# Patient Record
Sex: Female | Born: 1955 | Race: White | Hispanic: No | Marital: Married | Smoking: Former smoker
Health system: Southern US, Community
[De-identification: ages and names within clinical notes are randomized; demographics above are authoritative.]

## PROBLEM LIST (undated history)

## (undated) DIAGNOSIS — T79A0XS Compartment syndrome, unspecified, sequela: Secondary | ICD-10-CM

## (undated) DIAGNOSIS — F419 Anxiety disorder, unspecified: Secondary | ICD-10-CM

## (undated) DIAGNOSIS — R1312 Dysphagia, oropharyngeal phase: Secondary | ICD-10-CM

## (undated) DIAGNOSIS — N183 Chronic kidney disease, stage 3 unspecified: Secondary | ICD-10-CM

## (undated) DIAGNOSIS — D62 Acute posthemorrhagic anemia: Secondary | ICD-10-CM

## (undated) DIAGNOSIS — F329 Major depressive disorder, single episode, unspecified: Secondary | ICD-10-CM

## (undated) DIAGNOSIS — D638 Anemia in other chronic diseases classified elsewhere: Secondary | ICD-10-CM

## (undated) DIAGNOSIS — E119 Type 2 diabetes mellitus without complications: Secondary | ICD-10-CM

## (undated) DIAGNOSIS — I959 Hypotension, unspecified: Secondary | ICD-10-CM

## (undated) DIAGNOSIS — F4310 Post-traumatic stress disorder, unspecified: Secondary | ICD-10-CM

## (undated) DIAGNOSIS — J449 Chronic obstructive pulmonary disease, unspecified: Secondary | ICD-10-CM

## (undated) DIAGNOSIS — A4902 Methicillin resistant Staphylococcus aureus infection, unspecified site: Secondary | ICD-10-CM

## (undated) DIAGNOSIS — L89609 Pressure ulcer of unspecified heel, unspecified stage: Secondary | ICD-10-CM

## (undated) DIAGNOSIS — E875 Hyperkalemia: Secondary | ICD-10-CM

## (undated) DIAGNOSIS — K219 Gastro-esophageal reflux disease without esophagitis: Secondary | ICD-10-CM

## (undated) DIAGNOSIS — J9620 Acute and chronic respiratory failure, unspecified whether with hypoxia or hypercapnia: Secondary | ICD-10-CM

## (undated) DIAGNOSIS — R001 Bradycardia, unspecified: Secondary | ICD-10-CM

## (undated) DIAGNOSIS — Z9911 Dependence on respirator [ventilator] status: Secondary | ICD-10-CM

## (undated) DIAGNOSIS — G061 Intraspinal abscess and granuloma: Secondary | ICD-10-CM

## (undated) DIAGNOSIS — J45909 Unspecified asthma, uncomplicated: Secondary | ICD-10-CM

## (undated) DIAGNOSIS — G8250 Quadriplegia, unspecified: Secondary | ICD-10-CM

## (undated) DIAGNOSIS — Z950 Presence of cardiac pacemaker: Secondary | ICD-10-CM

## (undated) HISTORY — PX: PEG TUBE PLACEMENT: SUR1034

## (undated) HISTORY — PX: TRACHEOSTOMY: SUR1362

---

## 2019-12-23 ENCOUNTER — Encounter (HOSPITAL_COMMUNITY): Payer: Self-pay

## 2019-12-23 ENCOUNTER — Inpatient Hospital Stay (HOSPITAL_COMMUNITY)
Admission: EM | Admit: 2019-12-23 | Discharge: 2020-01-07 | DRG: 314 | Disposition: A | Discharge status: Skilled Nursing Facility | Payer: Medicare PPO | Source: Skilled Nursing Facility | Attending: Internal Medicine | Admitting: Internal Medicine

## 2019-12-23 ENCOUNTER — Emergency Department (HOSPITAL_COMMUNITY): Payer: Medicare PPO

## 2019-12-23 DIAGNOSIS — G8250 Quadriplegia, unspecified: Secondary | ICD-10-CM | POA: Diagnosis present

## 2019-12-23 DIAGNOSIS — Z91040 Latex allergy status: Secondary | ICD-10-CM | POA: Diagnosis not present

## 2019-12-23 DIAGNOSIS — E11649 Type 2 diabetes mellitus with hypoglycemia without coma: Secondary | ICD-10-CM | POA: Diagnosis not present

## 2019-12-23 DIAGNOSIS — B379 Candidiasis, unspecified: Secondary | ICD-10-CM | POA: Diagnosis not present

## 2019-12-23 DIAGNOSIS — N390 Urinary tract infection, site not specified: Secondary | ICD-10-CM | POA: Diagnosis present

## 2019-12-23 DIAGNOSIS — B370 Candidal stomatitis: Secondary | ICD-10-CM | POA: Diagnosis present

## 2019-12-23 DIAGNOSIS — Z9181 History of falling: Secondary | ICD-10-CM

## 2019-12-23 DIAGNOSIS — A4151 Sepsis due to Escherichia coli [E. coli]: Secondary | ICD-10-CM | POA: Diagnosis present

## 2019-12-23 DIAGNOSIS — R1312 Dysphagia, oropharyngeal phase: Secondary | ICD-10-CM | POA: Diagnosis not present

## 2019-12-23 DIAGNOSIS — J9610 Chronic respiratory failure, unspecified whether with hypoxia or hypercapnia: Secondary | ICD-10-CM | POA: Diagnosis not present

## 2019-12-23 DIAGNOSIS — D649 Anemia, unspecified: Secondary | ICD-10-CM | POA: Diagnosis present

## 2019-12-23 DIAGNOSIS — Z950 Presence of cardiac pacemaker: Secondary | ICD-10-CM | POA: Diagnosis present

## 2019-12-23 DIAGNOSIS — D703 Neutropenia due to infection: Secondary | ICD-10-CM | POA: Diagnosis present

## 2019-12-23 DIAGNOSIS — Z20822 Contact with and (suspected) exposure to covid-19: Secondary | ICD-10-CM | POA: Diagnosis present

## 2019-12-23 DIAGNOSIS — G8251 Quadriplegia, C1-C4 complete: Secondary | ICD-10-CM | POA: Diagnosis not present

## 2019-12-23 DIAGNOSIS — J15212 Pneumonia due to Methicillin resistant Staphylococcus aureus: Secondary | ICD-10-CM | POA: Diagnosis present

## 2019-12-23 DIAGNOSIS — R601 Generalized edema: Secondary | ICD-10-CM | POA: Diagnosis not present

## 2019-12-23 DIAGNOSIS — Z931 Gastrostomy status: Secondary | ICD-10-CM

## 2019-12-23 DIAGNOSIS — Z79891 Long term (current) use of opiate analgesic: Secondary | ICD-10-CM

## 2019-12-23 DIAGNOSIS — B9562 Methicillin resistant Staphylococcus aureus infection as the cause of diseases classified elsewhere: Secondary | ICD-10-CM | POA: Diagnosis not present

## 2019-12-23 DIAGNOSIS — Z430 Encounter for attention to tracheostomy: Secondary | ICD-10-CM | POA: Diagnosis not present

## 2019-12-23 DIAGNOSIS — I82629 Acute embolism and thrombosis of deep veins of unspecified upper extremity: Secondary | ICD-10-CM

## 2019-12-23 DIAGNOSIS — Z1612 Extended spectrum beta lactamase (ESBL) resistance: Secondary | ICD-10-CM | POA: Diagnosis present

## 2019-12-23 DIAGNOSIS — Z5329 Procedure and treatment not carried out because of patient's decision for other reasons: Secondary | ICD-10-CM | POA: Diagnosis not present

## 2019-12-23 DIAGNOSIS — Y712 Prosthetic and other implants, materials and accessory cardiovascular devices associated with adverse incidents: Secondary | ICD-10-CM | POA: Diagnosis present

## 2019-12-23 DIAGNOSIS — Z8673 Personal history of transient ischemic attack (TIA), and cerebral infarction without residual deficits: Secondary | ICD-10-CM

## 2019-12-23 DIAGNOSIS — T82868A Thrombosis of vascular prosthetic devices, implants and grafts, initial encounter: Secondary | ICD-10-CM | POA: Diagnosis not present

## 2019-12-23 DIAGNOSIS — I82622 Acute embolism and thrombosis of deep veins of left upper extremity: Secondary | ICD-10-CM | POA: Diagnosis not present

## 2019-12-23 DIAGNOSIS — Y95 Nosocomial condition: Secondary | ICD-10-CM | POA: Diagnosis present

## 2019-12-23 DIAGNOSIS — Z87891 Personal history of nicotine dependence: Secondary | ICD-10-CM

## 2019-12-23 DIAGNOSIS — Z930 Tracheostomy status: Secondary | ICD-10-CM | POA: Diagnosis not present

## 2019-12-23 DIAGNOSIS — Z960 Presence of urogenital implants: Secondary | ICD-10-CM | POA: Diagnosis not present

## 2019-12-23 DIAGNOSIS — E870 Hyperosmolality and hypernatremia: Secondary | ICD-10-CM | POA: Diagnosis not present

## 2019-12-23 DIAGNOSIS — Z880 Allergy status to penicillin: Secondary | ICD-10-CM

## 2019-12-23 DIAGNOSIS — J9621 Acute and chronic respiratory failure with hypoxia: Secondary | ICD-10-CM | POA: Diagnosis present

## 2019-12-23 DIAGNOSIS — T80211A Bloodstream infection due to central venous catheter, initial encounter: Secondary | ICD-10-CM | POA: Diagnosis not present

## 2019-12-23 DIAGNOSIS — E0781 Sick-euthyroid syndrome: Secondary | ICD-10-CM | POA: Diagnosis present

## 2019-12-23 DIAGNOSIS — B9620 Unspecified Escherichia coli [E. coli] as the cause of diseases classified elsewhere: Secondary | ICD-10-CM | POA: Diagnosis not present

## 2019-12-23 DIAGNOSIS — Y92230 Patient room in hospital as the place of occurrence of the external cause: Secondary | ICD-10-CM | POA: Diagnosis not present

## 2019-12-23 DIAGNOSIS — Z515 Encounter for palliative care: Secondary | ICD-10-CM

## 2019-12-23 DIAGNOSIS — E1122 Type 2 diabetes mellitus with diabetic chronic kidney disease: Secondary | ICD-10-CM | POA: Diagnosis present

## 2019-12-23 DIAGNOSIS — L89623 Pressure ulcer of left heel, stage 3: Secondary | ICD-10-CM | POA: Diagnosis present

## 2019-12-23 DIAGNOSIS — K219 Gastro-esophageal reflux disease without esophagitis: Secondary | ICD-10-CM | POA: Diagnosis present

## 2019-12-23 DIAGNOSIS — Z8619 Personal history of other infectious and parasitic diseases: Secondary | ICD-10-CM

## 2019-12-23 DIAGNOSIS — E039 Hypothyroidism, unspecified: Secondary | ICD-10-CM | POA: Diagnosis present

## 2019-12-23 DIAGNOSIS — A4102 Sepsis due to Methicillin resistant Staphylococcus aureus: Secondary | ICD-10-CM | POA: Diagnosis present

## 2019-12-23 DIAGNOSIS — Z8661 Personal history of infections of the central nervous system: Secondary | ICD-10-CM

## 2019-12-23 DIAGNOSIS — R8271 Bacteriuria: Secondary | ICD-10-CM | POA: Diagnosis not present

## 2019-12-23 DIAGNOSIS — K0889 Other specified disorders of teeth and supporting structures: Secondary | ICD-10-CM | POA: Diagnosis present

## 2019-12-23 DIAGNOSIS — B377 Candidal sepsis: Secondary | ICD-10-CM | POA: Diagnosis not present

## 2019-12-23 DIAGNOSIS — N183 Chronic kidney disease, stage 3 unspecified: Secondary | ICD-10-CM | POA: Diagnosis present

## 2019-12-23 DIAGNOSIS — R7881 Bacteremia: Secondary | ICD-10-CM | POA: Diagnosis not present

## 2019-12-23 DIAGNOSIS — J440 Chronic obstructive pulmonary disease with acute lower respiratory infection: Secondary | ICD-10-CM | POA: Diagnosis present

## 2019-12-23 DIAGNOSIS — E119 Type 2 diabetes mellitus without complications: Secondary | ICD-10-CM | POA: Diagnosis not present

## 2019-12-23 DIAGNOSIS — R609 Edema, unspecified: Secondary | ICD-10-CM

## 2019-12-23 DIAGNOSIS — G061 Intraspinal abscess and granuloma: Secondary | ICD-10-CM | POA: Diagnosis not present

## 2019-12-23 DIAGNOSIS — R600 Localized edema: Secondary | ICD-10-CM | POA: Diagnosis present

## 2019-12-23 DIAGNOSIS — D6489 Other specified anemias: Secondary | ICD-10-CM | POA: Diagnosis present

## 2019-12-23 DIAGNOSIS — F419 Anxiety disorder, unspecified: Secondary | ICD-10-CM | POA: Diagnosis not present

## 2019-12-23 DIAGNOSIS — Z7951 Long term (current) use of inhaled steroids: Secondary | ICD-10-CM

## 2019-12-23 DIAGNOSIS — L989 Disorder of the skin and subcutaneous tissue, unspecified: Secondary | ICD-10-CM | POA: Diagnosis present

## 2019-12-23 DIAGNOSIS — J9601 Acute respiratory failure with hypoxia: Secondary | ICD-10-CM | POA: Diagnosis not present

## 2019-12-23 DIAGNOSIS — F4310 Post-traumatic stress disorder, unspecified: Secondary | ICD-10-CM | POA: Diagnosis present

## 2019-12-23 DIAGNOSIS — F329 Major depressive disorder, single episode, unspecified: Secondary | ICD-10-CM | POA: Diagnosis present

## 2019-12-23 DIAGNOSIS — A499 Bacterial infection, unspecified: Secondary | ICD-10-CM | POA: Diagnosis not present

## 2019-12-23 DIAGNOSIS — Z7401 Bed confinement status: Secondary | ICD-10-CM

## 2019-12-23 DIAGNOSIS — I5033 Acute on chronic diastolic (congestive) heart failure: Secondary | ICD-10-CM | POA: Diagnosis present

## 2019-12-23 DIAGNOSIS — J9611 Chronic respiratory failure with hypoxia: Secondary | ICD-10-CM | POA: Diagnosis not present

## 2019-12-23 DIAGNOSIS — M7989 Other specified soft tissue disorders: Secondary | ICD-10-CM | POA: Diagnosis not present

## 2019-12-23 DIAGNOSIS — N179 Acute kidney failure, unspecified: Secondary | ICD-10-CM | POA: Diagnosis present

## 2019-12-23 DIAGNOSIS — Z751 Person awaiting admission to adequate facility elsewhere: Secondary | ICD-10-CM

## 2019-12-23 DIAGNOSIS — A4159 Other Gram-negative sepsis: Secondary | ICD-10-CM | POA: Diagnosis present

## 2019-12-23 DIAGNOSIS — E46 Unspecified protein-calorie malnutrition: Secondary | ICD-10-CM | POA: Diagnosis present

## 2019-12-23 DIAGNOSIS — Z794 Long term (current) use of insulin: Secondary | ICD-10-CM

## 2019-12-23 DIAGNOSIS — Z981 Arthrodesis status: Secondary | ICD-10-CM | POA: Diagnosis not present

## 2019-12-23 DIAGNOSIS — R0600 Dyspnea, unspecified: Secondary | ICD-10-CM | POA: Diagnosis not present

## 2019-12-23 DIAGNOSIS — Z9911 Dependence on respirator [ventilator] status: Secondary | ICD-10-CM

## 2019-12-23 DIAGNOSIS — Z79899 Other long term (current) drug therapy: Secondary | ICD-10-CM

## 2019-12-23 DIAGNOSIS — Z95828 Presence of other vascular implants and grafts: Secondary | ICD-10-CM | POA: Diagnosis not present

## 2019-12-23 DIAGNOSIS — E1165 Type 2 diabetes mellitus with hyperglycemia: Secondary | ICD-10-CM

## 2019-12-23 DIAGNOSIS — T801XXA Vascular complications following infusion, transfusion and therapeutic injection, initial encounter: Secondary | ICD-10-CM

## 2019-12-23 DIAGNOSIS — D709 Neutropenia, unspecified: Secondary | ICD-10-CM | POA: Diagnosis present

## 2019-12-23 DIAGNOSIS — I493 Ventricular premature depolarization: Secondary | ICD-10-CM | POA: Diagnosis present

## 2019-12-23 DIAGNOSIS — Z91048 Other nonmedicinal substance allergy status: Secondary | ICD-10-CM

## 2019-12-23 DIAGNOSIS — R0989 Other specified symptoms and signs involving the circulatory and respiratory systems: Secondary | ICD-10-CM

## 2019-12-23 DIAGNOSIS — G8220 Paraplegia, unspecified: Secondary | ICD-10-CM | POA: Diagnosis not present

## 2019-12-23 DIAGNOSIS — Z885 Allergy status to narcotic agent status: Secondary | ICD-10-CM

## 2019-12-23 DIAGNOSIS — Z4659 Encounter for fitting and adjustment of other gastrointestinal appliance and device: Secondary | ICD-10-CM

## 2019-12-23 DIAGNOSIS — R680 Hypothermia, not associated with low environmental temperature: Secondary | ICD-10-CM | POA: Diagnosis not present

## 2019-12-23 DIAGNOSIS — Z887 Allergy status to serum and vaccine status: Secondary | ICD-10-CM

## 2019-12-23 DIAGNOSIS — D72819 Decreased white blood cell count, unspecified: Secondary | ICD-10-CM | POA: Diagnosis not present

## 2019-12-23 DIAGNOSIS — D759 Disease of blood and blood-forming organs, unspecified: Secondary | ICD-10-CM

## 2019-12-23 HISTORY — DX: Dependence on respirator (ventilator) status: Z99.11

## 2019-12-23 HISTORY — DX: Acute and chronic respiratory failure, unspecified whether with hypoxia or hypercapnia: J96.20

## 2019-12-23 HISTORY — DX: Pressure ulcer of unspecified heel, unspecified stage: L89.609

## 2019-12-23 HISTORY — DX: Anxiety disorder, unspecified: F41.9

## 2019-12-23 HISTORY — DX: Bradycardia, unspecified: R00.1

## 2019-12-23 HISTORY — DX: Anemia in other chronic diseases classified elsewhere: D63.8

## 2019-12-23 HISTORY — DX: Chronic kidney disease, stage 3 unspecified: N18.30

## 2019-12-23 HISTORY — DX: Unspecified asthma, uncomplicated: J45.909

## 2019-12-23 HISTORY — DX: Chronic obstructive pulmonary disease, unspecified: J44.9

## 2019-12-23 HISTORY — DX: Dysphagia, oropharyngeal phase: R13.12

## 2019-12-23 HISTORY — DX: Quadriplegia, unspecified: G82.50

## 2019-12-23 HISTORY — DX: Intraspinal abscess and granuloma: G06.1

## 2019-12-23 HISTORY — DX: Compartment syndrome, unspecified, sequela: T79.A0XS

## 2019-12-23 HISTORY — DX: Post-traumatic stress disorder, unspecified: F43.10

## 2019-12-23 HISTORY — DX: Methicillin resistant Staphylococcus aureus infection, unspecified site: A49.02

## 2019-12-23 HISTORY — DX: Type 2 diabetes mellitus without complications: E11.9

## 2019-12-23 HISTORY — DX: Gastro-esophageal reflux disease without esophagitis: K21.9

## 2019-12-23 HISTORY — DX: Hypotension, unspecified: I95.9

## 2019-12-23 HISTORY — DX: Presence of cardiac pacemaker: Z95.0

## 2019-12-23 HISTORY — DX: Hyperkalemia: E87.5

## 2019-12-23 HISTORY — DX: Major depressive disorder, single episode, unspecified: F32.9

## 2019-12-23 HISTORY — DX: Acute posthemorrhagic anemia: D62

## 2019-12-23 LAB — PROTIME-INR
INR: 1.2 (ref 0.8–1.2)
Prothrombin Time: 15.1 seconds (ref 11.4–15.2)

## 2019-12-23 LAB — CBC WITH DIFFERENTIAL/PLATELET
Abs Immature Granulocytes: 0 10*3/uL (ref 0.00–0.07)
Basophils Absolute: 0 10*3/uL (ref 0.0–0.1)
Basophils Relative: 2 %
Eosinophils Absolute: 0.3 10*3/uL (ref 0.0–0.5)
Eosinophils Relative: 40 %
HCT: 20.9 % — ABNORMAL LOW (ref 36.0–46.0)
Hemoglobin: 6.2 g/dL — CL (ref 12.0–15.0)
Lymphocytes Relative: 58 %
Lymphs Abs: 0.5 10*3/uL — ABNORMAL LOW (ref 0.7–4.0)
MCH: 29.7 pg (ref 26.0–34.0)
MCHC: 29.7 g/dL — ABNORMAL LOW (ref 30.0–36.0)
MCV: 100 fL (ref 80.0–100.0)
Monocytes Absolute: 0 10*3/uL — ABNORMAL LOW (ref 0.1–1.0)
Monocytes Relative: 0 %
Neutro Abs: 0 10*3/uL — ABNORMAL LOW (ref 1.7–7.7)
Neutrophils Relative %: 0 %
Platelets: 255 10*3/uL (ref 150–400)
RBC: 2.09 MIL/uL — ABNORMAL LOW (ref 3.87–5.11)
RDW: 18.6 % — ABNORMAL HIGH (ref 11.5–15.5)
WBC: 0.8 10*3/uL — CL (ref 4.0–10.5)
nRBC: 0 % (ref 0.0–0.2)
nRBC: 2 /100 WBC — ABNORMAL HIGH

## 2019-12-23 LAB — CBG MONITORING, ED: Glucose-Capillary: 99 mg/dL (ref 70–99)

## 2019-12-23 LAB — URINALYSIS, ROUTINE W REFLEX MICROSCOPIC
Bilirubin Urine: NEGATIVE
Glucose, UA: NEGATIVE mg/dL
Hgb urine dipstick: NEGATIVE
Ketones, ur: NEGATIVE mg/dL
Leukocytes,Ua: NEGATIVE
Nitrite: NEGATIVE
Protein, ur: 100 mg/dL — AB
Specific Gravity, Urine: 1.015 (ref 1.005–1.030)
pH: 9 — ABNORMAL HIGH (ref 5.0–8.0)

## 2019-12-23 LAB — POCT I-STAT 7, (LYTES, BLD GAS, ICA,H+H)
Acid-Base Excess: 6 mmol/L — ABNORMAL HIGH (ref 0.0–2.0)
Bicarbonate: 30.7 mmol/L — ABNORMAL HIGH (ref 20.0–28.0)
Calcium, Ion: 1.28 mmol/L (ref 1.15–1.40)
HCT: 23 % — ABNORMAL LOW (ref 36.0–46.0)
Hemoglobin: 7.8 g/dL — ABNORMAL LOW (ref 12.0–15.0)
O2 Saturation: 96 %
Patient temperature: 98.3
Potassium: 4.9 mmol/L (ref 3.5–5.1)
Sodium: 144 mmol/L (ref 135–145)
TCO2: 32 mmol/L (ref 22–32)
pCO2 arterial: 43.6 mmHg (ref 32.0–48.0)
pH, Arterial: 7.455 — ABNORMAL HIGH (ref 7.350–7.450)
pO2, Arterial: 80 mmHg — ABNORMAL LOW (ref 83.0–108.0)

## 2019-12-23 LAB — COMPREHENSIVE METABOLIC PANEL
ALT: 40 U/L (ref 0–44)
AST: 16 U/L (ref 15–41)
Albumin: 1.2 g/dL — ABNORMAL LOW (ref 3.5–5.0)
Alkaline Phosphatase: 247 U/L — ABNORMAL HIGH (ref 38–126)
Anion gap: 6 (ref 5–15)
BUN: 69 mg/dL — ABNORMAL HIGH (ref 8–23)
CO2: 29 mmol/L (ref 22–32)
Calcium: 8.6 mg/dL — ABNORMAL LOW (ref 8.9–10.3)
Chloride: 107 mmol/L (ref 98–111)
Creatinine, Ser: 1.24 mg/dL — ABNORMAL HIGH (ref 0.44–1.00)
GFR calc Af Amer: 53 mL/min — ABNORMAL LOW (ref >60)
GFR calc non Af Amer: 46 mL/min — ABNORMAL LOW (ref >60)
Glucose, Bld: 177 mg/dL — ABNORMAL HIGH (ref 70–99)
Potassium: 4.6 mmol/L (ref 3.5–5.1)
Sodium: 142 mmol/L (ref 135–145)
Total Bilirubin: 0.6 mg/dL (ref 0.3–1.2)
Total Protein: 6.8 g/dL (ref 6.5–8.1)

## 2019-12-23 LAB — PREPARE RBC (CROSSMATCH)

## 2019-12-23 LAB — T4, FREE: Free T4: 0.66 ng/dL (ref 0.61–1.12)

## 2019-12-23 LAB — RESPIRATORY PANEL BY RT PCR (FLU A&B, COVID)
Influenza A by PCR: NEGATIVE
Influenza B by PCR: NEGATIVE
SARS Coronavirus 2 by RT PCR: NEGATIVE

## 2019-12-23 LAB — TSH: TSH: 24.573 u[IU]/mL — ABNORMAL HIGH (ref 0.350–4.500)

## 2019-12-23 LAB — POC SARS CORONAVIRUS 2 AG -  ED: SARS Coronavirus 2 Ag: NEGATIVE

## 2019-12-23 LAB — APTT: aPTT: 36 seconds (ref 24–36)

## 2019-12-23 LAB — TROPONIN I (HIGH SENSITIVITY): Troponin I (High Sensitivity): 5 ng/L (ref <18)

## 2019-12-23 LAB — LACTIC ACID, PLASMA: Lactic Acid, Venous: 1 mmol/L (ref 0.5–1.9)

## 2019-12-23 LAB — POC OCCULT BLOOD, ED: Fecal Occult Bld: NEGATIVE

## 2019-12-23 MED ORDER — SODIUM CHLORIDE 0.9% IV SOLUTION: Freq: Once | INTRAVENOUS | Status: DC

## 2019-12-23 MED ORDER — INSULIN ASPART 100 UNIT/ML ~~LOC~~ SOLN
0.0000 [IU] | SUBCUTANEOUS | Status: DC
  Administered 2019-12-26 (×3): 2 [IU] via SUBCUTANEOUS
  Administered 2019-12-26: 1 [IU] via SUBCUTANEOUS
  Administered 2019-12-26 (×2): 2 [IU] via SUBCUTANEOUS
  Administered 2019-12-26: 1 [IU] via SUBCUTANEOUS
  Administered 2019-12-27: 2 [IU] via SUBCUTANEOUS
  Administered 2019-12-27: 3 [IU] via SUBCUTANEOUS
  Administered 2019-12-27: 2 [IU] via SUBCUTANEOUS
  Administered 2019-12-27: 3 [IU] via SUBCUTANEOUS
  Administered 2019-12-27: 2 [IU] via SUBCUTANEOUS
  Administered 2019-12-28 (×3): 3 [IU] via SUBCUTANEOUS

## 2019-12-23 MED ORDER — CHLORHEXIDINE GLUCONATE 0.12% ORAL RINSE (MEDLINE KIT)
15.0000 mL | Freq: Two times a day (BID) | OROMUCOSAL | Status: DC
  Administered 2019-12-24 – 2020-01-07 (×28): 15 mL via OROMUCOSAL

## 2019-12-23 MED ORDER — INSULIN ASPART 100 UNIT/ML ~~LOC~~ SOLN: 0.0000 [IU] | Freq: Every day | SUBCUTANEOUS | Status: DC

## 2019-12-23 MED ORDER — SODIUM CHLORIDE 0.9 % IV SOLN
2.0000 g | Freq: Once | INTRAVENOUS | Status: AC
  Administered 2019-12-23: 2 g via INTRAVENOUS
  Filled 2019-12-23: qty 2

## 2019-12-23 MED ORDER — ACETAMINOPHEN 650 MG RE SUPP: 650.0000 mg | Freq: Four times a day (QID) | RECTAL | Status: DC | PRN

## 2019-12-23 MED ORDER — PANTOPRAZOLE SODIUM 40 MG PO PACK
40.0000 mg | PACK | Freq: Every day | ORAL | Status: DC
  Administered 2019-12-24 – 2020-01-07 (×15): 40 mg
  Filled 2019-12-23 (×16): qty 20

## 2019-12-23 MED ORDER — NYSTATIN 100000 UNIT/ML MT SUSP
5.0000 mL | Freq: Three times a day (TID) | OROMUCOSAL | Status: DC
  Administered 2019-12-24 – 2020-01-05 (×48): 500000 [IU] via ORAL
  Filled 2019-12-23 (×48): qty 5

## 2019-12-23 MED ORDER — INSULIN ASPART 100 UNIT/ML ~~LOC~~ SOLN: 0.0000 [IU] | Freq: Three times a day (TID) | SUBCUTANEOUS | Status: DC

## 2019-12-23 MED ORDER — FUROSEMIDE 10 MG/ML IJ SOLN
20.0000 mg | Freq: Once | INTRAMUSCULAR | Status: AC
  Administered 2019-12-24: 20 mg via INTRAVENOUS
  Filled 2019-12-23: qty 2

## 2019-12-23 MED ORDER — IPRATROPIUM-ALBUTEROL 0.5-2.5 (3) MG/3ML IN SOLN
3.0000 mL | Freq: Four times a day (QID) | RESPIRATORY_TRACT | Status: DC | PRN
  Administered 2019-12-27: 3 mL via RESPIRATORY_TRACT
  Filled 2019-12-23 (×3): qty 3

## 2019-12-23 MED ORDER — ORAL CARE MOUTH RINSE
15.0000 mL | OROMUCOSAL | Status: DC
  Administered 2019-12-24 – 2020-01-07 (×128): 15 mL via OROMUCOSAL

## 2019-12-23 MED ORDER — ACETAMINOPHEN 325 MG PO TABS
650.0000 mg | ORAL_TABLET | Freq: Four times a day (QID) | ORAL | Status: DC | PRN
  Administered 2019-12-24 – 2020-01-03 (×8): 650 mg via ORAL
  Filled 2019-12-23 (×8): qty 2

## 2019-12-23 NOTE — Progress Notes (Signed)
Code Sepsis monitoring discontinued due to cancellation.  

## 2019-12-23 NOTE — Progress Notes (Signed)
Spoke to [employer] (daughter) to give update. Daughter would also like to be listed as pts emergency contact.   Gerri Spore [phone] Darcel Bayley [phone]

## 2019-12-23 NOTE — Consult Note (Addendum)
NAME:  Gabrielle Wade, MRN:  [id number], DOB:  02/18/1956, LOS: 0 ADMISSION DATE:  12/23/2019, CONSULTATION DATE:  12/22/2018 REFERRING MD:  Dr. 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  PCCM consulted for chronic trach and vent management.   History of present illness   HPI obtained from medical chart review as patient is trach/ mechanical ventilator dependent.  No bedside medical records found, therefore limited HPI.   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  In ER, hemodynamically stable and initial temp 95.5.  Patient is awake in no distress on ventilator with settings PRVC, rate 12, FiO2 30%, TV 480, PEEP 5.  Unknown if patient weans.  Workup notable for WBC 0.8, Hgb 6.2, platelets 255, glucose 177, BUN 69, sCr 1.24, alk phos 247, albumin 1.2, corrected calcium 10.8, trop hs 5, lactic acid 1, normal coags, TSH 24.57, free T4 0.66, FOB negative, SARS2 PCR neg, UA negative.  CXR showing cardiomegaly with moderate bilateral pleural effusions, bilateral airspace and bibasilar opacities concerning for developing edema vs atypical infectious process vs atelectasis vs developing infiltrate.  TRH to admit, PCCM consulted for vent and tracheostomy management.   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 rate 16, SpO2 98 %.    Vent Mode: PRVC FiO2 (%):  [30 %] 30 % Set Rate:  [12 bmp] 12 bmp Vt Set:  [480 mL] 480 mL PEEP:  [5 cmH20] 5 cmH20 Plateau Pressure:  [22 cmH20] 22 cmH20  No intake or output data in the 24 hours ending 12/23/19 2020 There were no vitals filed for this visit.  Examination: General:  Chronically ill appearing female in NAD on MV HEENT: MM pink/moist, questionable oral thrush, midline shiley 6 cuffed Neuro: Awake, able nod to questions and mouths answers, no movement noted in extremities  CV: rr, no murmur PULM:  Non labored/ synchronous with MV, coarse throughout, more diminished on left, faint wheeze throughout  GI: soft, bs active, PEG LUQ, foley  Extremities: warm/dry, 2+ edema to BUE and BLE  Skin: severe dry skin   Resolved Hospital Problem list    Assessment & Plan:   Chronic respiratory failure with tracheostomy/ PEG secondary to prior cervical epidural abscess/ quadriplegia  Moderate bilateral pleural effusions HCAP +/- pulmonary edema P:  Full MV support, PRVC 8 cc/kg, rate 12 Wean Fio2 for sat goal > 92%- currently on minimal support Baseline  ABG Try daily SBT to assess ability but given quadriplegia will likely require some form of support  Intermittent CXR Follow pan-culture, send trach asp Pending urine strep/ legionella  Continue cefepime/ vanc Trend WBC/ fever curve  Will try to evaluate effusions at bedside with ultrasound, ideally, trial of diuresis first  Lasix  20 meq x 1 TTE/ CT chest ordered per primary Ongoing trach care per protocol  VAP bundle NPO   Remainder per primary.  PCCM will continue to follow.    Best practice:  Diet: NPO, per PEG Pain/Anxiety/Delirium protocol (if indicated): not needed VAP protocol (if indicated): yes DVT prophylaxis: SCDs GI prophylaxis: PPI Glucose control: CBG q 4, SSI Mobility: PT/ OT Code Status: full  Family Communication: per primary, patient updated on plan of care Disposition: admit TRH/ PCCM for chronic vent managment  Labs   CBC: Recent Labs  Lab 12/23/19 1604  WBC 0.8*  NEUTROABS 0.0*  HGB 6.2*  HCT 20.9*  MCV 100.0  PLT 096    Basic Metabolic Panel: Recent Labs  Lab 12/23/19 1604  NA 142  K 4.6  CL 107  CO2 29  GLUCOSE 177*  BUN 69*  CREATININE 1.24*  CALCIUM 8.6*   GFR: CrCl cannot be calculated (Unknown ideal weight.). Recent Labs  Lab 12/23/19 1604 12/23/19 1610  WBC 0.8*  --   LATICACIDVEN  --  1.0    Liver Function Tests: Recent Labs  Lab 12/23/19 1604  AST 16  ALT 40  ALKPHOS 247*  BILITOT 0.6  PROT 6.8  ALBUMIN 1.2*   No results for input(s): LIPASE, AMYLASE in the last 168 hours. No results for input(s): AMMONIA in the last 168 hours.  ABG No results found for: PHART, PCO2ART, PO2ART, HCO3, TCO2, ACIDBASEDEF, O2SAT   Coagulation Profile: Recent Labs  Lab 12/23/19 1623  INR 1.2    Cardiac Enzymes: No results for input(s): CKTOTAL, CKMB, CKMBINDEX, TROPONINI in the last 168 hours.  HbA1C: No results found for: HGBA1C  CBG: No results for input(s): GLUCAP in the last 168 hours.  Review of Systems:   Unable as patient is chronic trach/ vent   Past Medical History  She,  has a past medical history of Acute and chronic respiratory failure (acute-on-chronic) ([place]), Acute posthemorrhagic anemia, Anemia in other chronic diseases classified elsewhere, Anxiety, Anxiety, Asthma, Bradycardia, Chronic kidney disease, stage 3, Compartment  syndrome, unspecified, sequela, COPD (chronic obstructive pulmonary disease) ([place]), Dependence on respirator ([place]), Diabetes mellitus without complication ([place]), Dysphagia, oropharyngeal, GERD (gastroesophageal reflux disease), Hyperkalemia, Hypotension, Intraspinal abscess and granuloma, MDD (major depressive disorder), MRSA (methicillin resistant Staphylococcus aureus), Presence of cardiac pacemaker, Pressure ulcer of unspecified heel, unspecified stage, PTSD (post-traumatic stress disorder), and Quadriplegia ([place]).   Surgical History    Past Surgical History:  Procedure Laterality Date  . PEG TUBE PLACEMENT    . TRACHEOSTOMY       Social History   reports that she has quit smoking. She has never used smokeless tobacco. She reports previous alcohol use. She reports previous drug use.   Family History   Her family history is not on file.   Allergies Allergies  Allergen Reactions  . Codeine   . Hydrocodone   . Influenza A (H1n1) Monovalent Vaccine   . Latex   . Morphine And Related   . Penicillins   . Shellfish Allergy   . Tape      Home Medications  Prior to Admission medications   Not on File  Critical care time:  40 mins      Kennieth Rad, MSN, AGACNP-BC [facility] 12/23/2019, 10:05 PM

## 2019-12-23 NOTE — ED Notes (Signed)
[phone] pts daughter Gerri Spore

## 2019-12-23 NOTE — H&P (Signed)
TRH H&P    Patient Demographics:    Gabrielle Wade, is a 64 y.o. female  MRN: [id number]  DOB - 08/20/1956  Admit Date - 12/23/2019  Referring MD/NP/PA:   Era Bumpers  Outpatient Primary MD for the patient is Nona Dell, Corene Cornea, MD  Patient coming from:  [facility], previously at [facility] in [place]  Chief complaint-  Anemia,    HPI:    Gabrielle Wade  is a 64 y.o. female, w anxiety/ depression,PTSD,  hx of epidural abscess (mrsa), w quadriplegia , Dm2, CKD stage3,  Copd, Chronic Trach/ vent dependence, Gerd, chronic dysphagia, Anemia, presents with ? Thrombocytopenia, Hgb 6.8, and wbc 0.6 per [facility].   Pt notes slight dyspnea and dry cough, but unable to tell me how long this has been going on . Pt denies fever ,chills, cp, palp, n/v, diarrhea, brbpr, black stool.    In ED,  T 95.5, P 74 R 18, Bp 146/81  Pox 96% on 30% FIO2  CXR IMPRESSION: 1. Cardiomegaly with volume overload including moderate bilateral pleural effusions. 2. Scattered bilateral airspace opacities may be secondary to developing pulmonary edema or an atypical infectious process. 3. Bibasilar airspace opacities favored to represent atelectasis with an infiltrate not excluded.  Wbc 0.8, hgb 6.2, Plt 255 Na 142, K 4.6, Bun 69, Creatinine 1.24 Ast 16, Alt 40, Alk phos 247, T. Bili 0.6 TSH 24.573 INR 1.2 Urinalysis wbc 0-5, rbc 0-5, prot 100 Trop 5 FOBT negative Sars Covid PCR negative  Pt will be admitted for anemia, neutropenia, ? Pneumonia ? Early sepsis (hypothermia, leukopenia, low bp)    Review of systems:    In addition to the HPI above,  No Fever-chills, No Headache, No changes with Vision or hearing, No problems swallowing food or Liquids, No Chest pain  No Abdominal pain, No Nausea or Vomiting, bowel movements are regular, No Blood in stool or Urine, No dysuria, No new skin rashes or bruises, No new  joints pains-aches,  No new weakness, tingling, numbness in any extremity, No recent weight gain or loss, No polyuria, polydypsia or polyphagia, No significant Mental Stressors.  All other systems reviewed and are negative.    Past History of the following :    Past Medical History:  Diagnosis Date  . Acute and chronic respiratory failure (acute-on-chronic) ([place])   . Acute posthemorrhagic anemia   . Anemia in other chronic diseases classified elsewhere   . Anxiety   . Anxiety   . Asthma   . Bradycardia   . Chronic kidney disease, stage 3   . Compartment syndrome, unspecified, sequela   . COPD (chronic obstructive pulmonary disease) ([place])   . Dependence on respirator ([place])   . Diabetes mellitus without complication ([place])   . Dysphagia, oropharyngeal   . GERD (gastroesophageal reflux disease)   . Hyperkalemia   . Hypotension   . Intraspinal abscess and granuloma   . MDD (major depressive disorder)   . MRSA (methicillin resistant Staphylococcus aureus)   . Presence of cardiac pacemaker   . Pressure  ulcer of unspecified heel, unspecified stage   . PTSD (post-traumatic stress disorder)   . Quadriplegia [facility])       Past Surgical History:  Procedure Laterality Date  . PEG TUBE PLACEMENT    . TRACHEOSTOMY        Social History:      Social History   Tobacco Use  . Smoking status: Former Research scientist (life sciences)  . Smokeless tobacco: Never Used  Substance Use Topics  . Alcohol use: Not Currently       Family History :     Family History  Family history unknown: Yes       Home Medications:   Prior to Admission medications   Not on File     Allergies:     Allergies  Allergen Reactions  . Codeine   . Hydrocodone   . Influenza A (H1n1) Monovalent Vaccine   . Latex   . Morphine And Related   . Penicillins   . Shellfish Allergy   . Tape      Physical Exam:   Vitals  Blood pressure (!) 130/57, pulse 62, temperature 98.3 F (36.8 C), temperature source  Rectal, resp. rate 16, SpO2 98 %.  1.  General: axoxo3  2. Psychiatric: euthymic  3. Neurologic: quadriplegia  4. HEENMT:  Anicteric, pupils 1.72m symmetric, direct, consensual intact Neck: no jvd  5. Respiratory : Bibasilar crackles. No wheezing  6. Cardiovascular : rrr s1, s2, no m/g/r  7. Gastrointestinal:  Abd: soft, obese, nt, ns, +bs  8. Skin:  Ext: no c/c/e,  Wearing boot / heel protector on the left foot  9.Musculoskeletal:  Good ROM    Data Review:    CBC Recent Labs  Lab 12/23/19 1604  WBC 0.8*  HGB 6.2*  HCT 20.9*  PLT 255  MCV 100.0  MCH 29.7  MCHC 29.7*  RDW 18.6*  LYMPHSABS 0.5*  MONOABS 0.0*  EOSABS 0.3  BASOSABS 0.0   ------------------------------------------------------------------------------------------------------------------  Results for orders placed or performed during the hospital encounter of 12/23/19 (from the past 48 hour(s))  CBC with Differential     Status: Abnormal   Collection Time: 12/23/19  4:04 PM  Result Value Ref Range   WBC 0.8 (LL) 4.0 - 10.5 K/uL    Comment: REPEATED TO VERIFY THIS CRITICAL RESULT HAS VERIFIED AND BEEN CALLED TO T.YOUNG RN BY IMANI MANNING ON 01 04 2021 AT 1704, AND HAS BEEN READ BACK.     RBC 2.09 (L) 3.87 - 5.11 MIL/uL   Hemoglobin 6.2 (LL) 12.0 - 15.0 g/dL    Comment: REPEATED TO VERIFY THIS CRITICAL RESULT HAS VERIFIED AND BEEN CALLED TO T.YOUNG RN BY IMANI MANNING ON 01 04 2021 AT 1704, AND HAS BEEN READ BACK.     HCT 20.9 (L) 36.0 - 46.0 %   MCV 100.0 80.0 - 100.0 fL   MCH 29.7 26.0 - 34.0 pg   MCHC 29.7 (L) 30.0 - 36.0 g/dL   RDW 18.6 (H) 11.5 - 15.5 %   Platelets 255 150 - 400 K/uL    Comment: REPEATED TO VERIFY   nRBC 0.0 0.0 - 0.2 %   Neutrophils Relative % 0 %   Neutro Abs 0.0 (L) 1.7 - 7.7 K/uL   Lymphocytes Relative 58 %   Lymphs Abs 0.5 (L) 0.7 - 4.0 K/uL   Monocytes Relative 0 %   Monocytes Absolute 0.0 (L) 0.1 - 1.0 K/uL   Eosinophils Relative 40 %   Eosinophils  Absolute 0.3 0.0 -  0.5 K/uL   Basophils Relative 2 %   Basophils Absolute 0.0 0.0 - 0.1 K/uL   nRBC 2 (H) 0 /100 WBC   Abs Immature Granulocytes 0.00 0.00 - 0.07 K/uL   Polychromasia PRESENT     Comment: Performed at [facility] [address] [place], [place] [address]  Comprehensive metabolic panel     Status: Abnormal   Collection Time: 12/23/19  4:04 PM  Result Value Ref Range   Sodium 142 135 - 145 mmol/L   Potassium 4.6 3.5 - 5.1 mmol/L   Chloride 107 98 - 111 mmol/L   CO2 29 22 - 32 mmol/L   Glucose, Bld 177 (H) 70 - 99 mg/dL   BUN 69 (H) 8 - 23 mg/dL   Creatinine, Ser 1.24 (H) 0.44 - 1.00 mg/dL   Calcium 8.6 (L) 8.9 - 10.3 mg/dL   Total Protein 6.8 6.5 - 8.1 g/dL   Albumin 1.2 (L) 3.5 - 5.0 g/dL   AST 16 15 - 41 U/L   ALT 40 0 - 44 U/L   Alkaline Phosphatase 247 (H) 38 - 126 U/L   Total Bilirubin 0.6 0.3 - 1.2 mg/dL   GFR calc non Af Amer 46 (L) >60 mL/min   GFR calc Af Amer 53 (L) >60 mL/min   Anion gap 6 5 - 15    Comment: Performed at [facility] [address], [place], [place] [address]  T4, free     Status: None   Collection Time: 12/23/19  4:04 PM  Result Value Ref Range   Free T4 0.66 0.61 - 1.12 ng/dL    Comment: (NOTE) Biotin ingestion may interfere with free T4 tests. If the results are inconsistent with the TSH level, previous test results, or the clinical presentation, then consider biotin interference. If needed, order repeat testing after stopping biotin. Performed at [facility], [place] [address], [place], [place] [address]   Lactic acid, plasma     Status: None   Collection Time: 12/23/19  4:10 PM  Result Value Ref Range   Lactic Acid, Venous 1.0 0.5 - 1.9 mmol/L    Comment: Performed at [facility] [address] [place], [place] [address]  TSH     Status: Abnormal   Collection Time: 12/23/19  4:11 PM  Result Value Ref Range   TSH 24.573 (H) 0.350 - 4.500 uIU/mL    Comment: Performed by a 3rd Generation assay  with a functional sensitivity of <=0.01 uIU/mL. Performed at [facility], [place] [address] [place], [place] [address]   APTT     Status: None   Collection Time: 12/23/19  4:23 PM  Result Value Ref Range   aPTT 36 24 - 36 seconds    Comment: Performed at [facility] [address] [place], [place] [address]  Protime-INR     Status: None   Collection Time: 12/23/19  4:23 PM  Result Value Ref Range   Prothrombin Time 15.1 11.4 - 15.2 seconds   INR 1.2 0.8 - 1.2    Comment: (NOTE) INR goal varies based on device and disease states. Performed at [facility], [place] [address] [place], [place] [address]   Urinalysis, Routine w reflex microscopic     Status: Abnormal   Collection Time: 12/23/19  4:23 PM  Result Value Ref Range   Color, Urine YELLOW YELLOW   APPearance HAZY (A) CLEAR   Specific Gravity, Urine  1.015 1.005 - 1.030   pH 9.0 (H) 5.0 - 8.0   Glucose, UA NEGATIVE NEGATIVE mg/dL   Hgb urine dipstick NEGATIVE NEGATIVE   Bilirubin Urine NEGATIVE NEGATIVE   Ketones, ur NEGATIVE NEGATIVE mg/dL   Protein, ur 100 (A) NEGATIVE mg/dL   Nitrite NEGATIVE NEGATIVE   Leukocytes,Ua NEGATIVE NEGATIVE   RBC / HPF 0-5 0 - 5 RBC/hpf   WBC, UA 0-5 0 - 5 WBC/hpf   Bacteria, UA FEW (A) NONE SEEN   Squamous Epithelial / LPF 0-5 0 - 5   Mucus PRESENT    Amorphous Crystal PRESENT     Comment: Performed at [facility], [place] [address], [place], [place] [address]  Type and screen [place]     Status: None   Collection Time: 12/23/19  4:26 PM  Result Value Ref Range   ABO/RH(D) O POS    Antibody Screen POS    Sample Expiration      12/26/2019,2359 Performed at [facility], [place] [address] [place], [place] [address]   Respiratory Panel by RT PCR (Flu A&B, Covid) - Nasopharyngeal Swab     Status: None   Collection Time: 12/23/19  4:27 PM   Specimen: Nasopharyngeal Swab  Result Value Ref Range   SARS Coronavirus 2 by RT PCR NEGATIVE  NEGATIVE    Comment: (NOTE) SARS-CoV-2 target nucleic acids are NOT DETECTED. The SARS-CoV-2 RNA is generally detectable in upper respiratoy specimens during the acute phase of infection. The lowest concentration of SARS-CoV-2 viral copies this assay can detect is 131 copies/mL. A negative result does not preclude SARS-Cov-2 infection and should not be used as the sole basis for treatment or other patient management decisions. A negative result may occur with  improper specimen collection/handling, submission of specimen other than nasopharyngeal swab, presence of viral mutation(s) within the areas targeted by this assay, and inadequate number of viral copies (<131 copies/mL). A negative result must be combined with clinical observations, patient history, and epidemiological information. The expected result is Negative. Fact Sheet for Patients:  PinkCheek.be Fact Sheet for Healthcare Providers:  GravelBags.it This test is not yet ap proved or cleared by the [place] FDA and  has been authorized for detection and/or diagnosis of SARS-CoV-2 by FDA under an Emergency Use Authorization (EUA). This EUA will remain  in effect (meaning this test can be used) for the duration of the COVID-19 declaration under Section 564(b)(1) of the Act, 21 U.S.C. section 360bbb-3(b)(1), unless the authorization is terminated or revoked sooner.    Influenza A by PCR NEGATIVE NEGATIVE   Influenza B by PCR NEGATIVE NEGATIVE    Comment: (NOTE) The Xpert Xpress SARS-CoV-2/FLU/RSV assay is intended as an aid in  the diagnosis of influenza from Nasopharyngeal swab specimens and  should not be used as a sole basis for treatment. Nasal washings and  aspirates are unacceptable for Xpert Xpress SARS-CoV-2/FLU/RSV  testing. Fact Sheet for Patients: PinkCheek.be Fact Sheet for Healthcare  Providers: GravelBags.it This test is not yet approved or cleared by the [place] FDA and  has been authorized for detection and/or diagnosis of SARS-CoV-2 by  FDA under an Emergency Use Authorization (EUA). This EUA will remain  in effect (meaning this test can be used) for the duration of the  Covid-19 declaration under Section 564(b)(1) of the Act, 21  U.S.C. section 360bbb-3(b)(1), unless the authorization is  terminated or revoked. Performed at [facility], [place],  [place] [address]   POC occult blood, ED     Status: None   Collection Time: 12/23/19  4:39 PM  Result Value Ref Range   Fecal Occult Bld NEGATIVE NEGATIVE  POC SARS Coronavirus 2 Ag-ED - Nasal Swab (BD Veritor Kit)     Status: None   Collection Time: 12/23/19  5:09 PM  Result Value Ref Range   SARS Coronavirus 2 Ag NEGATIVE NEGATIVE    Comment: (NOTE) SARS-CoV-2 antigen NOT DETECTED.  Negative results are presumptive.  Negative results do not preclude SARS-CoV-2 infection and should not be used as the sole basis for treatment or other patient management decisions, including infection  control decisions, particularly in the presence of clinical signs and  symptoms consistent with COVID-19, or in those who have been in contact with the virus.  Negative results must be combined with clinical observations, patient history, and epidemiological information. The expected result is Negative. Fact Sheet for Patients: PodPark.tn Fact Sheet for Healthcare Providers: GiftContent.is This test is not yet approved or cleared by the [place] FDA and  has been authorized for detection and/or diagnosis of SARS-CoV-2 by FDA under an Emergency Use Authorization (EUA).  This EUA will remain in effect (meaning this test can be used) for the duration of  the COVID-19 de claration under Section 564(b)(1) of the Act,  21 U.S.C. section 360bbb-3(b)(1), unless the authorization is terminated or revoked sooner.   Troponin I (High Sensitivity)     Status: None   Collection Time: 12/23/19  5:15 PM  Result Value Ref Range   Troponin I (High Sensitivity) 5 <18 ng/L    Comment: (NOTE) Elevated high sensitivity troponin I (hsTnI) values and significant  changes across serial measurements may suggest ACS but many other  chronic and acute conditions are known to elevate hsTnI results.  Refer to the "Links" section for chest pain algorithms and additional  guidance. Performed at [facility], [place] [address] [place], [place] [address]     Chemistries  Recent Labs  Lab 12/23/19 1604  NA 142  K 4.6  CL 107  CO2 29  GLUCOSE 177*  BUN 69*  CREATININE 1.24*  CALCIUM 8.6*  AST 16  ALT 40  ALKPHOS 247*  BILITOT 0.6   ------------------------------------------------------------------------------------------------------------------  ------------------------------------------------------------------------------------------------------------------ GFR: CrCl cannot be calculated (Unknown ideal weight.). Liver Function Tests: Recent Labs  Lab 12/23/19 1604  AST 16  ALT 40  ALKPHOS 247*  BILITOT 0.6  PROT 6.8  ALBUMIN 1.2*   No results for input(s): LIPASE, AMYLASE in the last 168 hours. No results for input(s): AMMONIA in the last 168 hours. Coagulation Profile: Recent Labs  Lab 12/23/19 1623  INR 1.2   Cardiac Enzymes: No results for input(s): CKTOTAL, CKMB, CKMBINDEX, TROPONINI in the last 168 hours. BNP (last 3 results) No results for input(s): PROBNP in the last 8760 hours. HbA1C: No results for input(s): HGBA1C in the last 72 hours. CBG: No results for input(s): GLUCAP in the last 168 hours. Lipid Profile: No results for input(s): CHOL, HDL, LDLCALC, TRIG, CHOLHDL, LDLDIRECT in the last 72 hours. Thyroid Function Tests: Recent Labs    12/23/19 1604 12/23/19 1611  TSH   --  24.573*  FREET4 0.66  --    Anemia Panel: No results for input(s): VITAMINB12, FOLATE, FERRITIN, TIBC, IRON, RETICCTPCT in the last 72 hours.  --------------------------------------------------------------------------------------------------------------- Urine analysis:    Component Value Date/Time   COLORURINE YELLOW 12/23/2019 1623   APPEARANCEUR HAZY (A) 12/23/2019 1623  LABSPEC 1.015 12/23/2019 1623   PHURINE 9.0 (H) 12/23/2019 1623   GLUCOSEU NEGATIVE 12/23/2019 1623   HGBUR NEGATIVE 12/23/2019 1623   BILIRUBINUR NEGATIVE 12/23/2019 1623   KETONESUR NEGATIVE 12/23/2019 1623   PROTEINUR 100 (A) 12/23/2019 1623   NITRITE NEGATIVE 12/23/2019 1623   LEUKOCYTESUR NEGATIVE 12/23/2019 1623      Imaging Results:    DG Chest Port 1 View  Result Date: 12/23/2019 CLINICAL DATA:  Sepsis EXAM: PORTABLE CHEST 1 VIEW COMPARISON:  None. FINDINGS: There are moderate to large bilateral pleural effusions. The heart size is enlarged. There is scattered bilateral airspace opacities. There are prominent interstitial lung markings. There are dense bibasilar airspace opacities. There is no pneumothorax. There are old healed left-sided rib fractures. IMPRESSION: 1. Cardiomegaly with volume overload including moderate bilateral pleural effusions. 2. Scattered bilateral airspace opacities may be secondary to developing pulmonary edema or an atypical infectious process. 3. Bibasilar airspace opacities favored to represent atelectasis with an infiltrate not excluded. Electronically Signed   By: Constance Holster M.D.   On: 12/23/2019 17:25   nsr at 63, nl axis, prolonged pr, t inversion in v1,2, no st-t changes c/w sichemia   Assessment & Plan:    Principal Problem:   Anemia Active Problems:   Neutropenia (HCC)   Quadriplegia (HCC)   Diabetes (HCC)   Anemia Check ferritin, iron, tibc, b12, folate, spep, immunofixation Consider hematology consult in am Transfuse as per ED Check cbc in  am  Neutropenia Neutropenic precautions  ?Hcap Blood culture x2 Urine strep antigen Urine legionella antigen vanco iv, cefepime iv  Pleural effusion Check CT chest Check cardiac echo   Abnormal liver function Check GGT Check cmp in am  Hypothermia Bear hugger if needed  Hypothyroidism Please find out medication list in am and start levothyroxine or increase levothyroxine dose  Dm2 fsbs ac and qhs, ISS  Quadriplegia stable  GI prophylaxis protonix   I attempted to contact [facility] for medication list but could not get through, please address [facility] in AM  DVT Prophylaxis-   Lovenox - SCDs   AM Labs Ordered, also please review Full Orders  Family Communication: Admission, patients condition and plan of care including tests being ordered have been discussed with the patient  who indicate understanding and agree with the plan and Code Status.  Code Status:  FULL CODE per patient, notified daughter [name] that patient admitted , also notified her husband Hollice Espy  Admission status: Inpatient: Based on patients clinical presentation and evaluation of above clinical data, I have made determination that patient meets Inpatient criteria at this time.  Time spent in minutes : 70 minutes   Jani [place] M.D on 12/23/2019 at 8:44 PM

## 2019-12-23 NOTE — Progress Notes (Signed)
Cr Lab- HGB 6.2, WBC 0.8

## 2019-12-23 NOTE — ED Triage Notes (Signed)
Pt BIB [facility] for eval of gradual thrombocytopenia. [facility] reports white count of 0.6 and Hgb of 6.8. States has been gradually trending down, pt is relatively new to [facility] admitted there on 12/17/2020. [facility] denies reports of hematuria, melena or hematemesis. Pt is trach/vent dependent at baseline

## 2019-12-23 NOTE — ED Provider Notes (Signed)
[place] EMERGENCY DEPARTMENT Provider Note   CSN: [id number] Arrival date & time: 12/23/19  1539     History Chief Complaint  Patient presents with  . Thrombocytopenia    Gabrielle Wade is a 64 y.o. female.  HPI Gabrielle Wade is a 64 y.o. female with a medical history of stroke, epidural abscess, diabetes, trach and vent dependent, quadriplegia who presents to the ED for low white blood and hemoglobin counts.  She presents from [facility] facility as a trach and vent dependent patient who has been at her facility for the past month.  Facility reports her white blood count and hemoglobin have progressively decreased over the past month without a known cause.  Patient and facility deny any specific complaints such as fever, chest pain, shortness of breath, abdominal pain, new medications.  She is on her baseline home ventilator settings.  Staff deny infectious symptoms such as increased ventilator requirement, fever, cough, skin infection, urinary symptoms.  They report she is at her mental baseline. Patient unable to provide much history. She is trach/vent dependent. She sometimes nods yes or no to respond to questions.  Spoke with staff at facility today, she was admitted to the facility mid December, trach vent dependent.  She originally presented to the hospital 9/20 after having a stroke.  She fell while in the hospital, had a CT which then demonstrated a large cervical epidural abscess.  At some point from stroke or epidural abscess she became quadriplegic, trach and vent dependent.        Past Medical History:  Diagnosis Date  . Acute and chronic respiratory failure (acute-on-chronic) ([place])   . Acute posthemorrhagic anemia   . Anemia in other chronic diseases classified elsewhere   . Anxiety   . Anxiety   . Asthma   . Bradycardia   . Chronic kidney disease, stage 3   . Compartment syndrome, unspecified, sequela   . COPD (chronic obstructive pulmonary disease)  ([place])   . Dependence on respirator ([place])   . Diabetes mellitus without complication ([place])   . Dysphagia, oropharyngeal   . GERD (gastroesophageal reflux disease)   . Hyperkalemia   . Hypotension   . Intraspinal abscess and granuloma   . MDD (major depressive disorder)   . MRSA (methicillin resistant Staphylococcus aureus)   . Presence of cardiac pacemaker   . Pressure ulcer of unspecified heel, unspecified stage   . PTSD (post-traumatic stress disorder)   . Quadriplegia [facility])     Patient Active Problem List   Diagnosis Date Noted  . Anemia 12/23/2019  . Neutropenia ([place]) 12/23/2019  . Quadriplegia ([place]) 12/23/2019  . Diabetes ([place]) 12/23/2019    Past Surgical History:  Procedure Laterality Date  . PEG TUBE PLACEMENT    . TRACHEOSTOMY       OB History   No obstetric history on file.     Family History  Family history unknown: Yes    Social History   Tobacco Use  . Smoking status: Former Research scientist (life sciences)  . Smokeless tobacco: Never Used  Substance Use Topics  . Alcohol use: Not Currently  . Drug use: Not Currently    Home Medications Prior to Admission medications   Medication Sig Start Date End Date Taking? Authorizing Provider  budesonide (PULMICORT) 0.5 MG/2ML nebulizer solution Take 0.5 mg by nebulization 2 (two) times daily.   Yes [provider]  cephALEXin (KEFLEX) 500 MG capsule Take 500 mg by mouth 2 (two) times daily.   Yes [provider]  chlorhexidine (PERIDEX) 0.12 % solution Use as directed 15 mLs in the mouth or throat See admin instructions. By shift starting   Yes [provider]  famotidine (PEPCID) 20 MG tablet Take 20 mg by mouth 2 (two) times daily.   Yes [provider]  insulin lispro (HUMALOG) 100 UNIT/ML injection Inject 3-12 Units into the skin See admin instructions. < 60 or > 400 notify Dr; 150 - 200, 3 units; 201 - 250, 6 units; 251 - 300 8 units; 301 - 350, 12 units   Yes [provider]  insulin  NPH Human (NOVOLIN N) 100 UNIT/ML injection Inject 7 Units into the skin every 6 (six) hours.   Yes [provider]  ipratropium-albuterol (DUONEB) 0.5-2.5 (3) MG/3ML SOLN Take 3 mLs by nebulization every 4 (four) hours as needed (shortness of breath).   Yes [provider]  LORazepam (ATIVAN) 2 MG/ML concentrated solution Take 0.5 mg by mouth every 8 (eight) hours as needed for anxiety.   Yes [provider]  Nutritional Supplements (ISOSOURCE 1.5 CAL PO) Take 55 mL/hr by mouth See admin instructions. By shift starting   Yes [provider]  oxyCODONE-acetaminophen (PERCOCET/ROXICET) 5-325 MG tablet Take 1 tablet by mouth every 4 (four) hours as needed for severe pain.   Yes [provider]  traZODone (DESYREL) 50 MG tablet Take 50 mg by mouth at bedtime.   Yes [provider]    Allergies    Codeine, Hydrocodone, Influenza a (h1n1) monovalent vaccine, Latex, Morphine and related, Penicillins, Shellfish allergy, and Tape  Review of Systems   Review of Systems  Constitutional: Negative for fever.  HENT: Negative for congestion.   Eyes: Negative for visual disturbance.  Respiratory: Negative for cough and shortness of breath.   Cardiovascular: Negative for chest pain.  Gastrointestinal: Negative for abdominal pain and vomiting.  Genitourinary: Negative for dysuria and hematuria.  Musculoskeletal: Negative for back pain.  Skin: Negative for color change and rash.  Neurological: Negative for seizures and syncope.  All other systems reviewed and are negative.   Physical Exam Updated Vital Signs BP (!) 130/49   Pulse 60   Temp 98.1 F (36.7 C) (Oral)   Resp 14   SpO2 98%   Physical Exam Vitals and nursing note reviewed.  Constitutional:      General: She is in acute distress.     Appearance: She is well-developed. She is ill-appearing. She is not toxic-appearing.     Comments: Patient on trach, minimal ventilator settings, awake  but appears fatigued, chronically ill appearing, pale, unable to speak but can mouth words  HENT:     Head: Normocephalic and atraumatic.     Right Ear: External ear normal.     Left Ear: External ear normal.     Nose: Nose normal. No rhinorrhea.     Mouth/Throat:     Mouth: Mucous membranes are moist.  Eyes:     General:        Right eye: No discharge.        Left eye: No discharge.     Conjunctiva/sclera: Conjunctivae normal.  Cardiovascular:     Rate and Rhythm: Normal rate and regular rhythm.     Pulses: Normal pulses.     Heart sounds: Normal heart sounds. No murmur.  Pulmonary:     Effort: Pulmonary effort is normal. No respiratory distress.     Breath sounds: Normal breath sounds. No wheezing or rales.  Abdominal:  General: Abdomen is flat. There is no distension.     Palpations: Abdomen is soft.     Tenderness: There is no abdominal tenderness.  Musculoskeletal:        General: No deformity or signs of injury. Normal range of motion.     Cervical back: Normal range of motion and neck supple.  Skin:    General: Skin is warm and dry.     Capillary Refill: Capillary refill takes less than 2 seconds.     Coloration: Skin is not jaundiced.  Neurological:     Mental Status: She is alert. Mental status is at baseline.     Cranial Nerves: No cranial nerve deficit.     Motor: Weakness present.     Comments: Quadriplegic, can partially shrug both of her shoulders and can wiggle her thighs but otherwise 1/5 strength throughout all extremities  Psychiatric:        Mood and Affect: Mood normal.        Behavior: Behavior normal.     ED Results / Procedures / Treatments   Labs (all labs ordered are listed, but only abnormal results are displayed) Labs Reviewed  CBC WITH DIFFERENTIAL/PLATELET - Abnormal; Notable for the following components:      Result Value   WBC 0.8 (*)    RBC 2.09 (*)    Hemoglobin 6.2 (*)    HCT 20.9 (*)    MCHC 29.7 (*)    RDW 18.6 (*)    Neutro  Abs 0.0 (*)    Lymphs Abs 0.5 (*)    Monocytes Absolute 0.0 (*)    nRBC 2 (*)    All other components within normal limits  COMPREHENSIVE METABOLIC PANEL - Abnormal; Notable for the following components:   Glucose, Bld 177 (*)    BUN 69 (*)    Creatinine, Ser 1.24 (*)    Calcium 8.6 (*)    Albumin 1.2 (*)    Alkaline Phosphatase 247 (*)    GFR calc non Af Amer 46 (*)    GFR calc Af Amer 53 (*)    All other components within normal limits  TSH - Abnormal; Notable for the following components:   TSH 24.573 (*)    All other components within normal limits  URINALYSIS, ROUTINE W REFLEX MICROSCOPIC - Abnormal; Notable for the following components:   APPearance HAZY (*)    pH 9.0 (*)    Protein, ur 100 (*)    Bacteria, UA FEW (*)    All other components within normal limits  POCT I-STAT 7, (LYTES, BLD GAS, ICA,H+H) - Abnormal; Notable for the following components:   pH, Arterial 7.455 (*)    pO2, Arterial 80.0 (*)    Bicarbonate 30.7 (*)    Acid-Base Excess 6.0 (*)    HCT 23.0 (*)    Hemoglobin 7.8 (*)    All other components within normal limits  RESPIRATORY PANEL BY RT PCR (FLU A&B, COVID)  CULTURE, BLOOD (ROUTINE X 2)  CULTURE, BLOOD (ROUTINE X 2)  URINE CULTURE  CULTURE, RESPIRATORY  LACTIC ACID, PLASMA  T4, FREE  APTT  PROTIME-INR  HIV ANTIBODY (ROUTINE TESTING W REFLEX)  COMPREHENSIVE METABOLIC PANEL  CBC  FERRITIN  IRON AND TIBC  VITAMIN B12  FOLATE RBC  PROTEIN ELECTROPHORESIS, SERUM  UPEP/UIFE/LIGHT CHAINS/TP, 24-HR UR  URINALYSIS, ROUTINE W REFLEX MICROSCOPIC  BLOOD GAS, ARTERIAL  PROCALCITONIN  PROCALCITONIN  GAMMA GT  STREP PNEUMONIAE URINARY ANTIGEN  LEGIONELLA PNEUMOPHILA SEROGP 1 UR AG  POC OCCULT BLOOD, ED  POC SARS CORONAVIRUS 2 AG -  ED  CBG MONITORING, ED  TYPE AND SCREEN  PREPARE RBC (CROSSMATCH)  TROPONIN I (HIGH SENSITIVITY)  TROPONIN I (HIGH SENSITIVITY)    EKG EKG Interpretation  Date/Time:  Monday December 23 2019 16:04:34  EST Ventricular Rate:  64 PR Interval:    QRS Duration: 97 QT Interval:  428 QTC Calculation: 442 R Axis:   71 Text Interpretation: Sinus rhythm Ventricular premature complex No previous ECGs available Confirmed by Gareth Morgan [phone]) on 12/23/2019 7:03:23 PM   Radiology DG Chest Port 1 View  Result Date: 12/23/2019 CLINICAL DATA:  Sepsis EXAM: PORTABLE CHEST 1 VIEW COMPARISON:  None. FINDINGS: There are moderate to large bilateral pleural effusions. The heart size is enlarged. There is scattered bilateral airspace opacities. There are prominent interstitial lung markings. There are dense bibasilar airspace opacities. There is no pneumothorax. There are old healed left-sided rib fractures. IMPRESSION: 1. Cardiomegaly with volume overload including moderate bilateral pleural effusions. 2. Scattered bilateral airspace opacities may be secondary to developing pulmonary edema or an atypical infectious process. 3. Bibasilar airspace opacities favored to represent atelectasis with an infiltrate not excluded. Electronically Signed   By: Constance Holster M.D.   On: 12/23/2019 17:25    Procedures Procedures (including critical care time)  Medications Ordered in ED Medications  0.9 %  sodium chloride infusion (Manually program via Guardrails IV Fluids) ( Intravenous Hold 12/23/19 2340)  acetaminophen (TYLENOL) tablet 650 mg (has no administration in time range)    Or  acetaminophen (TYLENOL) suppository 650 mg (has no administration in time range)  chlorhexidine gluconate (MEDLINE KIT) (PERIDEX) 0.12 % solution 15 mL (15 mLs Mouth Rinse Not Given 12/23/19 2148)  MEDLINE mouth rinse (15 mLs Mouth Rinse Not Given 12/24/19 0132)  pantoprazole sodium (PROTONIX) 40 mg/20 mL oral suspension 40 mg (has no administration in time range)  insulin aspart (novoLOG) injection 0-9 Units (0 Units Subcutaneous Not Given 12/23/19 2340)  ipratropium-albuterol (DUONEB) 0.5-2.5 (3) MG/3ML nebulizer solution 3 mL (has no  administration in time range)  nystatin (MYCOSTATIN) 100000 UNIT/ML suspension 500,000 Units (500,000 Units Oral Given 12/24/19 0020)  ceFEPIme (MAXIPIME) 2 g in sodium chloride 0.9 % 100 mL IVPB (0 g Intravenous Stopped 12/23/19 2201)  furosemide (LASIX) injection 20 mg (20 mg Intravenous Given 12/24/19 0021)    ED Course  I have reviewed the triage vital signs and the nursing notes.  Pertinent labs & imaging results that were available during my care of the patient were reviewed by me and considered in my medical decision making (see chart for details).    MDM Rules/Calculators/A&P                      Gabrielle Wade is a 64 y.o. female with above medical history for progressively decreasing white blood count and hemoglobin over the past month. Presents from [facility] facility as a trach and vent dependent patient who has been at her facility for the past month.  Denies any pain, infectious symptoms, or signs of bleeding.  On baseline ventilator settings.  Facility reports she is at her mental baseline.  No new medicines.  She is quadriplegic after having a stroke and found to have an epidural abscess.  HPI and physical exam as above. Patient on trach, minimal ventilator settings, awake but appears fatigued, chronically ill appearing, pale, unable to speak but can mouth words, hemodynamically stable, mildly hypothermic. Lungs ctab. Abdomen, soft, non tender,  non peitonitic. Quadriplegic, can partially shrug both of her shoulders and can wiggle her thighs but otherwise 1/5 strength throughout all extremities.  Her laboratory work is concerning. She has anc of 0.0. Hgb of 6.2, normocytic (administered 1 unit prbc). AKI, likely pre-renal, bun is 69. Lactic acid is 1.0. Covid negative. Hemoccult negative. Cxr appears volume overload with scattered opacities.   She does not have medication on her list that appears to be potential cause for neutropenia or anemia. No focal source of infection, does not appear  septic. Will admit for neutropenia, amenia, aki.  I consulted medicine for admission, they have evaluated the patient and agree with plan for admission. I discussed this plan with the patient and family, they understand and agree with plan for admission.      Final Clinical Impression(s) / ED Diagnoses Final diagnoses:  None    Rx / DC Orders ED Discharge Orders    None       Phinehas Grounds, Lovena Le, MD 12/24/19 5072    Gareth Morgan, MD 12/26/19 1442

## 2019-12-23 NOTE — ED Notes (Signed)
Minimal Blood return on R sided midline. MD Schlossman aware. Pt w/ significant generalized edema, PIV that was in place on arrival was infiltrated w/ leakage around side, thus removed. MD Schlossman aware of inability to obtain bloodwork at this time

## 2019-12-24 ENCOUNTER — Inpatient Hospital Stay (HOSPITAL_COMMUNITY): Payer: Medicare PPO

## 2019-12-24 DIAGNOSIS — R1312 Dysphagia, oropharyngeal phase: Secondary | ICD-10-CM

## 2019-12-24 DIAGNOSIS — J449 Chronic obstructive pulmonary disease, unspecified: Secondary | ICD-10-CM

## 2019-12-24 DIAGNOSIS — B9562 Methicillin resistant Staphylococcus aureus infection as the cause of diseases classified elsewhere: Secondary | ICD-10-CM | POA: Diagnosis present

## 2019-12-24 DIAGNOSIS — J9611 Chronic respiratory failure with hypoxia: Secondary | ICD-10-CM

## 2019-12-24 DIAGNOSIS — D709 Neutropenia, unspecified: Secondary | ICD-10-CM

## 2019-12-24 DIAGNOSIS — Z87891 Personal history of nicotine dependence: Secondary | ICD-10-CM

## 2019-12-24 DIAGNOSIS — E1122 Type 2 diabetes mellitus with diabetic chronic kidney disease: Secondary | ICD-10-CM

## 2019-12-24 DIAGNOSIS — Z8614 Personal history of Methicillin resistant Staphylococcus aureus infection: Secondary | ICD-10-CM

## 2019-12-24 DIAGNOSIS — N183 Chronic kidney disease, stage 3 unspecified: Secondary | ICD-10-CM

## 2019-12-24 DIAGNOSIS — J9610 Chronic respiratory failure, unspecified whether with hypoxia or hypercapnia: Secondary | ICD-10-CM

## 2019-12-24 DIAGNOSIS — G8250 Quadriplegia, unspecified: Secondary | ICD-10-CM

## 2019-12-24 DIAGNOSIS — R600 Localized edema: Secondary | ICD-10-CM | POA: Diagnosis present

## 2019-12-24 DIAGNOSIS — Z91040 Latex allergy status: Secondary | ICD-10-CM

## 2019-12-24 DIAGNOSIS — K219 Gastro-esophageal reflux disease without esophagitis: Secondary | ICD-10-CM

## 2019-12-24 DIAGNOSIS — Z91048 Other nonmedicinal substance allergy status: Secondary | ICD-10-CM

## 2019-12-24 DIAGNOSIS — Z931 Gastrostomy status: Secondary | ICD-10-CM

## 2019-12-24 DIAGNOSIS — Z91013 Allergy to seafood: Secondary | ICD-10-CM

## 2019-12-24 DIAGNOSIS — Z880 Allergy status to penicillin: Secondary | ICD-10-CM

## 2019-12-24 DIAGNOSIS — R680 Hypothermia, not associated with low environmental temperature: Secondary | ICD-10-CM

## 2019-12-24 DIAGNOSIS — Z887 Allergy status to serum and vaccine status: Secondary | ICD-10-CM

## 2019-12-24 DIAGNOSIS — Z95828 Presence of other vascular implants and grafts: Secondary | ICD-10-CM

## 2019-12-24 DIAGNOSIS — B9620 Unspecified Escherichia coli [E. coli] as the cause of diseases classified elsewhere: Secondary | ICD-10-CM

## 2019-12-24 DIAGNOSIS — Z930 Tracheostomy status: Secondary | ICD-10-CM

## 2019-12-24 DIAGNOSIS — R0600 Dyspnea, unspecified: Secondary | ICD-10-CM

## 2019-12-24 DIAGNOSIS — R8271 Bacteriuria: Secondary | ICD-10-CM

## 2019-12-24 DIAGNOSIS — Z8673 Personal history of transient ischemic attack (TIA), and cerebral infarction without residual deficits: Secondary | ICD-10-CM

## 2019-12-24 DIAGNOSIS — Z9911 Dependence on respirator [ventilator] status: Secondary | ICD-10-CM

## 2019-12-24 DIAGNOSIS — D649 Anemia, unspecified: Secondary | ICD-10-CM

## 2019-12-24 DIAGNOSIS — Z885 Allergy status to narcotic agent status: Secondary | ICD-10-CM

## 2019-12-24 DIAGNOSIS — D72819 Decreased white blood cell count, unspecified: Secondary | ICD-10-CM

## 2019-12-24 DIAGNOSIS — R7881 Bacteremia: Secondary | ICD-10-CM | POA: Diagnosis present

## 2019-12-24 DIAGNOSIS — L89623 Pressure ulcer of left heel, stage 3: Secondary | ICD-10-CM | POA: Diagnosis present

## 2019-12-24 DIAGNOSIS — Z8739 Personal history of other diseases of the musculoskeletal system and connective tissue: Secondary | ICD-10-CM

## 2019-12-24 LAB — CBG MONITORING, ED
Glucose-Capillary: 104 mg/dL — ABNORMAL HIGH (ref 70–99)
Glucose-Capillary: 89 mg/dL (ref 70–99)
Glucose-Capillary: 93 mg/dL (ref 70–99)
Glucose-Capillary: 93 mg/dL (ref 70–99)

## 2019-12-24 LAB — COMPREHENSIVE METABOLIC PANEL
ALT: 38 U/L (ref 0–44)
AST: 17 U/L (ref 15–41)
Albumin: 1.3 g/dL — ABNORMAL LOW (ref 3.5–5.0)
Alkaline Phosphatase: 206 U/L — ABNORMAL HIGH (ref 38–126)
Anion gap: 7 (ref 5–15)
BUN: 70 mg/dL — ABNORMAL HIGH (ref 8–23)
CO2: 28 mmol/L (ref 22–32)
Calcium: 8.5 mg/dL — ABNORMAL LOW (ref 8.9–10.3)
Chloride: 106 mmol/L (ref 98–111)
Creatinine, Ser: 1.23 mg/dL — ABNORMAL HIGH (ref 0.44–1.00)
GFR calc Af Amer: 53 mL/min — ABNORMAL LOW (ref >60)
GFR calc non Af Amer: 46 mL/min — ABNORMAL LOW (ref >60)
Glucose, Bld: 108 mg/dL — ABNORMAL HIGH (ref 70–99)
Potassium: 4.8 mmol/L (ref 3.5–5.1)
Sodium: 141 mmol/L (ref 135–145)
Total Bilirubin: 0.7 mg/dL (ref 0.3–1.2)
Total Protein: 6.9 g/dL (ref 6.5–8.1)

## 2019-12-24 LAB — BLOOD CULTURE ID PANEL (REFLEXED)
Acinetobacter baumannii: NOT DETECTED
Candida albicans: NOT DETECTED
Candida glabrata: NOT DETECTED
Candida krusei: NOT DETECTED
Candida parapsilosis: NOT DETECTED
Candida tropicalis: NOT DETECTED
Carbapenem resistance: NOT DETECTED
Enterobacter cloacae complex: NOT DETECTED
Enterobacteriaceae species: DETECTED — AB
Enterococcus species: NOT DETECTED
Escherichia coli: DETECTED — AB
Haemophilus influenzae: NOT DETECTED
Klebsiella oxytoca: NOT DETECTED
Klebsiella pneumoniae: NOT DETECTED
Listeria monocytogenes: NOT DETECTED
Methicillin resistance: DETECTED — AB
Neisseria meningitidis: NOT DETECTED
Proteus species: NOT DETECTED
Pseudomonas aeruginosa: NOT DETECTED
Serratia marcescens: NOT DETECTED
Staphylococcus aureus (BCID): DETECTED — AB
Staphylococcus species: DETECTED — AB
Streptococcus agalactiae: NOT DETECTED
Streptococcus pneumoniae: NOT DETECTED
Streptococcus pyogenes: NOT DETECTED
Streptococcus species: NOT DETECTED

## 2019-12-24 LAB — CBC
HCT: 24.2 % — ABNORMAL LOW (ref 36.0–46.0)
Hemoglobin: 7.4 g/dL — ABNORMAL LOW (ref 12.0–15.0)
MCH: 30.2 pg (ref 26.0–34.0)
MCHC: 30.6 g/dL (ref 30.0–36.0)
MCV: 98.8 fL (ref 80.0–100.0)
Platelets: 245 10*3/uL (ref 150–400)
RBC: 2.45 MIL/uL — ABNORMAL LOW (ref 3.87–5.11)
RDW: 18.6 % — ABNORMAL HIGH (ref 11.5–15.5)
WBC: 0.7 10*3/uL — CL (ref 4.0–10.5)
nRBC: 0 % (ref 0.0–0.2)

## 2019-12-24 LAB — GLUCOSE, CAPILLARY
Glucose-Capillary: 92 mg/dL (ref 70–99)
Glucose-Capillary: 100 mg/dL — ABNORMAL HIGH (ref 70–99)

## 2019-12-24 LAB — TROPONIN I (HIGH SENSITIVITY): Troponin I (High Sensitivity): 9 ng/L (ref <18)

## 2019-12-24 LAB — IRON AND TIBC
Iron: 14 ug/dL — ABNORMAL LOW (ref 28–170)
Saturation Ratios: 12 % (ref 10.4–31.8)
TIBC: 116 ug/dL — ABNORMAL LOW (ref 250–450)
UIBC: 102 ug/dL

## 2019-12-24 LAB — MRSA PCR SCREENING: MRSA by PCR: POSITIVE — AB

## 2019-12-24 LAB — VITAMIN B12: Vitamin B-12: 551 pg/mL (ref 180–914)

## 2019-12-24 LAB — PROCALCITONIN: Procalcitonin: 0.32 ng/mL

## 2019-12-24 LAB — FERRITIN: Ferritin: 948 ng/mL — ABNORMAL HIGH (ref 11–307)

## 2019-12-24 LAB — LACTATE DEHYDROGENASE: LDH: 107 U/L (ref 98–192)

## 2019-12-24 LAB — ECHOCARDIOGRAM COMPLETE: Height: 70 in

## 2019-12-24 LAB — HIV ANTIBODY (ROUTINE TESTING W REFLEX): HIV Screen 4th Generation wRfx: NONREACTIVE

## 2019-12-24 LAB — STREP PNEUMONIAE URINARY ANTIGEN: Strep Pneumo Urinary Antigen: NEGATIVE

## 2019-12-24 LAB — SEDIMENTATION RATE: Sed Rate: 125 mm/hr — ABNORMAL HIGH (ref 0–22)

## 2019-12-24 LAB — GAMMA GT: GGT: 25 U/L (ref 7–50)

## 2019-12-24 MED ORDER — FLUCONAZOLE 40 MG/ML PO SUSR
200.0000 mg | Freq: Every day | ORAL | Status: DC
  Administered 2019-12-24 – 2019-12-25 (×2): 200 mg
  Filled 2019-12-24 (×4): qty 5

## 2019-12-24 MED ORDER — FLUCONAZOLE 40 MG/ML PO SUSR
200.0000 mg | Freq: Every day | ORAL | Status: DC
  Filled 2019-12-24: qty 5

## 2019-12-24 MED ORDER — MUPIROCIN 2 % EX OINT
1.0000 "application " | TOPICAL_OINTMENT | Freq: Two times a day (BID) | CUTANEOUS | Status: AC
  Administered 2019-12-24 – 2019-12-29 (×10): 1 via NASAL
  Filled 2019-12-24 (×2): qty 22

## 2019-12-24 MED ORDER — CHLORHEXIDINE GLUCONATE CLOTH 2 % EX PADS
6.0000 | MEDICATED_PAD | Freq: Every day | CUTANEOUS | Status: DC
  Administered 2019-12-24 – 2020-01-07 (×15): 6 via TOPICAL

## 2019-12-24 MED ORDER — TRAZODONE HCL 50 MG PO TABS
50.0000 mg | ORAL_TABLET | Freq: Every day | ORAL | Status: AC
  Administered 2019-12-24 – 2019-12-25 (×2): 50 mg via ORAL
  Filled 2019-12-24 (×2): qty 1

## 2019-12-24 MED ORDER — SODIUM CHLORIDE 0.9% FLUSH: 10.0000 mL | INTRAVENOUS | Status: DC | PRN

## 2019-12-24 MED ORDER — OXYCODONE-ACETAMINOPHEN 5-325 MG PO TABS
1.0000 | ORAL_TABLET | ORAL | Status: AC | PRN
  Administered 2019-12-24 – 2019-12-26 (×3): 1 via ORAL
  Filled 2019-12-24 (×3): qty 1

## 2019-12-24 MED ORDER — VANCOMYCIN HCL 1250 MG/250ML IV SOLN
1250.0000 mg | Freq: Once | INTRAVENOUS | Status: AC
  Administered 2019-12-24: 1250 mg via INTRAVENOUS
  Filled 2019-12-24: qty 250

## 2019-12-24 MED ORDER — SODIUM CHLORIDE 0.9 % IV SOLN
1.0000 g | Freq: Two times a day (BID) | INTRAVENOUS | Status: DC
  Administered 2019-12-24: 1 g via INTRAVENOUS
  Filled 2019-12-24 (×2): qty 1

## 2019-12-24 MED ORDER — VANCOMYCIN HCL IN DEXTROSE 1-5 GM/200ML-% IV SOLN
1000.0000 mg | INTRAVENOUS | Status: DC
  Administered 2019-12-26: 1000 mg via INTRAVENOUS
  Filled 2019-12-24: qty 200

## 2019-12-24 MED ORDER — SODIUM CHLORIDE 0.9% FLUSH
10.0000 mL | Freq: Two times a day (BID) | INTRAVENOUS | Status: DC
  Administered 2019-12-25 – 2020-01-07 (×15): 10 mL

## 2019-12-24 MED ORDER — SODIUM CHLORIDE 0.9 % IV SOLN: 2.0000 g | Freq: Two times a day (BID) | INTRAVENOUS | Status: DC

## 2019-12-24 MED ORDER — SODIUM CHLORIDE 0.9 % IV SOLN
1.0000 g | Freq: Three times a day (TID) | INTRAVENOUS | Status: DC
  Administered 2019-12-24 – 2019-12-25 (×2): 1 g via INTRAVENOUS
  Filled 2019-12-24 (×5): qty 1

## 2019-12-24 NOTE — Consult Note (Signed)
Chief Complaint: Patient was seen in consultation today for Bone marrow biopsy Chief Complaint  Patient presents with  . Thrombocytopenia   at the request of Dr Jana Hakim   Supervising Physician: Corrie Mckusick  Patient Status: [facility] - ED  History of Present Illness: Gabrielle Wade is a 64 y.o. 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 Melina Copa).  She does not see evidence of leukemia in the peripheral blood film.  There are increased eosinophils, and there are some platelet clumps indicating that the platelet count is actually higher than what is noted.  I do not see any obvious medications that could explain her pattern of cytopenias.  The patient has an elevated ferritin at 948 with iron saturation of 12%, and normal B12 at 551.  Folate and SPEP are pending.  I will add a reticulocyte count, DAT, ESR, and ANA.  However this is a very unusual presentation.  The patient's red cells are macrocytic.  Despite the high platelet count this may be a primary bone marrow issue and the patient will need a bone marrow biopsy.   Past Medical History:  Diagnosis Date  . Acute and chronic respiratory failure (acute-on-chronic) ([place])   . Acute posthemorrhagic anemia   . Anemia in other chronic diseases classified elsewhere   . Anxiety   . Anxiety   . Asthma   . Bradycardia   . Chronic kidney disease, stage 3   . Compartment syndrome, unspecified, sequela   . COPD (chronic obstructive pulmonary disease) ([place])   . Dependence on respirator ([place])   . Diabetes mellitus without complication  ([place])   . Dysphagia, oropharyngeal   . GERD (gastroesophageal reflux disease)   . Hyperkalemia   . Hypotension   . Intraspinal abscess and granuloma   . MDD (major depressive disorder)   . MRSA (methicillin resistant Staphylococcus aureus)   . Presence of cardiac pacemaker   . Pressure ulcer of unspecified heel, unspecified stage   . PTSD (post-traumatic stress disorder)   . Quadriplegia [facility])     Past Surgical History:  Procedure Laterality Date  . PEG TUBE PLACEMENT    . TRACHEOSTOMY      Allergies: Codeine, Hydrocodone, Influenza a (h1n1) monovalent vaccine, Latex, Morphine and related, Penicillins, Shellfish allergy, and Tape  Medications: Prior to Admission medications   Medication Sig Start Date End Date Taking? Authorizing Provider  budesonide (PULMICORT) 0.5 MG/2ML nebulizer solution Take 0.5 mg by nebulization 2 (two) times daily.   Yes [provider]  cephALEXin (KEFLEX) 500 MG capsule Take 500 mg by mouth 2 (two) times daily.   Yes [provider]  chlorhexidine (PERIDEX) 0.12 % solution Use as directed 15 mLs in the mouth or throat See admin instructions. By shift starting   Yes [provider]  famotidine (PEPCID) 20 MG tablet Take 20 mg by mouth 2 (two) times daily.   Yes [provider]  insulin lispro (HUMALOG) 100 UNIT/ML injection Inject 3-12 Units into the skin See admin instructions. < 60 or > 400 notify Dr; 150 - 200, 3 units;  201 - 250, 6 units; 251 - 300 8 units; 301 - 350, 12 units   Yes [provider]  insulin NPH Human (NOVOLIN N) 100 UNIT/ML injection Inject 7 Units into the skin every 6 (six) hours.   Yes [provider]  ipratropium-albuterol (DUONEB) 0.5-2.5 (3) MG/3ML SOLN Take 3 mLs by nebulization every 4 (four) hours as needed (shortness of breath).   Yes [provider]  LORazepam (ATIVAN) 2 MG/ML concentrated solution Take 0.5 mg by mouth every 8 (eight) hours as needed for anxiety.    Yes [provider]  Nutritional Supplements (ISOSOURCE 1.5 CAL PO) Take 55 mL/hr by mouth See admin instructions. By shift starting   Yes [provider]  oxyCODONE-acetaminophen (PERCOCET/ROXICET) 5-325 MG tablet Take 1 tablet by mouth every 4 (four) hours as needed for severe pain.   Yes [provider]  traZODone (DESYREL) 50 MG tablet Take 50 mg by mouth at bedtime.   Yes [provider]     Family History  Family history unknown: Yes    Social History   Socioeconomic History  . Marital status: Married    Spouse name: Not on file  . Number of children: Not on file  . Years of education: Not on file  . Highest education level: Not on file  Occupational History  . Not on file  Tobacco Use  . Smoking status: Former Smoker  . Smokeless tobacco: Never Used  Substance and Sexual Activity  . Alcohol use: Not Currently  . Drug use: Not Currently  . Sexual activity: Not on file  Other Topics Concern  . Not on file  Social History Narrative  . Not on file   Social Determinants of Health   Financial Resource Strain:   . Difficulty of Paying Living Expenses: Not on file  Food Insecurity:   . Worried About Running Out of Food in the Last Year: Not on file  . Ran Out of Food in the Last Year: Not on file  Transportation Needs:   . Lack of Transportation (Medical): Not on file  . Lack of Transportation (Non-Medical): Not on file  Physical Activity:   . Days of Exercise per Week: Not on file  . Minutes of Exercise per Session: Not on file  Stress:   . Feeling of Stress : Not on file  Social Connections:   . Frequency of Communication with Friends and Family: Not on file  . Frequency of Social Gatherings with Friends and Family: Not on file  . Attends Religious Services: Not on file  . Active Member of Clubs or Organizations: Not on file  . Attends Club or Organization Meetings: Not on file  . Marital Status: Not on file    Review of  Systems: A 12 point ROS discussed and pertinent positives are indicated in the HPI above.  All other systems are negative.   Vital Signs: BP 137/61   Pulse 66   Temp 98.5 F (36.9 C) (Oral)   Resp (!) 7   Ht 5' 10" (1.778 m)   SpO2 98%   Physical Exam Constitutional:      Comments: Vent but alert  Cardiovascular:     Rate and Rhythm: Normal rate and regular rhythm.     Heart sounds: Normal heart sounds.  Skin:    General: Skin is warm and dry.  Neurological:     Mental Status: She is alert. Mental status is at baseline.     Comments: Can nod   yes no and can mouth answers to most questions Has good understanding of BM Bx  Psychiatric:     Comments: Spoke to both Dtr [name] and Husband Leonard Both consent to procedure via phone     Imaging: DG Chest Port 1 View  Result Date: 12/23/2019 CLINICAL DATA:  Sepsis EXAM: PORTABLE CHEST 1 VIEW COMPARISON:  None. FINDINGS: There are moderate to large bilateral pleural effusions. The heart size is enlarged. There is scattered bilateral airspace opacities. There are prominent interstitial lung markings. There are dense bibasilar airspace opacities. There is no pneumothorax. There are old healed left-sided rib fractures. IMPRESSION: 1. Cardiomegaly with volume overload including moderate bilateral pleural effusions. 2. Scattered bilateral airspace opacities may be secondary to developing pulmonary edema or an atypical infectious process. 3. Bibasilar airspace opacities favored to represent atelectasis with an infiltrate not excluded. Electronically Signed   By: Christopher  Green M.D.   On: 12/23/2019 17:25    Labs:  CBC: Recent Labs    12/23/19 1604 12/23/19 2138 12/24/19 0232  WBC 0.8*  --  0.7*  HGB 6.2* 7.8* 7.4*  HCT 20.9* 23.0* 24.2*  PLT 255  --  245    COAGS: Recent Labs    12/23/19 1623  INR 1.2  APTT 36    BMP: Recent Labs    12/23/19 1604 12/23/19 2138 12/24/19 0232  NA 142 144 141  K 4.6 4.9 4.8  CL 107   --  106  CO2 29  --  28  GLUCOSE 177*  --  108*  BUN 69*  --  70*  CALCIUM 8.6*  --  8.5*  CREATININE 1.24*  --  1.23*  GFRNONAA 46*  --  46*  GFRAA 53*  --  53*    LIVER FUNCTION TESTS: Recent Labs    12/23/19 1604 12/24/19 0232  BILITOT 0.6 0.7  AST 16 17  ALT 40 38  ALKPHOS 247* 206*  PROT 6.8 6.9  ALBUMIN 1.2* 1.3*    TUMOR MARKERS: No results for input(s): AFPTM, CEA, CA199, CHROMGRNA in the last 8760 hours.  Assessment and Plan:  Anemia Severe leukopenia Scheduled for Bone marrow biopsy 1/6 Risks and benefits of Bone marrow bx was discussed with the patient and/or patient's family - Husband Leonard and Dtr [name] via phone including, but not limited to bleeding, infection, damage to adjacent structures or low yield requiring additional tests.  All of the questions were answered and there is agreement to proceed. Consent signed and in chart.   Thank you for this interesting consult.  I greatly enjoyed meeting Jing Naves and look forward to participating in their care.  A copy of this report was sent to the requesting provider on this date.  Electronically Signed: Pamela A Turpin, PA-C 12/24/2019, 1:39 PM   I spent a total of 20 Minutes    in face to face in clinical consultation, greater than 50% of which was counseling/coordinating care for Bone marrow bx  

## 2019-12-24 NOTE — Progress Notes (Addendum)
 NAME:  Gabrielle Wade, MRN:  [id number], DOB:  11/21/1956, LOS: 1 ADMISSION DATE:  12/23/2019, CONSULTATION DATE:  12/22/2018 REFERRING MD:  Dr. Kim, CHIEF COMPLAINT:  Chronic vent management  Brief History   64 yoF with prior medical history of cervical epidural abscess- MRSA in 08/2019 with resultant quadriplegia with tracheostomy and ventilator dependent respiratory failure sent from [facility] for evaluation of leukopenia and anemia.  PCCM consulted for chronic trach and vent management.   History of present illness   HPI obtained from medical chart review as patient is trach/ mechanical ventilator dependent.  No bedside medical records found, therefore limited HPI.   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  In ER, hemodynamically stable and initial temp 95.5.  Patient is awake in no distress on ventilator with settings PRVC, rate 12, FiO2 30%, TV 480, PEEP 5.  Unknown if patient weans.  Workup notable for WBC 0.8, Hgb 6.2, platelets 255, glucose 177, BUN 69, sCr 1.24, alk phos 247, albumin 1.2, corrected calcium 10.8, trop hs 5, lactic acid 1, normal coags, TSH 24.57, free T4 0.66, FOB negative, SARS2 PCR neg, UA negative.  CXR showing cardiomegaly with moderate bilateral pleural effusions, bilateral airspace and bibasilar opacities concerning for developing edema vs atypical infectious process vs atelectasis vs developing infiltrate.  TRH to admit, PCCM consulted for vent and tracheostomy management.   Past Medical History  cervical epidural abscess- MRSA in 08/2019 with resultant quadriplegia with tracheostomy and ventilator dependent respiratory failure, PEG, DMT2, CKD stage III, COPD, GERD, anxiety/ depression,  PTSD  Significant Hospital Events   1/4 Admit [facility]  Consults:  PCCM - trach / vent   Procedures:  pta trach pta PEG   Significant Diagnostic Tests:   Micro Data:  1/4 SARS 2/ Flu A/B >> neg 1/4 UC >> 1/4 BCx 2 >> preliminary staph species methicillin-resistant  1/4 urine strep >> 1/4 urine legionella >>  Antimicrobials:  1/4 cefepime >> 1/4 vanc >>  Interim history/subjective:  Remains stable on full mechanical ventilatory support  Objective   Blood pressure (!) 118/43, pulse 77, temperature 98.5 F (36.9 C), temperature source Oral, resp. rate (!) 21, height 5' 10" (1.778 m), SpO2 95 %.    Vent Mode: PRVC FiO2 (%):  [30 %-40 %] 40 % Set Rate:  [12 bmp] 12 bmp Vt Set:  [480 mL] 480 mL PEEP:  [5 cmH20] 5 cmH20 Plateau Pressure:  [17 cmH20-22 cmH20] 22 cmH20   Intake/Output Summary (Last 24 hours) at 12/24/2019 0850 Last data filed at 12/24/2019 0819 Gross per 24 hour  Intake 1022.51 ml  Output --  Net 1022.51 ml    Examination: General: Ill-appearing female who is trach and vent dependent HEENT: Tracheostomy is in place Neuro: Nods heads CV: Heart sounds are regular PULM: Diminished bilateral bases Vent currently on pressure regulated volume control FIO2 40% PEEP 5 of PEEP RATE 12+6 VT 480 cc  GI: soft, bsx4 active  GU: Extremities: warm/dry,  edema  Skin: no rashes or lesions  Resolved Hospital Problem list    Assessment & Plan:   Chronic respiratory failure with tracheostomy/ PEG secondary to prior cervical epidural abscess/ quadriplegia  Moderate bilateral pleural effusions HCAP +/- pulmonary edema Pulmonary blood cultures with MRSA P:  Continue full mechanical ventilatory   support Wean FiO2 as tolerated for saturating 92% Vent bundle wean for toleration Continue to follow culture and tracheal aspirates Continue current antimicrobial therapy Continue to monitor pleural effusions     Remainder per primary.  Pulmonary critical care will  continue to follow for trach and vent   Best practice:  Diet: NPO, per PEG currently not on tube feedings Pain/Anxiety/Delirium protocol (if indicated): not needed VAP protocol (if indicated): yes DVT prophylaxis: SCDs GI prophylaxis: PPI Glucose control: CBG q 4, SSI Mobility: PT/ OT Code Status: full  Family Communication: per primary, patient updated on plan of care Disposition: admit [facility]/ PCCM for chronic vent managment  Labs   CBC: Recent Labs  Lab 12/23/19 1604 12/23/19 2138 12/24/19 0232  WBC 0.8*  --  0.7*  NEUTROABS 0.0*  --   --   HGB 6.2* 7.8* 7.4*  HCT 20.9* 23.0* 24.2*  MCV 100.0  --  98.8  PLT 255  --  245    Basic Metabolic Panel: Recent Labs  Lab 12/23/19 1604 12/23/19 2138 12/24/19 0232  NA 142 144 141  K 4.6 4.9 4.8  CL 107  --  106  CO2 29  --  28  GLUCOSE 177*  --  108*  BUN 69*  --  70*  CREATININE 1.24*  --  1.23*  CALCIUM 8.6*  --  8.5*   GFR: CrCl cannot be calculated (Unknown ideal weight.). Recent Labs  Lab 12/23/19 1604 12/23/19 1610 12/24/19 0232  PROCALCITON  --   --  0.32  WBC 0.8*  --  0.7*  LATICACIDVEN  --  1.0  --     Liver Function Tests: Recent Labs  Lab 12/23/19 1604 12/24/19 0232  AST 16 17  ALT 40 38  ALKPHOS 247* 206*  BILITOT 0.6 0.7  PROT 6.8 6.9  ALBUMIN 1.2* 1.3*   No results for input(s): LIPASE, AMYLASE in the last 168 hours. No results for input(s): AMMONIA in the last 168 hours.  ABG    Component Value Date/Time   PHART 7.455 (H) 12/23/2019 2138   PCO2ART 43.6 12/23/2019 2138   PO2ART 80.0 (L) 12/23/2019 2138   HCO3 30.7 (H) 12/23/2019 2138   TCO2 32 12/23/2019 2138   O2SAT 96.0 12/23/2019 2138     Coagulation Profile: Recent Labs  Lab 12/23/19 1623  INR 1.2    Cardiac Enzymes: No results for input(s): CKTOTAL, CKMB, CKMBINDEX, TROPONINI in the last 168 hours.  HbA1C: No results found for: HGBA1C  CBG: Recent Labs  Lab 12/23/19 2202 12/24/19 0137 12/24/19 0615  12/24/19 0801  GLUCAP 99 104* 89 93      Critical care time:  30 mins      Steve Minor ACNP Acute Care Nurse Practitioner [facility] Please consult Amion 12/24/2019, 8:50 AM       

## 2019-12-24 NOTE — Progress Notes (Signed)
PROGRESS NOTE  Gabrielle Wade VZC:[id number] DOB: November 19, 1956 DOA: 12/23/2019 PCP: Townsend Roger, MD  Brief History   Gabrielle Wade  is a 64 y.o. female, w anxiety/ depression,PTSD,  hx of epidural abscess (mrsa), w quadriplegia , Dm2, CKD stage3,  Copd, Chronic Trach/ vent dependence, Gerd, chronic dysphagia, Anemia, presents with ? Thrombocytopenia, Hgb 6.8, and wbc 0.6 per [facility].   Pt notes slight dyspnea and dry cough, but unable to tell me how long this has been going on . Pt denies fever ,chills, cp, palp, n/v, diarrhea, brbpr, black stool.    In the ED the patient was found to have Temp of 95.5 and SaO2 of 96% on 30% FIO2. CXR demonstrates scattered bilateral airspace opacities, atelectasis, and cardiomegaly with volume overload. WBC was 0.8, Hgb 6.2, and creatinine of 1.24 with Alk phos 247. FOBT was negative. Blood cultures x 2 were obtained. They have grown out MRSA from one bottle and GNR from the other. ID has been consulted as has hematology/oncology. PCCM has been consulted for management of the patient's trach and vent.  The patient is receiving IV Meropenem and Vancomycin. She has been diuresed with Lasix 20 mg IV x 1.  Echocardiogram has been performed. Results are pending.  Consultants  . PCCM . Infectious disease . Hematology/Oncology  Procedures  . None  Antibiotics   Anti-infectives (From admission, onward)   Start     Dose/Rate Route Frequency Ordered Stop   12/26/19 0600  vancomycin (VANCOCIN) IVPB 1000 mg/200 mL premix     1,000 mg 200 mL/hr over 60 Minutes Intravenous Every 48 hours 12/24/19 0512     12/24/19 1000  ceFEPIme (MAXIPIME) 2 g in sodium chloride 0.9 % 100 mL IVPB  Status:  Discontinued     2 g 200 mL/hr over 30 Minutes Intravenous Every 12 hours 12/24/19 0512 12/24/19 0859   12/24/19 1000  meropenem (MERREM) 1 g in sodium chloride 0.9 % 100 mL IVPB     1 g 200 mL/hr over 30 Minutes Intravenous Every 12 hours 12/24/19 0859     12/24/19 0315   vancomycin (VANCOREADY) IVPB 1250 mg/250 mL     1,250 mg 166.7 mL/hr over 90 Minutes Intravenous  Once 12/24/19 0250 12/24/19 0819   12/23/19 2045  ceFEPIme (MAXIPIME) 2 g in sodium chloride 0.9 % 100 mL IVPB     2 g 200 mL/hr over 30 Minutes Intravenous  Once 12/23/19 2030 12/23/19 2201    .   Interval History/Subjective  The patient is resting comfortably. No new complaints.  Objective   Vitals:  Vitals:   12/24/19 1232 12/24/19 1300  BP: (!) 119/51 137/61  Pulse: 97 66  Resp: 14 (!) 7  Temp:    SpO2: 100% 98%    Exam:  Constitutional:  . The patient is awake, alert, and oriented x 3. No acute distress. Respiratory:  . Positive for increased work of breathing. . Diminished breath sounds bilateral . Mild bilateral rales . No wheezes or rhonchi are appreciated. . No tactile fremitus Cardiovascular:  . Regular rate and rhythm . No murmurs, ectopy, or gallups. . No lateral PMI. No thrills. Abdomen:  . Abdomen is soft, non-tender, non-distended . No hernias, masses, or organomegaly . Normoactive bowel sounds.  Musculoskeletal:  . No cyanosis, clubbing, or edema Skin:  . No rashes, lesions, ulcers . palpation of skin: no induration or nodules Neurologic:  . CN 2-12 intact . Pt is a quadriplegic Psychiatric:  . Mental status o Mood,  affect appropriate o Orientation to person, place, time  . judgment and insight appear intact  I have personally reviewed the following:   Today's Data  . Vitals, BMP, CBC  Micro Data  . Blood Cultures: 1 positive for MRSA, 1 positive for GNR  Cardiology Data  . Echo performed, results pending.  Scheduled Meds: . sodium chloride   Intravenous Once  . chlorhexidine gluconate (MEDLINE KIT)  15 mL Mouth Rinse BID  . insulin aspart  0-9 Units Subcutaneous Q4H  . mouth rinse  15 mL Mouth Rinse 10 times per day  . nystatin  5 mL Oral TID AC & HS  . pantoprazole sodium  40 mg Per Tube Q1200   Continuous Infusions: .  meropenem (MERREM) IV    . [START ON 12/26/2019] vancomycin      Principal Problem:   Anemia Active Problems:   Neutropenia (HCC)   Quadriplegia (HCC)   Diabetes ([place])   LOS: 1 day   A & P  Sepsis: Neutropenia, AKI, Hypothermia, Hypoxia, MRSA Bacteremia, GNR bacteremia, Volume overload  Bacteremia: MRSA and GNR. Infectious disease consulted. Echocardiogram has been performed and results are pending. The patient is receiving IV Vancomycin and meropenem. 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 This resulted in her quadriplegia. She has been at [facility] for a month.  Neutropenia/Anemia: Severe. Likely due to Sepsis from bacteremia. Hematology consulted. The patient has been transfused with 2 units PRBC's. Pt will have a bone marrow biopsy. I appreciate Dr. Virgie Dad assistance.  Acute on chronic respiratory failure, tracheostomy and vent dependence: PCCM consultated. CXR demonstrated scattered bilateral opacities secondary to developing pulmonary edema or atypical infectious process. Atelectasis also present.   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 PT/OT will be consulted to maintain range of motion.  AKI: Creatinine is 1.24. It is unknown what the baseline creatinine is, however, Given her reduced muscle mass from quadriplegia, I would be surprised if it were greater than 1.0.  Diabetes: HbA1c pending. The patient's glucoses have run 89-104 over the past few hours without any insulin coverage. Continue to monitor.  Hypoalbuminemia: Consult nutrition.  Abnormal TSH: Await Ft4 and T3 to determine significance.  I have seen and examined this patient myself. I have spent 35 minutes in his evaluation and care.  Gabrielle Glidden, DO [facility] Direct contact: see [email or web address]  7PM-7AM contact night coverage as above 12/24/2019, 1:39 PM  LOS: 1 day

## 2019-12-24 NOTE — Progress Notes (Signed)
PHARMACY - PHYSICIAN COMMUNICATION CRITICAL VALUE ALERT - BLOOD CULTURE IDENTIFICATION (BCID)  Gabrielle Wade is an 64 y.o. female who presented to [facility] on 12/23/2019 with a chief complaint of decreased WBC and low Hemoglobin.  Transferred from [facility] - has been a resident there since 12/18/19.  Assessment:  MRSA and Ecoli bacteremia in a patient that is trach/vent dependent from [facility] hospital  Name of physician (or Provider) Contacted: ID consult team and Dr. Gerri Lins notified  Current antibiotics: Vancomycin and cefepime  Changes to prescribed antibiotics recommended: Continue current antibiotics for now - ID team to evaluate today  Results for orders placed or performed during the hospital encounter of 12/23/19  Blood Culture ID Panel (Reflexed) (Collected: 12/23/2019  4:58 PM)  Result Value Ref Range   Enterococcus species NOT DETECTED NOT DETECTED   Listeria monocytogenes NOT DETECTED NOT DETECTED   Staphylococcus species DETECTED (A) NOT DETECTED   Staphylococcus aureus (BCID) DETECTED (A) NOT DETECTED   Methicillin resistance DETECTED (A) NOT DETECTED   Streptococcus species NOT DETECTED NOT DETECTED   Streptococcus agalactiae NOT DETECTED NOT DETECTED   Streptococcus pneumoniae NOT DETECTED NOT DETECTED   Streptococcus pyogenes NOT DETECTED NOT DETECTED   Acinetobacter baumannii NOT DETECTED NOT DETECTED   Enterobacteriaceae species DETECTED (A) NOT DETECTED   Enterobacter cloacae complex NOT DETECTED NOT DETECTED   Escherichia coli DETECTED (A) NOT DETECTED   Klebsiella oxytoca NOT DETECTED NOT DETECTED   Klebsiella pneumoniae NOT DETECTED NOT DETECTED   Proteus species NOT DETECTED NOT DETECTED   Serratia marcescens NOT DETECTED NOT DETECTED   Carbapenem resistance NOT DETECTED NOT DETECTED   Haemophilus influenzae NOT DETECTED NOT DETECTED   Neisseria meningitidis NOT DETECTED NOT DETECTED   Pseudomonas aeruginosa NOT DETECTED NOT DETECTED   Candida albicans NOT  DETECTED NOT DETECTED   Candida glabrata NOT DETECTED NOT DETECTED   Candida krusei NOT DETECTED NOT DETECTED   Candida parapsilosis NOT DETECTED NOT DETECTED   Candida tropicalis NOT DETECTED NOT DETECTED    Mickeal Skinner 12/24/2019  8:31 AM

## 2019-12-24 NOTE — Progress Notes (Signed)
Patient ID: Gabrielle Wade, female   DOB: [birth year], 64 y.o.   MRN: [id number]   Request received for Bone marrow biopsy  Will await admission and tentatively plan for Bx 1/6 in IR Will contact Cytology for possible time in their schedule

## 2019-12-24 NOTE — Consult Note (Signed)
[place] for Infectious Disease    Date of Admission:  12/23/2019     Total days of antibiotics 1                 Reason for Consult: MRSAB, EColi bacteremia     Referring Provider: CHAMP autoconsult  Primary Care Provider: Townsend Roger, MD   Assessment: Gabrielle Wade is a 64 y.o. female from [facility] in [place] here with neutropenia, anemia and hypothermia. She has since been found to have polymicrobial bacteremia with MRSA and E Coli noted on early diagnostics. Given she is LTAC resident concern for ESBL EColi and will change cefepime to meropenem for now while we await results. Continue Vancomycin for treatment of MRSA bacteremia. 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 No records available in [facility] or sent with the patient.   For her staphylococcus aureus bacteremia she will need a transthoracic echocardiogram to assess for endocarditis - on exam she has no murmur, no implanted cardiac devices or valves. No artificial joints or other hardware. Her PICC line will need to be removed with 48 hour line holiday if possible, until her bacteremia can be cleared. Would also image her spine given history of MRSA epidural abscess and complaint over fullness/pain to thoracic spine. Would ultrasound RUE with concern for DVT/septic thrombosis. Will need thorough skin assessment including sacrum given polymicrobial bacteremia --> she has 100K CFU of Klebsiella and E Coli identified in her urine awaiting sensitivities. Given quadriplegia unclear if contributing.   She has white coating on her tongue - thrush vs leukoplakia. Given she has complaint is is painful will start fluconazole on presumption thrush in the setting of neutropenia. Her HIV Ab was negative.   Plan: 1. Contact isolation for MRSA infection  2. MRI of spine w/ and w/o  3. TTE 4. Repeat Blood cultures   5. Vancomycin + Merrem  6. Neutropenia/anemia work up directed by heme-onc 7. Fluconazole 200 mg VT QD  I tried to call her husband to ask about hospitalization in September 2020 when she had C-spine infection however he did not answer and VM full. Will try back.   Principal Problem:   MRSA bacteremia Active Problems:   E coli bacteremia   Pressure ulcer of left heel, stage 3 (HCC)   Anemia   Neutropenia (HCC)   Quadriplegia (HCC)   Diabetes (HCC)   Edema of right upper arm   . sodium chloride   Intravenous Once  . chlorhexidine gluconate (MEDLINE KIT)  15 mL Mouth Rinse BID  . insulin aspart  0-9 Units Subcutaneous Q4H  . mouth rinse  15 mL Mouth Rinse 10 times per day  . nystatin  5 mL Oral TID AC & HS  . pantoprazole sodium  40 mg Per Tube Q1200    HPI: Gabrielle Wade is a 64 y.o. female sent here from [facility] for evaluation of neutropenia and anemia.   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   In discussing with Catrena she states that she has been feeling "off" for a few days prior to admission here at [facility]. She cannot tell me exactly how long she has felt ill but has noticed some increased cough recently and swelling to arms. She thinks she has at  least one pressure sore. She is asking me how her son Legrand Como is doing. She is aware she is at [facility] but uncertain as to why she was sent. She does indicate she has had some subjective fevers with chills and sweating episodes. She also feels that she has had increased "fullness/pain" between the shoulder blades.   She has difficulty swallowing. She does not eat/masticate and is fed via PEG. She has a PICC/Midline in the right upper extremity in place but cannot tell me when this was placed. She does not think she was getting any antibiotics prior to coming here  today.    Review of Systems: Review of Systems  Constitutional: Positive for chills, diaphoresis and malaise/fatigue. Negative for fever.  HENT: Negative for congestion and sore throat.        Dysphagia  Eyes: Negative for blurred vision and pain.  Respiratory: Positive for cough and wheezing. Negative for sputum production and shortness of breath.   Cardiovascular: Negative for chest pain.  Gastrointestinal: Negative for abdominal pain, diarrhea and vomiting.  Genitourinary: Negative for dysuria and frequency.  Musculoskeletal: Positive for back pain (thoracic spine ).  Skin: Positive for itching (dry skin ). Negative for rash.  Neurological: Positive for focal weakness. Negative for dizziness and headaches. Weakness: quadriplegia.  Endo/Heme/Allergies: Does not bruise/bleed easily.  Psychiatric/Behavioral: Positive for depression. The patient is nervous/anxious.     Past Medical History:  Diagnosis Date  . Acute and chronic respiratory failure (acute-on-chronic) ([place])   . Acute posthemorrhagic anemia   . Anemia in other chronic diseases classified elsewhere   . Anxiety   . Anxiety   . Asthma   . Bradycardia   . Chronic kidney disease, stage 3   . Compartment syndrome, unspecified, sequela   . COPD (chronic obstructive pulmonary disease) ([place])   . Dependence on respirator ([place])   . Diabetes mellitus without complication ([place])   . Dysphagia, oropharyngeal   . GERD (gastroesophageal reflux disease)   . Hyperkalemia   . Hypotension   . Intraspinal abscess and granuloma   . MDD (major depressive disorder)   . MRSA (methicillin resistant Staphylococcus aureus)   . Presence of cardiac pacemaker   . Pressure ulcer of unspecified heel, unspecified stage   . PTSD (post-traumatic stress disorder)   . Quadriplegia [facility])     Social History   Tobacco Use  . Smoking status: Former Research scientist (life sciences)  . Smokeless tobacco: Never Used  Substance Use Topics  . Alcohol use: Not Currently  .  Drug use: Not Currently    Family History  Family history unknown: Yes   Allergies  Allergen Reactions  . Codeine   . Hydrocodone   . Influenza A (H1n1) Monovalent Vaccine   . Latex   . Morphine And Related   . Penicillins   . Shellfish Allergy   . Tape     OBJECTIVE: Blood pressure 137/61, pulse 66, temperature 98.5 F (36.9 C), temperature source Oral, resp. rate (!) 7, height 5' 10"  (1.778 m), SpO2 98 %.  Physical Exam Constitutional:      Comments: chronically ill in appearance but otherwise non-toxic and breathing comfortably.  HENT:     Head: Normocephalic.     Mouth/Throat:     Mouth: Mucous membranes are dry.     Comments: White coating overlying tongue, tender Eyes:     General: No scleral icterus.    Conjunctiva/sclera: Conjunctivae normal.  Neck:     Comments: Trach present. Clean and dry. No  secretions noted  Cardiovascular:     Rate and Rhythm: Normal rate and regular rhythm.     Heart sounds: No murmur.  Pulmonary:     Effort: Pulmonary effort is normal. No respiratory distress.     Breath sounds: Wheezing present.  Abdominal:     General: Bowel sounds are normal.     Tenderness: There is no abdominal tenderness.     Comments: PEG site unremarkable  Musculoskeletal:     Cervical back: Normal range of motion.     Comments: Swelling R>L upper extremity, cool to the touch with pitting edema. R arm with PICC line. No erythema/purulence noted.   Skin:    General: Skin is warm.     Capillary Refill: Capillary refill takes less than 2 seconds.     Coloration: Skin is pale.     Findings: Rash: flaking seborrhea head/neck down to chest.     Comments: Pressure ulcer with full thickness skin loss overlying left heel with foam dressing and boot. Unable to assess sacrum at this time as I cannot turn her by myself.   Neurological:     Mental Status: She is alert.     Comments: Seems to be oriented   Psychiatric:        Mood and Affect: Mood normal.      Lab Results Lab Results  Component Value Date   WBC 0.7 (LL) 12/24/2019   HGB 7.4 (L) 12/24/2019   HCT 24.2 (L) 12/24/2019   MCV 98.8 12/24/2019   PLT 245 12/24/2019    Lab Results  Component Value Date   CREATININE 1.23 (H) 12/24/2019   BUN 70 (H) 12/24/2019   NA 141 12/24/2019   K 4.8 12/24/2019   CL 106 12/24/2019   CO2 28 12/24/2019    Lab Results  Component Value Date   ALT 38 12/24/2019   AST 17 12/24/2019   ALKPHOS 206 (H) 12/24/2019   BILITOT 0.7 12/24/2019     Microbiology: Recent Results (from the past 240 hour(s))  Blood culture (routine x 2)     Status: None (Preliminary result)   Collection Time: 12/23/19  4:25 PM   Specimen: BLOOD RIGHT ARM  Result Value Ref Range Status   Specimen Description BLOOD RIGHT ARM  Final   Special Requests   Final    BOTTLES DRAWN AEROBIC AND ANAEROBIC Blood Culture adequate volume   Culture  Setup Time   Final    GRAM POSITIVE COCCI IN BOTH AEROBIC AND ANAEROBIC BOTTLES CRITICAL VALUE NOTED.  VALUE IS CONSISTENT WITH PREVIOUSLY REPORTED AND CALLED VALUE.    Culture   Final    CULTURE REINCUBATED FOR BETTER GROWTH Performed at [facility], [place] [address], [place], [place] [address]    Report Status PENDING  Incomplete  Respiratory Panel by RT PCR (Flu A&B, Covid) - Nasopharyngeal Swab     Status: None   Collection Time: 12/23/19  4:27 PM   Specimen: Nasopharyngeal Swab  Result Value Ref Range Status   SARS Coronavirus 2 by RT PCR NEGATIVE NEGATIVE Final    Comment: (NOTE) SARS-CoV-2 target nucleic acids are NOT DETECTED. The SARS-CoV-2 RNA is generally detectable in upper respiratoy specimens during the acute phase of infection. The lowest concentration of SARS-CoV-2 viral copies this assay can detect is 131 copies/mL. A negative result does not preclude SARS-Cov-2 infection and should not be used as the sole basis for treatment or other patient management decisions. A negative result may occur with  improper specimen collection/handling, submission of specimen other than nasopharyngeal swab, presence of viral mutation(s) within the areas targeted by this assay, and inadequate number of viral copies (<131 copies/mL). A negative result must be combined with clinical observations, patient history, and epidemiological information. The expected result is Negative. Fact Sheet for Patients:  PinkCheek.be Fact Sheet for Healthcare Providers:  GravelBags.it This test is not yet ap proved or cleared by the [place] FDA and  has been authorized for detection and/or diagnosis of SARS-CoV-2 by FDA under an Emergency Use Authorization (EUA). This EUA will remain  in effect (meaning this test can be used) for the duration of the COVID-19 declaration under Section 564(b)(1) of the Act, 21 U.S.C. section 360bbb-3(b)(1), unless the authorization is terminated or revoked sooner.    Influenza A by PCR NEGATIVE NEGATIVE Final   Influenza B by PCR NEGATIVE NEGATIVE Final    Comment: (NOTE) The Xpert Xpress SARS-CoV-2/FLU/RSV assay is intended as an aid in  the diagnosis of influenza from Nasopharyngeal swab specimens and  should not be used as a sole basis for treatment. Nasal washings and  aspirates are unacceptable for Xpert Xpress SARS-CoV-2/FLU/RSV  testing. Fact Sheet for Patients: PinkCheek.be Fact Sheet for Healthcare Providers: GravelBags.it This test is not yet approved or cleared by the [place] FDA and  has been authorized for detection and/or diagnosis of SARS-CoV-2 by  FDA under an Emergency Use Authorization (EUA). This EUA will remain  in effect (meaning this test can be used) for the duration of the  Covid-19 declaration under Section 564(b)(1) of the Act, 21  U.S.C. section 360bbb-3(b)(1), unless the authorization is  terminated or revoked. Performed at  [facility], [place] [address] [place], [place] [address]   Urine culture     Status: Abnormal (Preliminary result)   Collection Time: 12/23/19  4:51 PM   Specimen: In/Out Cath Urine  Result Value Ref Range Status   Specimen Description IN/OUT CATH URINE  Final   Special Requests NONE  Final   Culture (A)  Final    >=100,000 COLONIES/mL KLEBSIELLA PNEUMONIAE >=100,000 COLONIES/mL ESCHERICHIA COLI SUSCEPTIBILITIES TO FOLLOW Performed at [facility], [place] [address], [place], [place] [address]    Report Status PENDING  Incomplete  Blood culture (routine x 2)     Status: None (Preliminary result)   Collection Time: 12/23/19  4:58 PM   Specimen: BLOOD  Result Value Ref Range Status   Specimen Description BLOOD RIGHT THUMB  Final   Special Requests   Final    BOTTLES DRAWN AEROBIC AND ANAEROBIC Blood Culture adequate volume   Culture  Setup Time   Final    GRAM NEGATIVE RODS IN BOTH AEROBIC AND ANAEROBIC BOTTLES GRAM POSITIVE COCCI CRITICAL RESULT CALLED TO, READ BACK BY AND VERIFIED WITH: J. FRENS PHARMD, AT 0820 12/24/19 BY D.V [facility] Performed at [facility], [place] [address] [place], [place] [address]    Culture GRAM NEGATIVE RODS  Final   Report Status PENDING  Incomplete  Blood Culture ID Panel (Reflexed)     Status: Abnormal   Collection Time: 12/23/19  4:58 PM  Result Value Ref Range Status   Enterococcus species NOT DETECTED NOT DETECTED Final   Listeria monocytogenes NOT DETECTED NOT DETECTED Final   Staphylococcus species DETECTED (A) NOT DETECTED Final    Comment: CRITICAL RESULT CALLED TO, READ BACK BY AND VERIFIED WITH: J. FRENS PHARMD, AT 8182 12/24/19 BY D. VANHOOK    Staphylococcus  aureus (BCID) DETECTED (A) NOT DETECTED Final    Comment: Methicillin (oxacillin)-resistant Staphylococcus aureus (MRSA). MRSA is predictably resistant to beta-lactam antibiotics (except ceftaroline). Preferred therapy is vancomycin unless clinically contraindicated.  Patient requires contact precautions if  hospitalized. CRITICAL RESULT CALLED TO, READ BACK BY AND VERIFIED WITH: J. FRENS PHARMD, AT 4128 12/24/19 BY D. VANHOOK    Methicillin resistance DETECTED (A) NOT DETECTED Final    Comment: CRITICAL RESULT CALLED TO, READ BACK BY AND VERIFIED WITH: J. FRENS PHARMD, AT 2081 12/24/19 BY D. VANHOOK    Streptococcus species NOT DETECTED NOT DETECTED Final   Streptococcus agalactiae NOT DETECTED NOT DETECTED Final   Streptococcus pneumoniae NOT DETECTED NOT DETECTED Final   Streptococcus pyogenes NOT DETECTED NOT DETECTED Final   Acinetobacter baumannii NOT DETECTED NOT DETECTED Final   Enterobacteriaceae species DETECTED (A) NOT DETECTED Final    Comment: Enterobacteriaceae represent a large family of gram-negative bacteria, not a single organism. CRITICAL RESULT CALLED TO, READ BACK BY AND VERIFIED WITH: J. FRENS PHARMD, AT 3887 12/24/19 BY D. VANHOOK    Enterobacter cloacae complex NOT DETECTED NOT DETECTED Final   Escherichia coli DETECTED (A) NOT DETECTED Final    Comment: CRITICAL RESULT CALLED TO, READ BACK BY AND VERIFIED WITH: J. FRENS PHARMD, AT 1959 12/24/19 BY D. VANHOOK    Klebsiella oxytoca NOT DETECTED NOT DETECTED Final   Klebsiella pneumoniae NOT DETECTED NOT DETECTED Final   Proteus species NOT DETECTED NOT DETECTED Final   Serratia marcescens NOT DETECTED NOT DETECTED Final   Carbapenem resistance NOT DETECTED NOT DETECTED Final   Haemophilus influenzae NOT DETECTED NOT DETECTED Final   Neisseria meningitidis NOT DETECTED NOT DETECTED Final   Pseudomonas aeruginosa NOT DETECTED NOT DETECTED Final   Candida albicans NOT DETECTED NOT DETECTED Final   Candida glabrata NOT DETECTED NOT DETECTED Final   Candida krusei NOT DETECTED NOT DETECTED Final   Candida parapsilosis NOT DETECTED NOT DETECTED Final   Candida tropicalis NOT DETECTED NOT DETECTED Final    Comment: Performed at [facility], [place] [address] [place], [place]  [address]     Janene Madeira, MSN, NP-C [place] [facility] for Infectious Disease [email or web address] Pager: [phone] Office: [phone] [place]: [phone]   12/24/2019 2:21 PM

## 2019-12-24 NOTE — Progress Notes (Signed)
PHARMACY NOTE:  ANTIMICROBIAL RENAL DOSAGE ADJUSTMENT  Current antimicrobial regimen includes a mismatch between antimicrobial dosage and estimated renal function.  As per policy approved by the Pharmacy & Therapeutics and Medical Executive Committees, the antimicrobial dosage will be adjusted accordingly.  Current antimicrobial dosage:  Meropenem 1 gm IV Q 12 hrs  Indication: E coli bacteremia (high risk for resistance)  Renal Function:  Estimated Creatinine Clearance: 55.9 mL/min (A) (by C-G formula based on SCr of 1.23 mg/dL (H)). []      On intermittent HD, scheduled: []      On CRRT    Antimicrobial dosage has been changed to:  Meropenem 1 gm IV Q 8  hrs  Additional comments: Pharmacy will continue to monitor renal function and adjust as needed  Thank you for allowing pharmacy to be a part of this patient's care.  , PharmD, BCPS, [facility] Clinical Pharmacist 12/24/2019 7:43 PM

## 2019-12-24 NOTE — ED Notes (Addendum)
Husband -- Darcel Bayley Kozel [phone]-- updated.  Will come tomorrow to visit

## 2019-12-24 NOTE — Progress Notes (Signed)
  Echocardiogram 2D Echocardiogram has been performed.  Gabrielle Wade 12/24/2019, 1:29 PM

## 2019-12-24 NOTE — Progress Notes (Signed)
Pt has unspecified implant in lower left chest seen on CXR. Dr. Mosetta Putt could not verify what sort of device is implanted. Per Dr. Mosetta Putt, cannot perform exam until implant is verified due to safety concerns.

## 2019-12-24 NOTE — Progress Notes (Signed)
RN paged NP regarding home ([facility] facility) pain meds Percocet PRN.  Pt shows allergy to like substances but has been on Percocet with [facility] for severe pain.  RN will monitor.

## 2019-12-24 NOTE — Progress Notes (Signed)
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  The patient had been residing at [facility] in [place] until 12/18/2019 when she was transferred to [place].  I have contacted [facility] and they tell me that on the day of transfer she was found to have a white cell count of 1.5 and a hemoglobin of 6.9.  This was repeated and the white cell count had dropped to 0.7 as of 12/22/2018 and the hemoglobin remains low at 6.7.  Accordingly the patient was brought to the emergency room at [facility] yesterday, 12/22/2018 for further evaluation and we were consulted today, 12/23/2018.  The patient's subsequent history is as detailed below.  INTERVAL HISTORY: Per ED note, the patient is not able to provide much history but does nod yes or no in response to questions.  REVIEW OF SYSTEMS: In the emergency room the patient's temperature was 95.5, she was described as awake in no distress with unremarkable ventilator settings.  PAST MEDICAL HISTORY: Past Medical History:  Diagnosis Date  . Acute and chronic respiratory failure (acute-on-chronic) ([place])   . Acute posthemorrhagic anemia   . Anemia in other chronic diseases classified elsewhere   . Anxiety   . Anxiety   . Asthma   . Bradycardia   . Chronic kidney disease, stage 3   . Compartment syndrome, unspecified, sequela   . COPD (chronic  obstructive pulmonary disease) ([place])   . 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)   . Diabetes mellitus without complication ([place])   . Dysphagia, oropharyngeal   . GERD (gastroesophageal reflux disease)   . Hyperkalemia   . Hypotension   . Intraspinal abscess and granuloma   . MDD (major depressive disorder)   . MRSA (methicillin resistant Staphylococcus aureus)   . Presence of cardiac pacemaker   . Pressure ulcer of unspecified heel, unspecified stage   . PTSD (post-traumatic stress disorder)   . Quadriplegia ([place])     PAST SURGICAL HISTORY: Past Surgical History:  Procedure Laterality Date  . PEG TUBE PLACEMENT    . TRACHEOSTOMY      FAMILY HISTORY Family History  Family history unknown: Yes     HEALTH MAINTENANCE: Social History   Tobacco Use  . Smoking status: Former Research scientist (life sciences)  . Smokeless tobacco: Never Used  Substance Use Topics  . Alcohol use: Not Currently  . Drug use: Not Currently      Allergies  Allergen Reactions  . Codeine   . Hydrocodone   . Influenza A (H1n1) Monovalent Vaccine   . Latex   . Morphine And Related   . Penicillins   . Shellfish Allergy   . Tape     Current Facility-Administered Medications  Medication Dose Route Frequency Provider Last Rate Last Admin  . 0.9 %  sodium chloride infusion (Manually program via Guardrails IV Fluids)   Intravenous Once Gareth Morgan, MD   Stopped at 12/23/19 2340  . acetaminophen (TYLENOL) tablet 650 mg  650  mg Oral Q6H PRN Jani Gravel, MD       Or  . acetaminophen (TYLENOL) suppository 650 mg  650 mg Rectal Q6H PRN Jani Gravel, MD      . ceFEPIme (MAXIPIME) 2 g in sodium chloride 0.9 % 100 mL IVPB  2 g Intravenous Q12H Jani Gravel, MD      . chlorhexidine gluconate (MEDLINE KIT) (PERIDEX) 0.12 % solution 15 mL  15 mL Mouth Rinse BID Jennelle Human B, NP      . insulin aspart (novoLOG) injection 0-9 Units  0-9 Units Subcutaneous Q4H Jani Gravel, MD      . ipratropium-albuterol (DUONEB) 0.5-2.5 (3) MG/3ML  nebulizer solution 3 mL  3 mL Nebulization Q6H PRN Jennelle Human B, NP      . MEDLINE mouth rinse  15 mL Mouth Rinse 10 times per day Jennelle Human B, NP      . nystatin (MYCOSTATIN) 100000 UNIT/ML suspension 500,000 Units  5 mL Oral TID AC & HS Jennelle Human B, NP   500,000 Units at 12/24/19 0020  . pantoprazole sodium (PROTONIX) 40 mg/20 mL oral suspension 40 mg  40 mg Per Tube Q1200 Arnell Asal, NP      . Derrill Memo ON 12/26/2019] vancomycin (VANCOCIN) IVPB 1000 mg/200 mL premix  1,000 mg Intravenous Q48H Jani Gravel, MD       Current Outpatient Medications  Medication Sig Dispense Refill  . budesonide (PULMICORT) 0.5 MG/2ML nebulizer solution Take 0.5 mg by nebulization 2 (two) times daily.    . cephALEXin (KEFLEX) 500 MG capsule Take 500 mg by mouth 2 (two) times daily.    . chlorhexidine (PERIDEX) 0.12 % solution Use as directed 15 mLs in the mouth or throat See admin instructions. By shift starting    . famotidine (PEPCID) 20 MG tablet Take 20 mg by mouth 2 (two) times daily.    . insulin lispro (HUMALOG) 100 UNIT/ML injection Inject 3-12 Units into the skin See admin instructions. < 60 or > 400 notify Dr; 150 - 200, 3 units; 201 - 250, 6 units; 251 - 300 8 units; 301 - 350, 12 units    . insulin NPH Human (NOVOLIN N) 100 UNIT/ML injection Inject 7 Units into the skin every 6 (six) hours.    Marland Kitchen ipratropium-albuterol (DUONEB) 0.5-2.5 (3) MG/3ML SOLN Take 3 mLs by nebulization every 4 (four) hours as needed (shortness of breath).    . LORazepam (ATIVAN) 2 MG/ML concentrated solution Take 0.5 mg by mouth every 8 (eight) hours as needed for anxiety.    . Nutritional Supplements (ISOSOURCE 1.5 CAL PO) Take 55 mL/hr by mouth See admin instructions. By shift starting    . oxyCODONE-acetaminophen (PERCOCET/ROXICET) 5-325 MG tablet Take 1 tablet by mouth every 4 (four) hours as needed for severe pain.    . traZODone (DESYREL) 50 MG tablet Take 50 mg by mouth at bedtime.      OBJECTIVE:  Vitals:    12/24/19 0626 12/24/19 0732  BP:  (!) 118/43  Pulse:  77  Resp:  (!) 21  Temp:    SpO2: 90% 95%     There is no height or weight on file to calculate BMI.   Wt Readings from Last 3 Encounters:  No data found for [facility]    Physical exam to follow   LAB RESULTS:  CMP     Component Value Date/Time   NA 141 12/24/2019 0232   K 4.8 12/24/2019 0232   CL 106 12/24/2019 0232  CO2 28 12/24/2019 0232   GLUCOSE 108 (H) 12/24/2019 0232   BUN 70 (H) 12/24/2019 0232   CREATININE 1.23 (H) 12/24/2019 0232   CALCIUM 8.5 (L) 12/24/2019 0232   PROT 6.9 12/24/2019 0232   ALBUMIN 1.3 (L) 12/24/2019 0232   AST 17 12/24/2019 0232   ALT 38 12/24/2019 0232   ALKPHOS 206 (H) 12/24/2019 0232   BILITOT 0.7 12/24/2019 0232   GFRNONAA 46 (L) 12/24/2019 0232   GFRAA 53 (L) 12/24/2019 0232    No results found for: TOTALPROTELP, ALBUMINELP, A1GS, A2GS, BETS, BETA2SER, GAMS, MSPIKE, SPEI  No results found for: KPAFRELGTCHN, LAMBDASER, [facility]  Lab Results  Component Value Date   WBC 0.7 (LL) 12/24/2019   NEUTROABS 0.0 (L) 12/23/2019   HGB 7.4 (L) 12/24/2019   HCT 24.2 (L) 12/24/2019   MCV 98.8 12/24/2019   PLT 245 12/24/2019    @LASTCHEMISTRY @  No results found for: LABCA2  No components found for: IRCVEL381  Recent Labs  Lab 12/23/19 1623  INR 1.2    No results found for: LABCA2  No results found for: OFB510  No results found for: CHE527  No results found for: POE423  No results found for: CA2729  No components found for: HGQUANT  No results found for: CEA1 / No results found for: CEA1   No results found for: AFPTUMOR  No results found for: CHROMOGRNA  No results found for: PSA1  Admission on 12/23/2019  Component Date Value Ref Range Status  . Fecal Occult Bld 12/23/2019 NEGATIVE  NEGATIVE Final  . WBC 12/23/2019 0.8* 4.0 - 10.5 K/uL Final   Comment: REPEATED TO VERIFY THIS CRITICAL RESULT HAS VERIFIED AND BEEN CALLED TO T.YOUNG RN BY IMANI MANNING ON 01  04 2021 AT 1704, AND HAS BEEN READ BACK.    Marland Kitchen RBC 12/23/2019 2.09* 3.87 - 5.11 MIL/uL Final  . Hemoglobin 12/23/2019 6.2* 12.0 - 15.0 g/dL Final   Comment: REPEATED TO VERIFY THIS CRITICAL RESULT HAS VERIFIED AND BEEN CALLED TO T.YOUNG RN BY IMANI MANNING ON 01 04 2021 AT 1704, AND HAS BEEN READ BACK.    Marland Kitchen HCT 12/23/2019 20.9* 36.0 - 46.0 % Final  . MCV 12/23/2019 100.0  80.0 - 100.0 fL Final  . MCH 12/23/2019 29.7  26.0 - 34.0 pg Final  . MCHC 12/23/2019 29.7* 30.0 - 36.0 g/dL Final  . RDW 12/23/2019 18.6* 11.5 - 15.5 % Final  . Platelets 12/23/2019 255  150 - 400 K/uL Final   REPEATED TO VERIFY  . nRBC 12/23/2019 0.0  0.0 - 0.2 % Final  . Neutrophils Relative % 12/23/2019 0  % Final  . Neutro Abs 12/23/2019 0.0* 1.7 - 7.7 K/uL Final  . Lymphocytes Relative 12/23/2019 58  % Final  . Lymphs Abs 12/23/2019 0.5* 0.7 - 4.0 K/uL Final  . Monocytes Relative 12/23/2019 0  % Final  . Monocytes Absolute 12/23/2019 0.0* 0.1 - 1.0 K/uL Final  . Eosinophils Relative 12/23/2019 40  % Final  . Eosinophils Absolute 12/23/2019 0.3  0.0 - 0.5 K/uL Final  . Basophils Relative 12/23/2019 2  % Final  . Basophils Absolute 12/23/2019 0.0  0.0 - 0.1 K/uL Final  . nRBC 12/23/2019 2* 0 /100 WBC Final  . Abs Immature Granulocytes 12/23/2019 0.00  0.00 - 0.07 K/uL Final  . Polychromasia 12/23/2019 PRESENT   Final   Performed at [facility], [place] [address], [place], [place] [address]  . Sodium 12/23/2019 142  135 - 145 mmol/L Final  .  Potassium 12/23/2019 4.6  3.5 - 5.1 mmol/L Final  . Chloride 12/23/2019 107  98 - 111 mmol/L Final  . CO2 12/23/2019 29  22 - 32 mmol/L Final  . Glucose, Bld 12/23/2019 177* 70 - 99 mg/dL Final  . BUN 12/23/2019 69* 8 - 23 mg/dL Final  . Creatinine, Ser 12/23/2019 1.24* 0.44 - 1.00 mg/dL Final  . Calcium 12/23/2019 8.6* 8.9 - 10.3 mg/dL Final  . Total Protein 12/23/2019 6.8  6.5 - 8.1 g/dL Final  . Albumin 12/23/2019 1.2* 3.5 - 5.0 g/dL Final  . AST 12/23/2019 16  15  - 41 U/L Final  . ALT 12/23/2019 40  0 - 44 U/L Final  . Alkaline Phosphatase 12/23/2019 247* 38 - 126 U/L Final  . Total Bilirubin 12/23/2019 0.6  0.3 - 1.2 mg/dL Final  . GFR calc non Af Amer 12/23/2019 46* >60 mL/min Final  . GFR calc Af Amer 12/23/2019 53* >60 mL/min Final  . Anion gap 12/23/2019 6  5 - 15 Final   Performed at [facility], [place] [address] [place], [place] [address]  . ABO/RH(D) 12/23/2019 O POS   Final  . Antibody Screen 12/23/2019 POS   Final  . Sample Expiration 12/23/2019 12/26/2019,2359   Final  . Antibody Identification 18/84/1660 ANTI K   Final  . DAT, IgG 12/23/2019 POS   Final  . Antibody ID,T [place] 12/23/2019 NON SPECIFIC ANTIBODY REACTIVITY   Final  . PT AG Type 12/23/2019    Final                   Value:NEGATIVE FOR KELL ANTIGEN Performed at [facility], [place] [address], [place], [place] [address]   . Unit Number 12/23/2019 [id number]   Final  . Blood Component Type 12/23/2019 RED CELLS,LR   Final  . Unit division 12/23/2019 00   Final  . Status of Unit 12/23/2019 ALLOCATED   Final  . Transfusion Status 12/23/2019 OK TO TRANSFUSE   Final  . Crossmatch Result 12/23/2019 COMPATIBLE   Final  . Donor AG Type 12/23/2019 NEGATIVE FOR KELL ANTIGEN   Final  . Unit Number 12/23/2019 [id number]   Final  . Blood Component Type 12/23/2019 RED CELLS,LR   Final  . Unit division 12/23/2019 00   Final  . Status of Unit 12/23/2019 ISSUED   Final  . Transfusion Status 12/23/2019 OK TO TRANSFUSE   Final  . Crossmatch Result 12/23/2019 COMPATIBLE   Final  . Donor AG Type 12/23/2019 NEGATIVE FOR KELL ANTIGEN   Final  . SARS Coronavirus 2 Ag 12/23/2019 NEGATIVE  NEGATIVE Final   Comment: (NOTE) SARS-CoV-2 antigen NOT DETECTED.  Negative results are presumptive.  Negative results do not preclude SARS-CoV-2 infection and should not be used as the sole basis for treatment or other patient management decisions, including infection  control decisions,  particularly in the presence of clinical signs and  symptoms consistent with COVID-19, or in those who have been in contact with the virus.  Negative results must be combined with clinical observations, patient history, and epidemiological information. The expected result is Negative. Fact Sheet for Patients: PodPark.tn Fact Sheet for Healthcare Providers: GiftContent.is This test is not yet approved or cleared by the [place] FDA and  has been authorized for detection and/or diagnosis of SARS-CoV-2 by FDA under an Emergency Use Authorization (EUA).  This EUA will remain in effect (meaning this test can be used) for the duration of  the COVID-19 de  claration under Section 564(b)(1) of the Act, 21 U.S.C. section 360bbb-3(b)(1), unless the authorization is terminated or revoked sooner.   . Lactic Acid, Venous 12/23/2019 1.0  0.5 - 1.9 mmol/L Final   Performed at [facility], [place] [address], [place], [place] [address]  . Specimen Description 12/23/2019 BLOOD RIGHT THUMB   Final  . Special Requests 12/23/2019 BOTTLES DRAWN AEROBIC AND ANAEROBIC Blood Culture adequate volume   Final  . Culture  Setup Time 12/23/2019    Final                   Value:GRAM NEGATIVE RODS IN BOTH AEROBIC AND ANAEROBIC BOTTLES GRAM POSITIVE COCCI CRITICAL RESULT CALLED TO, READ BACK BY AND VERIFIED WITH: J. FRENS PHARMD, AT 0820 12/24/19 BY [facility] Performed at [facility], [place] [address], [place], [place] [address]   . Culture 12/23/2019 GRAM NEGATIVE RODS   Final  . Report Status 12/23/2019 PENDING   Incomplete  . Specimen Description 12/23/2019 BLOOD RIGHT ARM   Final  . Special Requests 12/23/2019 BOTTLES DRAWN AEROBIC AND ANAEROBIC Blood Culture adequate volume   Final  . Culture  Setup Time 12/23/2019    Final                   Value:GRAM POSITIVE COCCI ANAEROBIC BOTTLE ONLY CRITICAL VALUE NOTED.   VALUE IS CONSISTENT WITH PREVIOUSLY REPORTED AND CALLED VALUE.   . Culture 12/23/2019    Final                   Value:CULTURE REINCUBATED FOR BETTER GROWTH Performed at [facility], [place] [address] [place], [place] [address]   . Report Status 12/23/2019 PENDING   Incomplete  . TSH 12/23/2019 24.573* 0.350 - 4.500 uIU/mL Final   Comment: Performed by a 3rd Generation assay with a functional sensitivity of <=0.01 uIU/mL. Performed at [facility], [place] [address] [place], [place] [address]   . Free T4 12/23/2019 0.66  0.61 - 1.12 ng/dL Final   Comment: (NOTE) Biotin ingestion may interfere with free T4 tests. If the results are inconsistent with the TSH level, previous test results, or the clinical presentation, then consider biotin interference. If needed, order repeat testing after stopping biotin. Performed at [facility], [place] [address], [place], [place] [address]   . aPTT 12/23/2019 36  24 - 36 seconds Final   Performed at [facility], [place] [address], [place], [place] [address]  . Prothrombin Time 12/23/2019 15.1  11.4 - 15.2 seconds Final  . INR 12/23/2019 1.2  0.8 - 1.2 Final   Comment: (NOTE) INR goal varies based on device and disease states. Performed at [facility], [place] [address], [place],  [address]   . Color, Urine 12/23/2019 YELLOW  YELLOW Final  . APPearance 12/23/2019 HAZY* CLEAR Final  . Specific Gravity, Urine 12/23/2019 1.015  1.005 - 1.030 Final  . pH 12/23/2019 9.0* 5.0 - 8.0 Final  . Glucose, UA 12/23/2019 NEGATIVE  NEGATIVE mg/dL Final  . Hgb urine dipstick 12/23/2019 NEGATIVE  NEGATIVE Final  . Bilirubin Urine 12/23/2019 NEGATIVE  NEGATIVE Final  . Ketones, ur 12/23/2019 NEGATIVE  NEGATIVE mg/dL Final  . Protein, ur 12/23/2019 100* NEGATIVE mg/dL Final  . Nitrite 12/23/2019 NEGATIVE  NEGATIVE Final  . Chalmers Guest 12/23/2019 NEGATIVE  NEGATIVE Final  . RBC / HPF 12/23/2019 0-5  0 - 5 RBC/hpf Final  . WBC, UA  12/23/2019 0-5  0 - 5 WBC/hpf Final  . Bacteria, UA 12/23/2019 FEW* NONE SEEN Final  . Squamous Epithelial / LPF 12/23/2019 0-5  0 - 5 Final  . Mucus 12/23/2019 PRESENT   Final  . Amorphous Crystal 12/23/2019 PRESENT   Final   Performed at [facility], [place] [address], [place], [place] [address]  . Troponin I (High Sensitivity) 12/23/2019 5  <18 ng/L Final   Comment: (NOTE) Elevated high sensitivity troponin I (hsTnI) values and significant  changes across serial measurements may suggest ACS but many other  chronic and acute conditions are known to elevate hsTnI results.  Refer to the "Links" section for chest pain algorithms and additional  guidance. Performed at [facility], [place] [address], [place], [place] [address]   . SARS Coronavirus 2 by RT PCR 12/23/2019 NEGATIVE  NEGATIVE Final   Comment: (NOTE) SARS-CoV-2 target nucleic acids are NOT DETECTED. The SARS-CoV-2 RNA is generally detectable in upper respiratoy specimens during the acute phase of infection. The lowest concentration of SARS-CoV-2 viral copies this assay can detect is 131 copies/mL. A negative result does not preclude SARS-Cov-2 infection and should not be used as the sole basis for treatment or other patient management decisions. A negative result may occur with  improper specimen collection/handling, submission of specimen other than nasopharyngeal swab, presence of viral mutation(s) within the areas targeted by this assay, and inadequate number of viral copies (<131 copies/mL). A negative result must be combined with clinical observations, patient history, and epidemiological information. The expected result is Negative. Fact Sheet for Patients:  PinkCheek.be Fact Sheet for Healthcare Providers:  GravelBags.it This test is not yet ap                          proved or cleared by the [place] FDA and  has been authorized for detection  and/or diagnosis of SARS-CoV-2 by FDA under an Emergency Use Authorization (EUA). This EUA will remain  in effect (meaning this test can be used) for the duration of the COVID-19 declaration under Section 564(b)(1) of the Act, 21 U.S.C. section 360bbb-3(b)(1), unless the authorization is terminated or revoked sooner.   . Influenza A by PCR 12/23/2019 NEGATIVE  NEGATIVE Final  . Influenza B by PCR 12/23/2019 NEGATIVE  NEGATIVE Final   Comment: (NOTE) The Xpert Xpress SARS-CoV-2/FLU/RSV assay is intended as an aid in  the diagnosis of influenza from Nasopharyngeal swab specimens and  should not be used as a sole basis for treatment. Nasal washings and  aspirates are unacceptable for Xpert Xpress SARS-CoV-2/FLU/RSV  testing. Fact Sheet for Patients: PinkCheek.be Fact Sheet for Healthcare Providers: GravelBags.it This test is not yet approved or cleared by the [place] FDA and  has been authorized for detection and/or diagnosis of SARS-CoV-2 by  FDA under an Emergency Use Authorization (EUA). This EUA will remain  in effect (meaning this test can be used) for the duration of the  Covid-19 declaration under Section 564(b)(1) of the Act, 21  U.S.C. section 360bbb-3(b)(1), unless the authorization is  terminated or revoked. Performed at [facility], [place] [address] [place], [place] [address]   . Troponin I (High Sensitivity) 12/24/2019 9  <18 ng/L Final   Comment: (NOTE) Elevated high sensitivity troponin I (hsTnI) values and significant  changes across serial measurements may suggest ACS but many other  chronic and acute conditions are known to elevate hsTnI results.  Refer to  the "Links" section for chest pain algorithms and additional  guidance. Performed at [facility], [place] [address], [place], [place] [address]   . Order Confirmation 12/23/2019    Final                   Value:ORDER PROCESSED BY BLOOD  BANK Performed at [facility], [place] [address], [place], [place] [address]   . HIV Screen 4th Generation wRfx 12/24/2019 NON REACTIVE  NON REACTIVE Final   Performed at [facility], [place] [address], [place], [place] [address]  . Sodium 12/24/2019 141  135 - 145 mmol/L Final  . Potassium 12/24/2019 4.8  3.5 - 5.1 mmol/L Final  . Chloride 12/24/2019 106  98 - 111 mmol/L Final  . CO2 12/24/2019 28  22 - 32 mmol/L Final  . Glucose, Bld 12/24/2019 108* 70 - 99 mg/dL Final  . BUN 12/24/2019 70* 8 - 23 mg/dL Final  . Creatinine, Ser 12/24/2019 1.23* 0.44 - 1.00 mg/dL Final  . Calcium 12/24/2019 8.5* 8.9 - 10.3 mg/dL Final  . Total Protein 12/24/2019 6.9  6.5 - 8.1 g/dL Final  . Albumin 12/24/2019 1.3* 3.5 - 5.0 g/dL Final  . AST 12/24/2019 17  15 - 41 U/L Final  . ALT 12/24/2019 38  0 - 44 U/L Final  . Alkaline Phosphatase 12/24/2019 206* 38 - 126 U/L Final  . Total Bilirubin 12/24/2019 0.7  0.3 - 1.2 mg/dL Final  . GFR calc non Af Amer 12/24/2019 46* >60 mL/min Final  . GFR calc Af Amer 12/24/2019 53* >60 mL/min Final  . Anion gap 12/24/2019 7  5 - 15 Final   Performed at [facility], [place] [address], [place], [place] [address]  . WBC 12/24/2019 0.7* 4.0 - 10.5 K/uL Final   Comment: REPEATED TO VERIFY CRITICAL VALUE NOTED.  VALUE IS CONSISTENT WITH PREVIOUSLY REPORTED AND CALLED VALUE.   Marland Kitchen RBC 12/24/2019 2.45* 3.87 - 5.11 MIL/uL Final  . Hemoglobin 12/24/2019 7.4* 12.0 - 15.0 g/dL Final  . HCT 12/24/2019 24.2* 36.0 - 46.0 % Final  . MCV 12/24/2019 98.8  80.0 - 100.0 fL Final  . MCH 12/24/2019 30.2  26.0 - 34.0 pg Final  . MCHC 12/24/2019 30.6  30.0 - 36.0 g/dL Final  . RDW 12/24/2019 18.6* 11.5 - 15.5 % Final  . Platelets 12/24/2019 245  150 - 400 K/uL Final  . nRBC 12/24/2019 0.0  0.0 - 0.2 % Final   Performed at [facility], [place] [address] [place], [place] [address]  . Ferritin 12/24/2019 948* 11 - 307 ng/mL Final   Performed at [facility],  [place] [address] [place], [place] [address]  . Iron 12/24/2019 14* 28 - 170 ug/dL Final  . TIBC 12/24/2019 116* 250 - 450 ug/dL Final  . Saturation Ratios 12/24/2019 12  10.4 - 31.8 % Final  . UIBC 12/24/2019 102  ug/dL Final   Performed at [facility], [place] [address], [place], [place] [address]  . Vitamin B-12 12/24/2019 551  180 - 914 pg/mL Final   Comment: (NOTE) This assay is not validated for testing neonatal or myeloproliferative syndrome specimens for Vitamin B12 levels. Performed at [facility], [place] [address], [place], [place] [address]   . Blood Product Unit Number 12/23/2019 [id number]   Final  . PRODUCT CODE 12/23/2019 [id number]   Final  . Unit Type and Rh 12/23/2019 5100  Final  . Blood Product Expiration Date 12/23/2019 [id number]   Final  . ISSUE DATE / TIME 12/23/2019 [id number]   Final  . Blood Product Unit Number 12/23/2019 [id number]   Final  . PRODUCT CODE 12/23/2019 [id number]   Final  . Unit Type and Rh 12/23/2019 5100   Final  . Blood Product Expiration Date 12/23/2019 [id number]   Final  . pH, Arterial 12/23/2019 7.455* 7.350 - 7.450 Final  . pCO2 arterial 12/23/2019 43.6  32.0 - 48.0 mmHg Final  . pO2, Arterial 12/23/2019 80.0* 83.0 - 108.0 mmHg Final  . Bicarbonate 12/23/2019 30.7* 20.0 - 28.0 mmol/L Final  . TCO2 12/23/2019 32  22 - 32 mmol/L Final  . O2 Saturation 12/23/2019 96.0  % Final  . Acid-Base Excess 12/23/2019 6.0* 0.0 - 2.0 mmol/L Final  . Sodium 12/23/2019 144  135 - 145 mmol/L Final  . Potassium 12/23/2019 4.9  3.5 - 5.1 mmol/L Final  . Calcium, Ion 12/23/2019 1.28  1.15 - 1.40 mmol/L Final  . HCT 12/23/2019 23.0* 36.0 - 46.0 % Final  . Hemoglobin 12/23/2019 7.8* 12.0 - 15.0 g/dL Final  . Patient temperature 12/23/2019 98.3 F   Final  . Collection site 12/23/2019 RADIAL, ALLEN'S TEST ACCEPTABLE   Final  . Sample type 12/23/2019 ARTERIAL   Final  . Glucose-Capillary 12/23/2019 99  70 - 99 mg/dL Final  . Procalcitonin  12/24/2019 0.32  ng/mL Final   Comment:        Interpretation: PCT (Procalcitonin) <= 0.5 ng/mL: Systemic infection (sepsis) is not likely. Local bacterial infection is possible. (NOTE)       Sepsis PCT Algorithm           Lower Respiratory Tract                                      Infection PCT Algorithm    ----------------------------     ----------------------------         PCT < 0.25 ng/mL                PCT < 0.10 ng/mL         Strongly encourage             Strongly discourage   discontinuation of antibiotics    initiation of antibiotics    ----------------------------     -----------------------------       PCT 0.25 - 0.50 ng/mL            PCT 0.10 - 0.25 ng/mL               OR       >80% decrease in PCT            Discourage initiation of                                            antibiotics      Encourage discontinuation           of antibiotics    ----------------------------     -----------------------------         PCT >= 0.50 ng/mL              PCT 0.26 - 0.50 ng/mL  AND                                 <80% decrease in PCT             Encourage initiation of                                             antibiotics       Encourage continuation           of antibiotics    ----------------------------     -----------------------------        PCT >= 0.50 ng/mL                  PCT > 0.50 ng/mL               AND         increase in PCT                  Strongly encourage                                      initiation of antibiotics    Strongly encourage escalation           of antibiotics                                     -----------------------------                                           PCT <= 0.25 ng/mL                                                 OR                                        > 80% decrease in PCT                                     Discontinue / Do not initiate                                             antibiotics Performed  at [facility], [place] [address] [place], [place] [address]   . GGT 12/24/2019 25  7 - 50 U/L Final   Performed at [facility], [place] [address], [place], [place] [address]  . Strep Pneumo Urinary Antigen 12/24/2019 NEGATIVE  NEGATIVE Final   Performed at [facility], [place] [address] [place], [place] [address]  . Glucose-Capillary 12/24/2019 104* 70 - 99 mg/dL Final  .  Glucose-Capillary 12/24/2019 89  70 - 99 mg/dL Final  . Enterococcus species 12/23/2019 NOT DETECTED  NOT DETECTED Final  . Listeria monocytogenes 12/23/2019 NOT DETECTED  NOT DETECTED Final  . Staphylococcus species 12/23/2019 DETECTED* NOT DETECTED Final   Comment: CRITICAL RESULT CALLED TO, READ BACK BY AND VERIFIED WITH: J. FRENS PHARMD, AT 5397 12/24/19 BY D. VANHOOK   . Staphylococcus aureus (BCID) 12/23/2019 DETECTED* NOT DETECTED Final   Comment: Methicillin (oxacillin)-resistant Staphylococcus aureus (MRSA). MRSA is predictably resistant to beta-lactam antibiotics (except ceftaroline). Preferred therapy is vancomycin unless clinically contraindicated. Patient requires contact precautions if  hospitalized. CRITICAL RESULT CALLED TO, READ BACK BY AND VERIFIED WITH: J. FRENS PHARMD, AT [id number] 12/24/19 BY D. VANHOOK   . Methicillin resistance 12/23/2019 DETECTED* NOT DETECTED Final   Comment: CRITICAL RESULT CALLED TO, READ BACK BY AND VERIFIED WITH: J. FRENS PHARMD, AT 1937 12/24/19 BY D. VANHOOK   . Streptococcus species 12/23/2019 NOT DETECTED  NOT DETECTED Final  . Streptococcus agalactiae 12/23/2019 NOT DETECTED  NOT DETECTED Final  . Streptococcus pneumoniae 12/23/2019 NOT DETECTED  NOT DETECTED Final  . Streptococcus pyogenes 12/23/2019 NOT DETECTED  NOT DETECTED Final  . Acinetobacter baumannii 12/23/2019 NOT DETECTED  NOT DETECTED Final  . Enterobacteriaceae species 12/23/2019 DETECTED* NOT DETECTED Final   Comment: Enterobacteriaceae represent a large family of gram-negative bacteria, not a  single organism. CRITICAL RESULT CALLED TO, READ BACK BY AND VERIFIED WITH: J. FRENS PHARMD, AT 9024 12/24/19 BY D. VANHOOK   . Enterobacter cloacae complex 12/23/2019 NOT DETECTED  NOT DETECTED Final  . Escherichia coli 12/23/2019 DETECTED* NOT DETECTED Final   Comment: CRITICAL RESULT CALLED TO, READ BACK BY AND VERIFIED WITH: J. FRENS PHARMD, AT 0973 12/24/19 BY D. VANHOOK   . Klebsiella oxytoca 12/23/2019 NOT DETECTED  NOT DETECTED Final  . Klebsiella pneumoniae 12/23/2019 NOT DETECTED  NOT DETECTED Final  . Proteus species 12/23/2019 NOT DETECTED  NOT DETECTED Final  . Serratia marcescens 12/23/2019 NOT DETECTED  NOT DETECTED Final  . Carbapenem resistance 12/23/2019 NOT DETECTED  NOT DETECTED Final  . Haemophilus influenzae 12/23/2019 NOT DETECTED  NOT DETECTED Final  . Neisseria meningitidis 12/23/2019 NOT DETECTED  NOT DETECTED Final  . Pseudomonas aeruginosa 12/23/2019 NOT DETECTED  NOT DETECTED Final  . Candida albicans 12/23/2019 NOT DETECTED  NOT DETECTED Final  . Candida glabrata 12/23/2019 NOT DETECTED  NOT DETECTED Final  . Candida krusei 12/23/2019 NOT DETECTED  NOT DETECTED Final  . Candida parapsilosis 12/23/2019 NOT DETECTED  NOT DETECTED Final  . Candida tropicalis 12/23/2019 NOT DETECTED  NOT DETECTED Final   Performed at [facility], [address], [place], [place] [address]  . Glucose-Capillary 12/24/2019 93  70 - 99 mg/dL Final    (this displays the last labs from the last 3 days)  No results found for: TOTALPROTELP, ALBUMINELP, A1GS, A2GS, BETS, BETA2SER, GAMS, MSPIKE, SPEI (this displays SPEP labs)  No results found for: KPAFRELGTCHN, LAMBDASER, KAPLAMBRATIO (kappa/lambda light chains)  No results found for: HGBA, HGBA2QUANT, HGBFQUANT, HGBSQUAN (Hemoglobinopathy evaluation)   No results found for: LDH  Lab Results  Component Value Date   IRON 14 (L) 12/24/2019   TIBC 116 (L) 12/24/2019   IRONPCTSAT 12 12/24/2019   (Iron and TIBC)  Lab  Results  Component Value Date   FERRITIN 948 (H) 12/24/2019    Urinalysis    Component Value Date/Time   COLORURINE YELLOW 12/23/2019 1623   APPEARANCEUR HAZY (A) 12/23/2019 1623   LABSPEC 1.015 12/23/2019  [place] 9.0 (H) 12/23/2019 1623   GLUCOSEU NEGATIVE 12/23/2019 1623   HGBUR NEGATIVE 12/23/2019 1623   BILIRUBINUR NEGATIVE 12/23/2019 1623   KETONESUR NEGATIVE 12/23/2019 1623   PROTEINUR 100 (A) 12/23/2019 1623   NITRITE NEGATIVE 12/23/2019 1623   LEUKOCYTESUR NEGATIVE 12/23/2019 1623     STUDIES: DG Chest Port 1 View  Result Date: 12/23/2019 CLINICAL DATA:  Sepsis EXAM: PORTABLE CHEST 1 VIEW COMPARISON:  None. FINDINGS: There are moderate to large bilateral pleural effusions. The heart size is enlarged. There is scattered bilateral airspace opacities. There are prominent interstitial lung markings. There are dense bibasilar airspace opacities. There is no pneumothorax. There are old healed left-sided rib fractures. IMPRESSION: 1. Cardiomegaly with volume overload including moderate bilateral pleural effusions. 2. Scattered bilateral airspace opacities may be secondary to developing pulmonary edema or an atypical infectious process. 3. Bibasilar airspace opacities favored to represent atelectasis with an infiltrate not excluded. Electronically Signed   By: Constance Holster M.D.   On: 12/23/2019 17:25    ELIGIBLE FOR AVAILABLE RESEARCH PROTOCOL: no  ASSESSMENT: 64 y.o. [facility] resident with history of epidural abscess/stroke, with paraplegia, trach/ventilator dependent, presenting with severe leukopenia and anemia but no thrombocytopenia  PLAN: I will not be able to examine the patient until later in the day, but I have discussed her peripheral blood film with pathology (Dr. Melina Copa).  She does not see evidence of leukemia in the peripheral blood film.  There are increased eosinophils, and there are some platelet clumps indicating that the platelet count is  actually higher than what is noted.  I have contacted [facility] to obtain the data above and I have a call out to the [facility] facility in [place] to see if we can obtain earlier lab work.  I do not see any obvious medications that could explain her pattern of cytopenias.  The patient has an elevated ferritin at 948 with iron saturation of 12%, and normal B12 at 551.  Folate and SPEP are pending.  I will add a reticulocyte count, DAT, ESR, and ANA.  However this is a very unusual presentation.  The patient's red cells are macrocytic.  Despite the high platelet count this may be a primary bone marrow issue and the patient will need a bone marrow biopsy.  I will write for that as well  We will follow with you  Chauncey Cruel, MD   12/24/2019 8:53 AM Medical Oncology and Hematology [facility] [address] [place], [place] [address] Tel. [phone]    Fax. [phone]

## 2019-12-24 NOTE — Progress Notes (Signed)
Pharmacy Antibiotic Note  Parnika Tweten is a 64 y.o. female with h/o quadraplegia admitted on 12/23/2019 with possible HCAP .  Pharmacy has been consulted for Vancomycin and Cefepime  Dosing.     Plan: Vancomycin 1250 mg IV now, then 1 g IV q48h F/U renal function Cefepime 2 g IV q12h  Height: 5\' 10"  (177.8 cm) IBW/kg (Calculated) : 68.5  Est wt 80 kg  Temp (24hrs), Avg:97.7 F (36.5 C), Min:95.5 F (35.3 C), Max:98.5 F (36.9 C)  Recent Labs  Lab 12/23/19 1604 12/23/19 1610 12/24/19 0232  WBC 0.8*  --  0.7*  CREATININE 1.24*  --  1.23*  LATICACIDVEN  --  1.0  --     CrCl cannot be calculated (Unknown ideal weight.).    Allergies  Allergen Reactions  . Codeine   . Hydrocodone   . Influenza A (H1n1) Monovalent Vaccine   . Latex   . Morphine And Related   . Penicillins   . Shellfish Allergy   . Tape     02/21/20 12/24/2019 5:02 AM

## 2019-12-25 ENCOUNTER — Inpatient Hospital Stay (HOSPITAL_COMMUNITY): Payer: Medicare PPO

## 2019-12-25 DIAGNOSIS — Z950 Presence of cardiac pacemaker: Secondary | ICD-10-CM

## 2019-12-25 DIAGNOSIS — Z981 Arthrodesis status: Secondary | ICD-10-CM

## 2019-12-25 DIAGNOSIS — J9621 Acute and chronic respiratory failure with hypoxia: Secondary | ICD-10-CM

## 2019-12-25 DIAGNOSIS — Z792 Long term (current) use of antibiotics: Secondary | ICD-10-CM

## 2019-12-25 DIAGNOSIS — Z960 Presence of urogenital implants: Secondary | ICD-10-CM

## 2019-12-25 DIAGNOSIS — Z1612 Extended spectrum beta lactamase (ESBL) resistance: Secondary | ICD-10-CM

## 2019-12-25 DIAGNOSIS — F419 Anxiety disorder, unspecified: Secondary | ICD-10-CM

## 2019-12-25 DIAGNOSIS — M7989 Other specified soft tissue disorders: Secondary | ICD-10-CM

## 2019-12-25 LAB — CBC WITH DIFFERENTIAL/PLATELET
Abs Immature Granulocytes: 0 10*3/uL (ref 0.00–0.07)
Basophils Absolute: 0 10*3/uL (ref 0.0–0.1)
Basophils Relative: 0 %
Eosinophils Absolute: 0.1 10*3/uL (ref 0.0–0.5)
Eosinophils Relative: 17 %
HCT: 23.5 % — ABNORMAL LOW (ref 36.0–46.0)
Hemoglobin: 7.1 g/dL — ABNORMAL LOW (ref 12.0–15.0)
Immature Granulocytes: 0 %
Lymphocytes Relative: 65 %
Lymphs Abs: 0.5 10*3/uL — ABNORMAL LOW (ref 0.7–4.0)
MCH: 29.3 pg (ref 26.0–34.0)
MCHC: 30.2 g/dL (ref 30.0–36.0)
MCV: 97.1 fL (ref 80.0–100.0)
Monocytes Absolute: 0.1 10*3/uL (ref 0.1–1.0)
Monocytes Relative: 17 %
Neutro Abs: 0 10*3/uL — ABNORMAL LOW (ref 1.7–7.7)
Neutrophils Relative %: 1 %
Platelets: 207 10*3/uL (ref 150–400)
RBC: 2.42 MIL/uL — ABNORMAL LOW (ref 3.87–5.11)
RDW: 19 % — ABNORMAL HIGH (ref 11.5–15.5)
WBC: 0.8 10*3/uL — CL (ref 4.0–10.5)
nRBC: 0 % (ref 0.0–0.2)

## 2019-12-25 LAB — DIRECT ANTIGLOBULIN TEST (NOT AT ARMC)
DAT, IgG: POSITIVE
DAT, complement: NEGATIVE

## 2019-12-25 LAB — PROTEIN ELECTROPHORESIS, SERUM
A/G Ratio: 0.4 — ABNORMAL LOW (ref 0.7–1.7)
Albumin ELP: 1.8 g/dL — ABNORMAL LOW (ref 2.9–4.4)
Alpha-1-Globulin: 0.2 g/dL (ref 0.0–0.4)
Alpha-2-Globulin: 0.8 g/dL (ref 0.4–1.0)
Beta Globulin: 0.9 g/dL (ref 0.7–1.3)
Gamma Globulin: 3.2 g/dL — ABNORMAL HIGH (ref 0.4–1.8)
Globulin, Total: 5 g/dL — ABNORMAL HIGH (ref 2.2–3.9)
Total Protein ELP: 6.8 g/dL (ref 6.0–8.5)

## 2019-12-25 LAB — PROCALCITONIN: Procalcitonin: 0.32 ng/mL

## 2019-12-25 LAB — URINALYSIS, ROUTINE W REFLEX MICROSCOPIC
Bilirubin Urine: NEGATIVE
Glucose, UA: NEGATIVE mg/dL
Hgb urine dipstick: NEGATIVE
Ketones, ur: 5 mg/dL — AB
Leukocytes,Ua: NEGATIVE
Nitrite: NEGATIVE
Protein, ur: 30 mg/dL — AB
Specific Gravity, Urine: 1.016 (ref 1.005–1.030)
WBC, UA: >50 WBC/hpf — ABNORMAL HIGH (ref 0–5)
pH: 6 (ref 5.0–8.0)

## 2019-12-25 LAB — GLUCOSE, CAPILLARY
Glucose-Capillary: 80 mg/dL (ref 70–99)
Glucose-Capillary: 86 mg/dL (ref 70–99)
Glucose-Capillary: 87 mg/dL (ref 70–99)
Glucose-Capillary: 88 mg/dL (ref 70–99)
Glucose-Capillary: 89 mg/dL (ref 70–99)
Glucose-Capillary: 91 mg/dL (ref 70–99)

## 2019-12-25 LAB — COMPREHENSIVE METABOLIC PANEL
ALT: 33 U/L (ref 0–44)
AST: 14 U/L — ABNORMAL LOW (ref 15–41)
Albumin: 1.3 g/dL — ABNORMAL LOW (ref 3.5–5.0)
Alkaline Phosphatase: 175 U/L — ABNORMAL HIGH (ref 38–126)
Anion gap: 10 (ref 5–15)
BUN: 76 mg/dL — ABNORMAL HIGH (ref 8–23)
CO2: 26 mmol/L (ref 22–32)
Calcium: 8.3 mg/dL — ABNORMAL LOW (ref 8.9–10.3)
Chloride: 107 mmol/L (ref 98–111)
Creatinine, Ser: 1.39 mg/dL — ABNORMAL HIGH (ref 0.44–1.00)
GFR calc Af Amer: 46 mL/min — ABNORMAL LOW (ref >60)
GFR calc non Af Amer: 40 mL/min — ABNORMAL LOW (ref >60)
Glucose, Bld: 103 mg/dL — ABNORMAL HIGH (ref 70–99)
Potassium: 4.4 mmol/L (ref 3.5–5.1)
Sodium: 143 mmol/L (ref 135–145)
Total Bilirubin: 0.7 mg/dL (ref 0.3–1.2)
Total Protein: 6.6 g/dL (ref 6.5–8.1)

## 2019-12-25 LAB — ANTINUCLEAR ANTIBODIES, IFA: ANA Ab, IFA: NEGATIVE

## 2019-12-25 LAB — URINE CULTURE: Culture: >=100000 — AB

## 2019-12-25 LAB — RETICULOCYTES
Immature Retic Fract: 18.4 % — ABNORMAL HIGH (ref 2.3–15.9)
RBC.: 2.34 MIL/uL — ABNORMAL LOW (ref 3.87–5.11)
Retic Count, Absolute: 52.4 10*3/uL (ref 19.0–186.0)
Retic Ct Pct: 2.2 % (ref 0.4–3.1)

## 2019-12-25 LAB — PATHOLOGIST SMEAR REVIEW: Path Review: INCREASED

## 2019-12-25 LAB — FOLATE RBC
Folate, Hemolysate: 418 ng/mL
Folate, RBC: 1935 ng/mL (ref >498)
Hematocrit: 21.6 % — ABNORMAL LOW (ref 34.0–46.6)

## 2019-12-25 LAB — LEGIONELLA PNEUMOPHILA SEROGP 1 UR AG: L. pneumophila Serogp 1 Ur Ag: NEGATIVE

## 2019-12-25 MED ORDER — RIFAMPIN ORAL SUSPENSION 25 MG/ML
600.0000 mg | Freq: Every day | ORAL | Status: DC
  Administered 2019-12-26 – 2020-01-04 (×10): 600 mg
  Filled 2019-12-25 (×12): qty 10

## 2019-12-25 MED ORDER — JEVITY 1.5 CAL/FIBER PO LIQD
1000.0000 mL | ORAL | Status: DC
  Administered 2019-12-25 – 2020-01-04 (×11): 1000 mL
  Filled 2019-12-25 (×18): qty 1000

## 2019-12-25 MED ORDER — JUVEN PO PACK
1.0000 | PACK | Freq: Two times a day (BID) | ORAL | Status: DC
  Administered 2019-12-25 – 2020-01-07 (×26): 1
  Filled 2019-12-25 (×27): qty 1

## 2019-12-25 MED ORDER — LORAZEPAM 2 MG/ML PO CONC
1.0000 mg | ORAL | Status: DC | PRN
  Administered 2019-12-25 – 2020-01-07 (×23): 1 mg
  Filled 2019-12-25 (×3): qty 1
  Filled 2019-12-25: qty 0.5
  Filled 2019-12-25 (×2): qty 1
  Filled 2019-12-25: qty 0.5
  Filled 2019-12-25 (×17): qty 1

## 2019-12-25 MED ORDER — FERROUS GLUCONATE 324 (38 FE) MG PO TABS
324.0000 mg | ORAL_TABLET | Freq: Every day | ORAL | Status: DC
  Filled 2019-12-25: qty 1

## 2019-12-25 MED ORDER — SODIUM CHLORIDE 0.9 % IV SOLN
1.0000 g | Freq: Three times a day (TID) | INTRAVENOUS | Status: DC
  Administered 2019-12-25 – 2019-12-26 (×3): 1 g via INTRAVENOUS
  Filled 2019-12-25 (×4): qty 1

## 2019-12-25 MED ORDER — RIFAMPIN ORAL SUSPENSION 25 MG/ML
600.0000 mg | Freq: Every day | ORAL | Status: DC
  Administered 2019-12-25: 600 mg
  Filled 2019-12-25 (×2): qty 10

## 2019-12-25 MED ORDER — JEVITY 1.5 CAL/FIBER PO LIQD
1000.0000 mL | ORAL | Status: DC
  Filled 2019-12-25: qty 1000

## 2019-12-25 MED ORDER — PRO-STAT SUGAR FREE PO LIQD
30.0000 mL | Freq: Three times a day (TID) | ORAL | Status: DC
  Administered 2019-12-25 – 2020-01-07 (×39): 30 mL
  Filled 2019-12-25 (×38): qty 30

## 2019-12-25 NOTE — Progress Notes (Signed)
Right upper extremity venous duplex has been completed. Preliminary results can be found in CV Proc through chart review.   12/25/19 1:52 PM Olen Cordial RVT

## 2019-12-25 NOTE — Progress Notes (Signed)
I discussed BMBX with patient. She refuses procedure.  I am not uncomfortable with that decision at this point--it may well be all we are dealing with is her infection. If the counts do not significantly improve after a week or so of antibiotics, I will re-discuss BMBX with Gabrielle Wade  Will follow peripherally  GM

## 2019-12-25 NOTE — Evaluation (Signed)
Passy-Muir Speaking Valve - Evaluation Patient Details  Name: Gabrielle Wade MRN: [id number] Date of Birth: Apr 20, 1956  Today's Date: 12/25/2019 Time: 1206-1236 SLP Time Calculation (min) (ACUTE ONLY): 30 min  Past Medical History:  Past Medical History:  Diagnosis Date  . Acute and chronic respiratory failure (acute-on-chronic) (HCC)   . Acute posthemorrhagic anemia   . Anemia in other chronic diseases classified elsewhere   . Anxiety   . Anxiety   . Asthma   . Bradycardia   . Chronic kidney disease, stage 3   . Compartment syndrome, unspecified, sequela   . COPD (chronic obstructive pulmonary disease) (HCC)   . 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 Diabetes mellitus without complication (HCC)   . Dysphagia, oropharyngeal   . GERD (gastroesophageal reflux disease)   . Hyperkalemia   . Hypotension   . Intraspinal abscess and granuloma   . MDD (major depressive disorder)   . MRSA (methicillin resistant Staphylococcus aureus)   . Presence of cardiac pacemaker   . Pressure ulcer of unspecified heel, unspecified stage   . PTSD (post-traumatic stress disorder)   . Quadriplegia [facility])    Past Surgical History:  Past Surgical History:  Procedure Laterality Date  . PEG TUBE PLACEMENT    . TRACHEOSTOMY     HPI:  Gabrielle Wade  is a 64 y.o. female, w anxiety/ depression, PTSD,  hx of epidural abscess (mrsa), w quadriplegia , Dm2, CKD stage3,  Copd, Chronic Trach/ vent dependence, GERD, chronic dysphagia, PEG, Anemia, presents with ? Thrombocytopenia, Hgb 6.8, and wbc 0.6 per [facility]. Found to have sepsis, neutropenia, AKI, hypothermia, hypoxia, bacteremia. NO ST notes found in system.   Assessment / Plan / Recommendation Clinical Impression  RT and SLP collaboration for inline ventilator Passy-Muir valve speaking assessment with pt on PRVC setting. She tolerated cuff deflation following deep suction with RT. Coughs/gags on secretions and cued to continue coughing as SLP orally  suctioned. Two factors affected speech intelligibility of air leak from around trach stoma (equipment heavy and angled down) and discoordination of speech and respiration. Verbal and visual cues demonstrating verbalizations during exhalation and RT held vent tubing/trach hub closer to neck. Vitals remained stable. Recommend continued trials with PMV/vent or if on trach collar. Swallow assessment ordered and will assess tomorrow (likely needs FEES).    SLP Visit Diagnosis: Aphonia (R49.1)    SLP Assessment  Patient needs continued Speech Lanaguage Pathology Services    Follow Up Recommendations  LTACH    Frequency and Duration min 1 x/week  2 weeks    PMSV Trial PMSV was placed for: intervals 3 min x 3 Able to redirect subglottic air through upper airway: Yes Able to Attain Phonation: Yes Voice Quality: Other (comment)(vocal breaks) Able to Expectorate Secretions: Yes Level of Secretion Expectoration with PMSV: Oral Breath Support for Phonation: Moderately decreased Intelligibility: Intelligible Respirations During Trial: 20 SpO2 During Trial: 98 % Pulse During Trial: 78 Behavior: Alert   Tracheostomy Tube       Vent Dependency  Vent Mode: PRVC Set Rate: 12 bmp PEEP: 5 cmH20 FiO2 (%): 40 % Vt Set: 480 mL    Cuff Deflation Trial  GO Tolerated Cuff Deflation: Yes(with RT) Length of Time for Cuff Deflation Trial: 20 Behavior: Alert(mildy anxious )        Royce Macadamia 12/25/2019, 2:30 PM  Breck Coons Jehieli Brassell M.Ed Nurse, children's [phone] Office [phone]

## 2019-12-25 NOTE — Progress Notes (Signed)
HEMATOLOGY-ONCOLOGY PROGRESS NOTE  SUBJECTIVE: Resting quietly in bed. Refused bone marrow biopsy. I have again explained the procedure to her and he does not want to proceed. Afebrile. BC are growing MRSA and E Coli. ID is following.   REVIEW OF SYSTEMS:   No fevers or bleeding. Remainder of the ROS is noncontributory.   I have reviewed the past medical history, past surgical history, social history and family history with the patient and they are unchanged from previous note.   PHYSICAL EXAMINATION:  Vitals:   12/25/19 1122 12/25/19 1200  BP:  (!) 123/49  Pulse:  66  Resp:  13  Temp: (!) 97.5 F (36.4 C)   SpO2:  100%   Filed Weights   12/24/19 1745 12/25/19 0500  Weight: 201 lb 4.5 oz (91.3 kg) 200 lb 13.4 oz (91.1 kg)    Intake/Output from previous day: 01/05 0701 - 01/06 0700 In: 740 [IV Piggyback:450] Out: 0037 [Urine:1450]  GENERAL:chronically ill appearing female. No distress.  LUNGS: coarse bilaterally HEART: regular rate & rhythm and no murmurs  ABDOMEN:abdomen soft, non-tender and normal bowel sounds Musculoskeletal:no cyanosis of digits and no clubbing  NEURO: alert, difficult to understand  LABORATORY DATA:  I have reviewed the data as listed CMP Latest Ref Rng & Units 12/25/2019 12/24/2019 12/23/2019  Glucose 70 - 99 mg/dL 103(H) 108(H) -  BUN 8 - 23 mg/dL 76(H) 70(H) -  Creatinine 0.44 - 1.00 mg/dL 1.39(H) 1.23(H) -  Sodium 135 - 145 mmol/L 143 141 144  Potassium 3.5 - 5.1 mmol/L 4.4 4.8 4.9  Chloride 98 - 111 mmol/L 107 106 -  CO2 22 - 32 mmol/L 26 28 -  Calcium 8.9 - 10.3 mg/dL 8.3(L) 8.5(L) -  Total Protein 6.5 - 8.1 g/dL 6.6 6.9 -  Total Bilirubin 0.3 - 1.2 mg/dL 0.7 0.7 -  Alkaline Phos 38 - 126 U/L 175(H) 206(H) -  AST 15 - 41 U/L 14(L) 17 -  ALT 0 - 44 U/L 33 38 -    Lab Results  Component Value Date   WBC 0.8 (LL) 12/25/2019   HGB 7.1 (L) 12/25/2019   HCT 23.5 (L) 12/25/2019   MCV 97.1 12/25/2019   PLT 207 12/25/2019   NEUTROABS 0.0 (L)  12/25/2019    CT CHEST WO CONTRAST  Result Date: 12/24/2019 CLINICAL DATA:  Shortness of breath and chest pain EXAM: CT CHEST WITHOUT CONTRAST TECHNIQUE: Multidetector CT imaging of the chest was performed following the standard protocol without IV contrast. COMPARISON:  Chest x-ray from the previous day. FINDINGS: Cardiovascular: Somewhat limited due to lack of IV contrast. Aortic calcifications are seen without aneurysmal dilatation. No significant cardiac enlargement is seen. Implantable pacemaker is noted within the right ventricle. No significant coronary calcifications are noted. Mediastinum/Nodes: Thoracic inlet demonstrates a tracheostomy tube in satisfactory position. No focal mass lesion is seen. Scattered small mediastinal and hilar lymph nodes are seen likely reactive in nature given the findings in the lungs. The esophagus appears within normal limits. Lungs/Pleura: Large pleural effusions are noted bilaterally right slightly greater than left with associated lower lobe consolidation. Changes of mucous plugging are noted within the lower lobe bronchial tree on the left. No large central mass is seen. The upper lobes demonstrates some posterior dependent atelectatic changes. Small apical nodules are noted bilaterally. The largest of these is seen in the right upper lobe measuring approximately 6 mm. Upper Abdomen: Visualized upper abdomen shows no acute abnormality Musculoskeletal: Multilevel degenerative changes of the thoracic spine are  seen. No acute bony abnormality is noted. Postsurgical changes in the cervicothoracic spine are seen. IMPRESSION: Large bilateral pleural effusions with underlying consolidation in the lower lobes and upper lobe atelectatic changes. These changes are consistent with bilateral pneumonia. Reactive lymphadenopathy is seen. Small nodular densities within the apices bilaterally. Non-contrast chest CT at 3-6 months is recommended. If the nodules are stable at time of  repeat CT, then future CT at 18-24 months (from today's scan) is considered optional for low-risk patients, but is recommended for high-risk patients. This recommendation follows the consensus statement: Guidelines for Management of Incidental Pulmonary Nodules Detected on CT Images: From the Fleischner Society 2017; Radiology 2017; 284:228-243. Aortic Atherosclerosis (ICD10-I70.0). Electronically Signed   By: Inez Catalina M.D.   On: 12/24/2019 17:30   DG Chest Port 1 View  Result Date: 12/23/2019 CLINICAL DATA:  Sepsis EXAM: PORTABLE CHEST 1 VIEW COMPARISON:  None. FINDINGS: There are moderate to large bilateral pleural effusions. The heart size is enlarged. There is scattered bilateral airspace opacities. There are prominent interstitial lung markings. There are dense bibasilar airspace opacities. There is no pneumothorax. There are old healed left-sided rib fractures. IMPRESSION: 1. Cardiomegaly with volume overload including moderate bilateral pleural effusions. 2. Scattered bilateral airspace opacities may be secondary to developing pulmonary edema or an atypical infectious process. 3. Bibasilar airspace opacities favored to represent atelectasis with an infiltrate not excluded. Electronically Signed   By: Constance Holster M.D.   On: 12/23/2019 17:25   ECHOCARDIOGRAM COMPLETE  Result Date: 12/24/2019   ECHOCARDIOGRAM REPORT   Patient Name:   Gabrielle Wade Date of Exam: 12/24/2019 Medical Rec #:  [id number]      Height:       70.0 in Accession #:    [id number]     Weight:       201.0 lb Date of Birth:  1956/05/28     BSA:          2.09 m Patient Age:    64 years       BP:           118/43 mmHg Patient Gender: F              HR:           69 bpm. Exam Location:  Inpatient Procedure: 2D Echo Indications:    Dyspnea R06.00  History:        Patient has no prior history of Echocardiogram examinations.                 COPD; Risk Factors:Diabetes.  Sonographer:    Mikki Santee RDCS (AE) Referring Phys: [place]  1. Left ventricular ejection fraction, by visual estimation, is 55 to 60%. The left ventricle has normal function. There is no left ventricular hypertrophy.  2. Left ventricular diastolic parameters are consistent with Grade II diastolic dysfunction (pseudonormalization).  3. Elevated left ventricular end-diastolic pressure.  4. Global right ventricle has normal systolic function.The right ventricular size is normal. No increase in right ventricular wall thickness.  5. Left atrial size was normal.  6. Right atrial size was normal.  7. The mitral valve is normal in structure. Mild mitral valve regurgitation. No evidence of mitral stenosis.  8. The tricuspid valve is normal in structure.  9. The aortic valve was not well visualized. Aortic valve regurgitation is not visualized. No evidence of aortic valve sclerosis or stenosis. 10. The pulmonic valve was normal in structure. Pulmonic valve regurgitation is trivial.  11. Normal pulmonary artery systolic pressure. 12. The inferior vena cava is dilated in size with >50% respiratory variability, suggesting right atrial pressure of 8 mmHg. FINDINGS  Left Ventricle: Left ventricular ejection fraction, by visual estimation, is 55 to 60%. The left ventricle has normal function. The left ventricle is not well visualized. There is no left ventricular hypertrophy. Left ventricular diastolic parameters are consistent with Grade II diastolic dysfunction (pseudonormalization). Elevated left ventricular end-diastolic pressure. Right Ventricle: The right ventricular size is normal. No increase in right ventricular wall thickness. Global RV systolic function is has normal systolic function. The tricuspid regurgitant velocity is 1.83 m/s, and with an assumed right atrial pressure  of 8 mmHg, the estimated right ventricular systolic pressure is normal at 21.4 mmHg. Left Atrium: Left atrial size was normal in size. Right Atrium: Right atrial size was normal in size  Pericardium: There is no evidence of pericardial effusion. Mitral Valve: The mitral valve is normal in structure. Mild mitral valve regurgitation. No evidence of mitral valve stenosis by observation. Tricuspid Valve: The tricuspid valve is normal in structure. Tricuspid valve regurgitation is trivial. Aortic Valve: The aortic valve was not well visualized. Aortic valve regurgitation is not visualized. The aortic valve is structurally normal, with no evidence of sclerosis or stenosis. Pulmonic Valve: The pulmonic valve was normal in structure. Pulmonic valve regurgitation is trivial. Pulmonic regurgitation is trivial. Aorta: The aortic root, ascending aorta and aortic arch are all structurally normal, with no evidence of dilitation or obstruction. Venous: The inferior vena cava is dilated in size with greater than 50% respiratory variability, suggesting right atrial pressure of 8 mmHg. IAS/Shunts: No atrial level shunt detected by color flow Doppler. There is no evidence of a patent foramen ovale. No ventricular septal defect is seen or detected. There is no evidence of an atrial septal defect.  LEFT VENTRICLE PLAX 2D LVIDd:         5.20 cm  Diastology LVIDs:         3.50 cm  LV e' lateral:   6.42 cm/s LV PW:         0.70 cm  LV E/e' lateral: 19.0 LV IVS:        0.80 cm  LV e' medial:    6.09 cm/s LVOT diam:     2.10 cm  LV E/e' medial:  20.0 LV SV:         79 ml LV SV Index:   36.71 LVOT Area:     3.46 cm  RIGHT VENTRICLE RV S prime:     11.00 cm/s TAPSE (M-mode): 2.2 cm LEFT ATRIUM             Index       RIGHT ATRIUM           Index LA diam:        4.10 cm 1.96 cm/m  RA Area:     16.40 cm LA Vol (A2C):   37.2 ml 17.78 ml/m RA Volume:   42.20 ml  20.17 ml/m LA Vol (A4C):   33.8 ml 16.16 ml/m LA Biplane Vol: 37.9 ml 18.12 ml/m  AORTIC VALVE LVOT Vmax:   90.50 cm/s LVOT Vmean:  57.500 cm/s LVOT VTI:    0.200 m  AORTA Ao Root diam: 3.10 cm MITRAL VALVE                         TRICUSPID VALVE MV Area (PHT): 3.65  cm  TR Peak grad:   13.4 mmHg MV PHT:        60.32 msec            TR Vmax:        183.00 cm/s MV Decel Time: 208 msec MV E velocity: 122.00 cm/s 103 cm/s  SHUNTS MV A velocity: 98.80 cm/s  70.3 cm/s Systemic VTI:  0.20 m MV E/A ratio:  1.23        1.5       Systemic Diam: 2.10 cm  Cherlynn Kaiser MD Electronically signed by Cherlynn Kaiser MD Signature Date/Time: 12/24/2019/8:36:20 PM    Final     ASSESSMENT: 64 y.o. [facility] resident with history of epidural abscess/stroke, with paraplegia, trach/ventilator dependent, presenting with severe leukopenia and anemia but no thrombocytopenia  PLAN: Hemoglobin and WBC remain low, but stable. Peripheral blood per Dr. Melina Copa did not show evidence of leukemia in the peripheral blood film. There are increased eosinophils, and there are some platelet clumps indicating that the platelet count is actually higher than what is noted.  SPEP pending. Folate RBC pending. ANA negative. ESR elevated at 125. Reticulocyte and DAT not yet obtained. I will reorder.   Despite the high platelet count this may be a primary bone marrow issue and recommend a bone marrow biopsy. The patient has declined this test. Will readdress if counts do not improve after about a week of antibiotics.   We will follow with you    LOS: 2 days   Mikey Bussing, DNP, AGPCNP-BC, AOCNP 12/25/19

## 2019-12-25 NOTE — Progress Notes (Signed)
Initial Nutrition Assessment  DOCUMENTATION CODES:   Not applicable  INTERVENTION:    Jevity 1.5 at 50 ml/h via PEG.   Pro-stat 30 ml TID.   Provides 2100 kcal, 122 gm protein, 912 ml free water daily.   Juven BID via PEG, each packet provides 80 calories, 8 grams of carbohydrate, 2.5  grams of protein (collagen), 7 grams of L-arginine and 7 grams of L-glutamine; supplement contains CaHMB, Vitamins C, E, B12 and Zinc to promote wound healing  NUTRITION DIAGNOSIS:   Increased nutrient needs related to wound healing as evidenced by estimated needs.  GOAL:   Patient will meet greater than or equal to 90% of their needs  MONITOR:   TF tolerance, Weight trends, Labs, Skin  REASON FOR ASSESSMENT:   Consult Enteral/tube feeding initiation and management  ASSESSMENT:   64 yo female admitted from [facility] with MRSA bacteremia. PMH includes quadriplegia, chronic trach, vent dependence, PEG, anxiety/depression, PTSD, epidural abscess, DM-2, CKD stage 3, COPD, shellfish allergy.   Blood cultures positive for Klebsiella pneumoniae, and E. coli related to PICC line infection vs heel wound. PICC in right arm was removed 1/5. Midline has been placed in left arm.  SLP working with patient on PMSV use. Swallowing function to be assessed tomorrow.   Usual TF regimen is Isosource 1.5 at 55 ml/h via PEG. Comparable formula that is available on [facility] formulary is Jevity 1.5.   Patient is on chronic ventilator support via trach. MV: 5.5 L/min Temp (24hrs), Avg:97.6 F (36.4 C), Min:97.5 F (36.4 C), Max:97.7 F (36.5 C)   Labs reviewed.  CBG's: 80-87-88  Medications reviewed and include Fergon, IV meropenem, and IV vancomycin.   NUTRITION - FOCUSED PHYSICAL EXAM:  unable to complete  Diet Order:   Diet Order            Diet NPO time specified Except for: Sips with Meds  Diet effective midnight              EDUCATION NEEDS:   No education needs have been identified at  this time  Skin:  Skin Assessment: Skin Integrity Issues: Skin Integrity Issues:: Stage III Stage III: L heel  Last BM:  1/5  Height:   Ht Readings from Last 1 Encounters:  12/24/19 5\' 10"  (1.778 m)    Weight:   Wt Readings from Last 1 Encounters:  12/25/19 91.1 kg    Ideal Body Weight:  65.5 kg  BMI:  Body mass index is 28.82 kg/m.  Estimated Nutritional Needs:   Kcal:  1900-2150  Protein:  110-130 gm  Fluid:  >/= 2 L    02/22/20, RD, LDN, CNSC Pager [phone] After Hours Pager [phone]

## 2019-12-25 NOTE — Progress Notes (Signed)
[facility] [facility] for Infectious Disease  Date of Admission:  12/23/2019      Total days of antibiotics 2   Vancomycin day 3  Meropenem day 2  Fluconazole day 2    ASSESSMENT: Gabrielle Wade is a 64 y.o. 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 Records indicate she completed 6 weeks IV nafcillin through 10/14/19 with what sounds to have been chronic suppression with cephalexin 500 mg BID thereafter. Now knowing more information about her hospitalization in September for C-spine infection and with the understanding that the MRIs are unlikely to change her management given her severe irreversible injury, would feel comfortable deferring MRIs at this time.   Her PICC line has been removed and now has a new midline in her left arm. Repeat blood cultures were drawn this morning. She has been normothermic on current therapy of Meropenem + Vancomycin.  WBC remain < 1000; she declined bone marrow biopsy to work up her severe neutropenia (appears she was under the impression we were trying to do a autopsy on her). Her blood culture sensitivities are still pending but she does have an ESBL producing EColi in the urine - would presume this is also in blood and continue meropenem. Her foley catheter is getting ready to be exchanged today.   Original source at least for her MRSA likely PICC line infection vs heel wound - will check xray of left heel to look for osteomyelitis. Continue foam dressing and offloading as directed by wound care team. 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 Discussed with EP and the intention is for this device to epithelialize after placement; however given how recent this was placed impossible to know for sure if this has happened yet.  Would err on adding rifampin to her regimen at this time given MRSA bacteremia. TTE was unrevealing; uncertain if TEE would be helpful to rule in or out endocarditis with this device.   RUA dopplers are pending.     PLAN: 1. Continue Vancomycin for MRSAB 2. Continue Meropenem for ESBL EColi in Urine (likely same isolate in blood) 3. Will add rifampin VT with leadless pacemaker 4. Follow micro data 5. Follow up XRay of L heel    Principal Problem:   MRSA bacteremia Active Problems:   E coli bacteremia   Pressure ulcer of left heel, stage 3 (HCC)   Anemia   Neutropenia (HCC)   Quadriplegia (HCC)   Diabetes (HCC)   Edema of right upper arm   . sodium chloride   Intravenous Once  . chlorhexidine gluconate (MEDLINE KIT)  15 mL Mouth Rinse BID  . Chlorhexidine Gluconate Cloth  6 each Topical Daily  . [START ON 12/26/2019] ferrous gluconate  324 mg Oral Q breakfast  . fluconazole  200 mg Per Tube Daily  . insulin aspart  0-9 Units Subcutaneous Q4H  . mouth rinse  15 mL Mouth Rinse 10 times per day  . mupirocin ointment  1 application Nasal BID  . nystatin  5 mL Oral TID AC & HS  . pantoprazole sodium  40 mg Per Tube Q1200  . sodium chloride flush  10-40 mL Intracatheter Q12H  . traZODone  50 mg Oral QHS    SUBJECTIVE: "Feels that we think she is dead and  trying to do an autopsy on her." Feels very anxious. No new complaints.   Review of Systems: Review of Systems  Constitutional: Negative for chills, diaphoresis, fever and malaise/fatigue.  HENT: Sore throat: better.   Respiratory: Negative for cough and sputum production.   Cardiovascular: Negative for chest pain.  Gastrointestinal: Negative for abdominal pain and diarrhea.  Genitourinary:       Chronic indwelling foley     Allergies  Allergen Reactions  . Codeine   . Hydrocodone   . Influenza A (H1n1) Monovalent Vaccine   . Latex   . Morphine And Related   . Penicillins   . Shellfish Allergy   . Tape      OBJECTIVE: Vitals:   12/25/19 0823 12/25/19 0900 12/25/19 1122 12/25/19 1200  BP: (!) 120/49 (!) 122/50  (!) 123/49  Pulse: 65 66  66  Resp: _0 Temp:   (!) 97.5 F (36.4 C)   TempSrc:   Oral   SpO2: 100% 100%  100%  Weight:      Height:       Body mass index is 28.82 kg/m.  Physical Exam Constitutional:      Appearance: She is ill-appearing.  HENT:     Mouth/Throat:     Mouth: Mucous membranes are moist.     Pharynx: Oropharynx is clear.     Comments: White coating on tongue  Cardiovascular:     Rate and Rhythm: Normal rate and regular rhythm.     Heart sounds: No murmur.  Pulmonary:     Effort: Pulmonary effort is normal.     Breath sounds: Normal breath sounds.  Abdominal:     General: There is no distension.     Tenderness: There is no abdominal tenderness.  Musculoskeletal:     Comments: Quadriplegia R>L upper arm swelling. Cool edematous extremities.   Skin:    Capillary Refill: Capillary refill takes less than 2 seconds.     Findings: Bruising present.  Neurological:     Mental Status: She is alert.  Psychiatric:     Comments: Anxious      Lab Results Lab Results  Component Value Date   WBC 0.8 (LL) 12/25/2019   HGB 7.1 (L) 12/25/2019   HCT 23.5 (L) 12/25/2019   MCV 97.1 12/25/2019   PLT 207 12/25/2019    Lab Results  Component Value Date   CREATININE 1.39 (H) 12/25/2019   BUN 76 (H) 12/25/2019   NA 143 12/25/2019   K 4.4 12/25/2019   CL 107 12/25/2019   CO2 26 12/25/2019    Lab Results  Component Value Date   ALT 33 12/25/2019   AST 14 (L) 12/25/2019   ALKPHOS 175 (H) 12/25/2019   BILITOT 0.7 12/25/2019     Microbiology: Recent Results (from the past 240 hour(s))  Blood culture (routine x 2)     Status: Abnormal (Preliminary result)   Collection Time: 12/23/19  4:25 PM   Specimen: BLOOD RIGHT ARM  Result Value Ref Range Status   Specimen Description BLOOD RIGHT ARM  Final   Special Requests   Final    BOTTLES DRAWN  AEROBIC AND ANAEROBIC Blood Culture adequate volume   Culture  Setup Time   Final    GRAM POSITIVE COCCI IN BOTH AEROBIC AND ANAEROBIC BOTTLES CRITICAL VALUE NOTED.  VALUE IS CONSISTENT WITH PREVIOUSLY REPORTED AND CALLED VALUE. Performed at [facility], [place] [address] [place], [place] [address]    Culture  STAPHYLOCOCCUS AUREUS (A)  Final   Report Status PENDING  Incomplete  Respiratory Panel by RT PCR (Flu A&B, Covid) - Nasopharyngeal Swab     Status: None   Collection Time: 12/23/19  4:27 PM   Specimen: Nasopharyngeal Swab  Result Value Ref Range Status   SARS Coronavirus 2 by RT PCR NEGATIVE NEGATIVE Final    Comment: (NOTE) SARS-CoV-2 target nucleic acids are NOT DETECTED. The SARS-CoV-2 RNA is generally detectable in upper respiratoy specimens during the acute phase of infection. The lowest concentration of SARS-CoV-2 viral copies this assay can detect is 131 copies/mL. A negative result does not preclude SARS-Cov-2 infection and should not be used as the sole basis for treatment or other patient management decisions. A negative result may occur with  improper specimen collection/handling, submission of specimen other than nasopharyngeal swab, presence of viral mutation(s) within the areas targeted by this assay, and inadequate number of viral copies (<131 copies/mL). A negative result must be combined with clinical observations, patient history, and epidemiological information. The expected result is Negative. Fact Sheet for Patients:  PinkCheek.be Fact Sheet for Healthcare Providers:  GravelBags.it This test is not yet ap proved or cleared by the [place] FDA and  has been authorized for detection and/or diagnosis of SARS-CoV-2 by FDA under an Emergency Use Authorization (EUA). This EUA will remain  in effect (meaning this test can be used) for the duration of the COVID-19 declaration under Section  564(b)(1) of the Act, 21 U.S.C. section 360bbb-3(b)(1), unless the authorization is terminated or revoked sooner.    Influenza A by PCR NEGATIVE NEGATIVE Final   Influenza B by PCR NEGATIVE NEGATIVE Final    Comment: (NOTE) The Xpert Xpress SARS-CoV-2/FLU/RSV assay is intended as an aid in  the diagnosis of influenza from Nasopharyngeal swab specimens and  should not be used as a sole basis for treatment. Nasal washings and  aspirates are unacceptable for Xpert Xpress SARS-CoV-2/FLU/RSV  testing. Fact Sheet for Patients: PinkCheek.be Fact Sheet for Healthcare Providers: GravelBags.it This test is not yet approved or cleared by the [place] FDA and  has been authorized for detection and/or diagnosis of SARS-CoV-2 by  FDA under an Emergency Use Authorization (EUA). This EUA will remain  in effect (meaning this test can be used) for the duration of the  Covid-19 declaration under Section 564(b)(1) of the Act, 21  U.S.C. section 360bbb-3(b)(1), unless the authorization is  terminated or revoked. Performed at [facility], [place] [address] [place], [place] [address]   Urine culture     Status: Abnormal   Collection Time: 12/23/19  4:51 PM   Specimen: In/Out Cath Urine  Result Value Ref Range Status   Specimen Description IN/OUT CATH URINE  Final   Special Requests   Final    NONE Performed at [facility], [place] [address] [place], [place] [address]    Culture (A)  Final    >=100,000 COLONIES/mL KLEBSIELLA PNEUMONIAE >=100,000 COLONIES/mL ESCHERICHIA COLI Confirmed Extended Spectrum Beta-Lactamase Producer (ESBL).  In bloodstream infections from ESBL organisms, carbapenems are preferred over piperacillin/tazobactam. They are shown to have a lower risk of mortality.    Report Status 12/25/2019 FINAL  Final   Organism ID, Bacteria KLEBSIELLA PNEUMONIAE (A)  Final   Organism ID, Bacteria ESCHERICHIA COLI (A)   Final      Susceptibility   Escherichia coli - MIC*    AMPICILLIN >=32 RESISTANT Resistant     CEFAZOLIN >=64 RESISTANT Resistant  CEFTRIAXONE >=64 RESISTANT Resistant     CIPROFLOXACIN >=4 RESISTANT Resistant     GENTAMICIN <=1 SENSITIVE Sensitive     IMIPENEM <=0.25 SENSITIVE Sensitive     NITROFURANTOIN <=16 SENSITIVE Sensitive     TRIMETH/SULFA <=20 SENSITIVE Sensitive     AMPICILLIN/SULBACTAM >=32 RESISTANT Resistant     PIP/TAZO 32 INTERMEDIATE Intermediate     * >=100,000 COLONIES/mL ESCHERICHIA COLI   Klebsiella pneumoniae - MIC*    AMPICILLIN >=32 RESISTANT Resistant     CEFAZOLIN <=4 SENSITIVE Sensitive     CEFTRIAXONE <=0.25 SENSITIVE Sensitive     CIPROFLOXACIN <=0.25 SENSITIVE Sensitive     GENTAMICIN <=1 SENSITIVE Sensitive     IMIPENEM <=0.25 SENSITIVE Sensitive     NITROFURANTOIN 128 RESISTANT Resistant     TRIMETH/SULFA <=20 SENSITIVE Sensitive     AMPICILLIN/SULBACTAM 16 INTERMEDIATE Intermediate     PIP/TAZO <=4 SENSITIVE Sensitive     * >=100,000 COLONIES/mL KLEBSIELLA PNEUMONIAE  Blood culture (routine x 2)     Status: Abnormal (Preliminary result)   Collection Time: 12/23/19  4:58 PM   Specimen: BLOOD  Result Value Ref Range Status   Specimen Description BLOOD RIGHT THUMB  Final   Special Requests   Final    BOTTLES DRAWN AEROBIC AND ANAEROBIC Blood Culture adequate volume   Culture  Setup Time   Final    GRAM NEGATIVE RODS IN BOTH AEROBIC AND ANAEROBIC BOTTLES GRAM POSITIVE COCCI CRITICAL RESULT CALLED TO, READ BACK BY AND VERIFIED WITH: J. FRENS PHARMD, AT 0820 12/24/19 BY [facility] Performed at [facility], [place] [address], [place], [place] [address]    Culture ESCHERICHIA COLI STAPHYLOCOCCUS AUREUS  (A)  Final   Report Status PENDING  Incomplete  Blood Culture ID Panel (Reflexed)     Status: Abnormal   Collection Time: 12/23/19  4:58 PM  Result Value Ref Range Status   Enterococcus species NOT DETECTED NOT DETECTED Final   Listeria  monocytogenes NOT DETECTED NOT DETECTED Final   Staphylococcus species DETECTED (A) NOT DETECTED Final    Comment: CRITICAL RESULT CALLED TO, READ BACK BY AND VERIFIED WITH: J. FRENS PHARMD, AT 3785 12/24/19 BY D. VANHOOK    Staphylococcus aureus (BCID) DETECTED (A) NOT DETECTED Final    Comment: Methicillin (oxacillin)-resistant Staphylococcus aureus (MRSA). MRSA is predictably resistant to beta-lactam antibiotics (except ceftaroline). Preferred therapy is vancomycin unless clinically contraindicated. Patient requires contact precautions if  hospitalized. CRITICAL RESULT CALLED TO, READ BACK BY AND VERIFIED WITH: J. FRENS PHARMD, AT 8850 12/24/19 BY D. VANHOOK    Methicillin resistance DETECTED (A) NOT DETECTED Final    Comment: CRITICAL RESULT CALLED TO, READ BACK BY AND VERIFIED WITH: J. FRENS PHARMD, AT 2774 12/24/19 BY D. VANHOOK    Streptococcus species NOT DETECTED NOT DETECTED Final   Streptococcus agalactiae NOT DETECTED NOT DETECTED Final   Streptococcus pneumoniae NOT DETECTED NOT DETECTED Final   Streptococcus pyogenes NOT DETECTED NOT DETECTED Final   Acinetobacter baumannii NOT DETECTED NOT DETECTED Final   Enterobacteriaceae species DETECTED (A) NOT DETECTED Final    Comment: Enterobacteriaceae represent a large family of gram-negative bacteria, not a single organism. CRITICAL RESULT CALLED TO, READ BACK BY AND VERIFIED WITH: J. FRENS PHARMD, AT 1287 12/24/19 BY D. VANHOOK    Enterobacter cloacae complex NOT DETECTED NOT DETECTED Final   Escherichia coli DETECTED (A) NOT DETECTED Final    Comment: CRITICAL RESULT CALLED TO, READ BACK BY AND VERIFIED WITH: J. FRENS PHARMD, AT 8676 12/24/19  BY D. VANHOOK    Klebsiella oxytoca NOT DETECTED NOT DETECTED Final   Klebsiella pneumoniae NOT DETECTED NOT DETECTED Final   Proteus species NOT DETECTED NOT DETECTED Final   Serratia marcescens NOT DETECTED NOT DETECTED Final   Carbapenem resistance NOT DETECTED NOT DETECTED Final    Haemophilus influenzae NOT DETECTED NOT DETECTED Final   Neisseria meningitidis NOT DETECTED NOT DETECTED Final   Pseudomonas aeruginosa NOT DETECTED NOT DETECTED Final   Candida albicans NOT DETECTED NOT DETECTED Final   Candida glabrata NOT DETECTED NOT DETECTED Final   Candida krusei NOT DETECTED NOT DETECTED Final   Candida parapsilosis NOT DETECTED NOT DETECTED Final   Candida tropicalis NOT DETECTED NOT DETECTED Final    Comment: Performed at [facility], [place] [address], [place], [place] [address]  MRSA PCR Screening     Status: Abnormal   Collection Time: 12/24/19  5:50 PM   Specimen: Nasal Mucosa; Nasopharyngeal  Result Value Ref Range Status   MRSA by PCR POSITIVE (A) NEGATIVE Final    Comment:        The GeneXpert MRSA Assay (FDA approved for NASAL specimens only), is one component of a comprehensive MRSA colonization surveillance program. It is not intended to diagnose MRSA infection nor to guide or monitor treatment for MRSA infections. RESULT CALLED TO, READ BACK BY AND VERIFIED WITH: Everlean Alstrom RN 2017 12/24/19 A BROWNING Performed at [facility], [place] [address], [place], [place] [address]      Janene Madeira, MSN, NP-C [place] for Infectious Disease [email or web address] Pager: [phone] Office: [phone] [place]: [phone]

## 2019-12-25 NOTE — Progress Notes (Signed)
NAME:  Gabrielle Wade, MRN:  [id number], DOB:  1956/11/14, LOS: 2 ADMISSION DATE:  12/23/2019, CONSULTATION DATE:  12/22/2018 REFERRING MD:  Dr. [name], CHIEF COMPLAINT:  Chronic vent management  Brief History   63 yoF with prior medical history of cervical epidural abscess- MRSA in 08/2019 with resultant quadriplegia with tracheostomy and ventilator dependent respiratory failure sent from [facility] for evaluation of leukopenia and anemia.  PCCM consulted for chronic trach and vent management.   History of present illness   HPI obtained from medical chart review as patient is trach/ mechanical ventilator dependent.  No bedside medical records found, therefore limited HPI.   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  In ER, hemodynamically stable and initial temp 95.5.  Patient is awake in no distress on ventilator with settings PRVC, rate 12, FiO2 30%, TV 480, PEEP 5.  Unknown if patient weans.  Workup notable for WBC 0.8, Hgb 6.2, platelets 255, glucose 177, BUN 69, sCr 1.24, alk phos 247, albumin 1.2, corrected calcium 10.8, trop hs 5, lactic acid 1, normal coags, TSH 24.57, free T4 0.66, FOB negative, SARS2 PCR neg, UA negative.  CXR showing cardiomegaly with moderate bilateral pleural effusions, bilateral airspace and bibasilar opacities concerning for developing edema vs atypical infectious process vs atelectasis vs developing infiltrate.  TRH to admit, PCCM consulted for vent and tracheostomy management.   Past Medical History  cervical epidural abscess- MRSA in 08/2019 with resultant quadriplegia with tracheostomy and ventilator dependent respiratory failure, PEG, DMT2, CKD stage III, COPD, GERD, anxiety/ depression,  PTSD  Significant Hospital Events   1/4 Admit TRH  Consults:  PCCM - trach / vent   Procedures:  pta trach pta PEG   Significant Diagnostic Tests:   Micro Data:  1/4 SARS 2/ Flu A/B >> neg 1/4 UC >> 1/4 BCx 2 >> preliminary staph species methicillin-resistant  1/4 urine strep >> 1/4 urine legionella >>  Antimicrobials:  1/4 cefepime >> 1/4 vanc >>  Interim history/subjective:  No events overnight  Objective   Blood pressure (!) 122/50, pulse 66, temperature (!) 97.5 F (36.4 C), temperature source Oral, resp. rate 13, height _0  (1.778 m), weight 91.1 kg, SpO2 100 %.    Vent Mode: PRVC FiO2 (%):  [40 %] 40 % Set Rate:  [12 bmp] 12 bmp Vt Set:  [480 mL] 480 mL PEEP:  [5 cmH20] 5 cmH20 Plateau Pressure:  [14 cmH20-26 cmH20] 18 cmH20   Intake/Output Summary (Last 24 hours) at 12/25/2019 1051 Last data filed at 12/25/2019 0700 Gross per 24 hour  Intake 490 ml  Output 1450 ml  Net -960 ml    Examination: General: Chronically ill appearing female, NAD HEENT: [place]/AT, PERRL, EOM-I and MMM, trach in place Neuro: Alert and interactive, nods, quad CV: RRR, Nl S1/S2 and -M/R/G PULM: Coarse bilaterally Vent currently on pressure regulated volume control FIO2 40% PEEP 5 of PEEP RATE 12+6 VT 480 cc GI: Soft, NT, ND and +BS Extremities: warm/dry,  edema  Skin: no rashes or lesions  I reviewed CXR myself, trach is in a good position  Discussed with [facility] Problem list    Assessment & Plan:   Chronic respiratory failure with tracheostomy/ PEG secondary to prior cervical epidural abscess/ quadriplegia  Moderate bilateral  pleural effusions HCAP +/- pulmonary edema Pulmonary blood cultures with MRSA P:  Full vent support, no weaning Titrate O2 for sat of 88-92% Vent bundle wean for toleration F/U on culture Abx as ordered Continue to monitor pleural effusions Maintain current trach size and type  Best practice:  Diet: NPO, per PEG  currently not on tube feedings Pain/Anxiety/Delirium protocol (if indicated): not needed VAP protocol (if indicated): yes DVT prophylaxis: SCDs GI prophylaxis: PPI Glucose control: CBG q 4, SSI Mobility: PT/ OT Code Status: full  Family Communication: per primary, patient updated on plan of care Disposition: admit TRH/ PCCM for chronic vent managment  Labs   CBC: Recent Labs  Lab 12/23/19 1604 12/23/19 2138 12/24/19 0232 12/25/19 0129  WBC 0.8*  --  0.7* 0.8*  NEUTROABS 0.0*  --   --  0.0*  HGB 6.2* 7.8* 7.4* 7.1*  HCT 20.9* 23.0* 24.2* 23.5*  MCV 100.0  --  98.8 97.1  PLT 255  --  245 160    Basic Metabolic Panel: Recent Labs  Lab 12/23/19 1604 12/23/19 2138 12/24/19 0232 12/25/19 0129  NA 142 144 141 143  K 4.6 4.9 4.8 4.4  CL 107  --  106 107  CO2 29  --  28 26  GLUCOSE 177*  --  108* 103*  BUN 69*  --  70* 76*  CREATININE 1.24*  --  1.23* 1.39*  CALCIUM 8.6*  --  8.5* 8.3*   GFR: Estimated Creatinine Clearance: 49.4 mL/min (A) (by C-G formula based on SCr of 1.39 mg/dL (H)). Recent Labs  Lab 12/23/19 1604 12/23/19 1610 12/24/19 0232 12/25/19 0129  PROCALCITON  --   --  0.32 0.32  WBC 0.8*  --  0.7* 0.8*  LATICACIDVEN  --  1.0  --   --     Liver Function Tests: Recent Labs  Lab 12/23/19 1604 12/24/19 0232 12/25/19 0129  AST 16 17 14*  ALT 40 38 33  ALKPHOS 247* 206* 175*  BILITOT 0.6 0.7 0.7  PROT 6.8 6.9 6.6  ALBUMIN 1.2* 1.3* 1.3*   No results for input(s): LIPASE, AMYLASE in the last 168 hours. No results for input(s): AMMONIA in the last 168 hours.  ABG    Component Value Date/Time   PHART 7.455 (H) 12/23/2019 2138   PCO2ART 43.6 12/23/2019 2138   PO2ART 80.0 (L) 12/23/2019 2138   HCO3 30.7 (H) 12/23/2019 2138   TCO2 32 12/23/2019 2138   O2SAT 96.0 12/23/2019 2138     Coagulation Profile: Recent Labs  Lab 12/23/19 1623  INR 1.2    Cardiac Enzymes: No results for input(s): CKTOTAL, CKMB, CKMBINDEX, TROPONINI in the last  168 hours.  HbA1C: No results found for: HGBA1C  CBG: Recent Labs  Lab 12/24/19 1740 12/24/19 2027 12/25/19 0028 12/25/19 0451 12/25/19 0806  GLUCAP 92 100* 91 86 80   Rush Farmer, M.D. [facility] Pulmonary/Critical Care Medicine.

## 2019-12-25 NOTE — Progress Notes (Addendum)
PROGRESS NOTE  Gabrielle Wade YSA:[id number] DOB: Jul 25, 1956 DOA: 12/23/2019 PCP: Townsend Roger, MD  Brief History   Gabrielle Wade  is a 64 y.o. female, w anxiety/ depression,PTSD,  hx of epidural abscess (mrsa), w quadriplegia , Dm2, CKD stage3,  Copd, Chronic Trach/ vent dependence, Gerd, chronic dysphagia, Anemia, presents with ? Thrombocytopenia, Hgb 6.8, and wbc 0.6 per [facility].    Pt notes slight dyspnea and dry cough, but unable to tell me how long this has been going on . Pt denies fever ,chills, cp, palp, n/v, diarrhea, brbpr, black stool.    In the ED the patient was found to have Temp of 95.5 and SaO2 of 96% on 30% FIO2. CXR demonstrates scattered bilateral airspace opacities, atelectasis, and cardiomegaly with volume overload. WBC was 0.8, Hgb 6.2, and creatinine of 1.24 with Alk phos 247. FOBT was negative. Blood cultures x 2 were obtained. They have grown out MRSA from one bottle and GNR from the other. ID has been consulted as has hematology/oncology. PCCM has been consulted for management of the patient's trach and vent.  The patient is receiving IV Meropenem and Vancomycin. She has been diuresed with Lasix 20 mg IV x 1.  Echocardiogram has been performed. It has demonsdtrated an EF of 50-55% and Grade II diastolic dysfunction.  Consultants  PCCM Infectious disease Hematology/Oncology Interventional radiology  Procedures  None  Antibiotics   Anti-infectives (From admission, onward)    Start     Dose/Rate Route Frequency Ordered Stop   12/26/19 0600  vancomycin (VANCOCIN) IVPB 1000 mg/200 mL premix     1,000 mg 200 mL/hr over 60 Minutes Intravenous Every 48 hours 12/24/19 0512     12/24/19 2250  meropenem (MERREM) 1 g in sodium chloride 0.9 % 100 mL IVPB     1 g 200 mL/hr over 30 Minutes Intravenous Every 8 hours 12/24/19 1942     12/24/19 1815  fluconazole (DIFLUCAN) 40 MG/ML suspension 200 mg     200 mg Per Tube Daily 12/24/19 1804     12/24/19 1445   fluconazole (DIFLUCAN) 40 MG/ML suspension 200 mg  Status:  Discontinued     200 mg Per Tube Daily 12/24/19 1436 12/24/19 1804   12/24/19 1000  ceFEPIme (MAXIPIME) 2 g in sodium chloride 0.9 % 100 mL IVPB  Status:  Discontinued     2 g 200 mL/hr over 30 Minutes Intravenous Every 12 hours 12/24/19 0512 12/24/19 0859   12/24/19 1000  meropenem (MERREM) 1 g in sodium chloride 0.9 % 100 mL IVPB  Status:  Discontinued     1 g 200 mL/hr over 30 Minutes Intravenous Every 12 hours 12/24/19 0859 12/24/19 1942   12/24/19 0315  vancomycin (VANCOREADY) IVPB 1250 mg/250 mL     1,250 mg 166.7 mL/hr over 90 Minutes Intravenous  Once 12/24/19 0250 12/24/19 0819   12/23/19 2045  ceFEPIme (MAXIPIME) 2 g in sodium chloride 0.9 % 100 mL IVPB     2 g 200 mL/hr over 30 Minutes Intravenous  Once 12/23/19 2030 12/23/19 2201       Interval History/Subjective  The patient is resting comfortably. No new complaints.  Objective   Vitals:  Vitals:   12/25/19 1122 12/25/19 1200  BP:  (!) 123/49  Pulse:  66  Resp:  13  Temp: (!) 97.5 F (36.4 C)   SpO2:  100%    Exam:  Constitutional:  The patient is awake, alert, and oriented x 3. No acute distress. Respiratory:  Positive  for increased work of breathing. Diminished breath sounds bilateral Mild bilateral rales No wheezes or rhonchi are appreciated. No tactile fremitus Cardiovascular:  Regular rate and rhythm No murmurs, ectopy, or gallups. No lateral PMI. No thrills. Abdomen:  Abdomen is soft, non-tender, non-distended No hernias, masses, or organomegaly Normoactive bowel sounds.  Musculoskeletal:  No cyanosis, clubbing, or edema Skin:  No rashes, lesions, ulcers palpation of skin: no induration or nodules Neurologic:  CN 2-12 intact Pt is a quadriplegic Psychiatric:  Mental status Mood, affect appropriate Orientation to person, place, time  judgment and insight appear intact  I have personally reviewed the following:   Today's  Data  Vitals, BMP, CBC  Micro Data  Blood Cultures: 1 positive for Klebsiella pneumoniae, 1 positive for E. Coli, MRSA positive  Cardiology Data  Echo performed, results pending  Scheduled Meds:  sodium chloride   Intravenous Once   chlorhexidine gluconate (MEDLINE KIT)  15 mL Mouth Rinse BID   Chlorhexidine Gluconate Cloth  6 each Topical Daily   [START ON 12/26/2019] ferrous gluconate  324 mg Oral Q breakfast   fluconazole  200 mg Per Tube Daily   insulin aspart  0-9 Units Subcutaneous Q4H   mouth rinse  15 mL Mouth Rinse 10 times per day   mupirocin ointment  1 application Nasal BID   nystatin  5 mL Oral TID AC & HS   pantoprazole sodium  40 mg Per Tube Q1200   sodium chloride flush  10-40 mL Intracatheter Q12H   traZODone  50 mg Oral QHS   Continuous Infusions:  meropenem (MERREM) IV 1 g (12/25/19 0736)   [START ON 12/26/2019] vancomycin      Principal Problem:   MRSA bacteremia Active Problems:   Anemia   Neutropenia (HCC)   Quadriplegia (HCC)   Diabetes ([place])   E coli bacteremia   Pressure ulcer of left heel, stage 3 (HCC)   Edema of right upper arm   LOS: 2 days   A & P  Sepsis: Neutropenia, AKI, Hypothermia, Hypoxia, MRSA Bacteremia, GNR bacteremia, pulmonary edema due to sepsis.  Bacteremia: MRSA and Klebsiella pneumonia and E. Coli. Infectious disease consulted. Echocardiogram has been performed and demonstrates no evidence of endocarditis, . The patient is receiving IV Vancomycin and meropenem. 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 This resulted in her quadriplegia. She has been at [facility] for a month. Possible sources include, osteomyelitis of C-spine, PICC, foley catheter, heel wound. Left heel to be x-rayed to investigate osteomyelitis.TTE was unrevealing in terms of endocarditis. There is concern for the leadless pacemaker device that is seated in the ventricle wall. Rifampin has been  added to coverage due to leadless pacemaker.  Neutropenia/Anemia: Severe. Likely due to Sepsis from bacteremia. Hematology consulted. The patient has been transfused with 2 units PRBC's. The plan is for bone marrow biopsy. I appreciate hematology/oncology. SPEP, Folate RBC pending, ANA negative, ESR elevated at 125, reticulocyte and DAT pending.   Acute on chronic respiratory failure, tracheostomy and vent dependence: PCCM consultated. CXR demonstrated scattered bilateral opacities secondary to developing  acute pulmonary edema due to acute exacerbation of chronic diastolic dysfunction and sepsis. Atelectasis also present. She continues to have oxygen saturation with 40% O2 by mechanical ventilation. PASSY MUIR Valve.   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 PT/OT will be consulted to maintain range of motion.   Right upper extremity swelling: Doppler of the right upper extremity  is negative for DVT. Swelling is likely due to PICC in this extremity. It is expected that this will be removed.   AKI: Creatinine is 1.39. It is unknown what the baseline creatinine is, however, Given her reduced muscle mass from quadriplegia, I would be surprised if it were greater than 1.0.  Diabetes: HbA1c pending. The patient's glucoses have run 80-100 over the past few hours without any insulin coverage, although she has been NPO. Tube feeds are to be restarted. Will continue the patient's NPH once tube feeds have been restarted.   Hypoalbuminemia: Pt was receiving Isosource 1.5 cal at 55 cc/hr prior to admission. Dietary has been consulted for continuation of tube feeds per PEG.  Abnormal TSH: Await Ft4 and T3 to determine significance.  I have seen and examined this patient myself. I have spent 35 minutes in his evaluation and care.  Gabrielle Behl, DO [facility] Direct contact: see [email or web address]  7PM-7AM contact night coverage as above 12/24/2019, 1:39 PM  LOS: 1 day

## 2019-12-25 NOTE — Progress Notes (Signed)
Received call from pts dtr [name] Aloff. Pt gave permission for this RN to provide dtr w/ updates.  Dtr requesting e-link chat. Spoke w/ e-link to provide info.

## 2019-12-26 DIAGNOSIS — Z950 Presence of cardiac pacemaker: Secondary | ICD-10-CM | POA: Diagnosis present

## 2019-12-26 DIAGNOSIS — N390 Urinary tract infection, site not specified: Secondary | ICD-10-CM

## 2019-12-26 DIAGNOSIS — G061 Intraspinal abscess and granuloma: Secondary | ICD-10-CM

## 2019-12-26 LAB — BLOOD CULTURE ID PANEL (REFLEXED)
Acinetobacter baumannii: NOT DETECTED
Candida albicans: NOT DETECTED
Candida glabrata: NOT DETECTED
Candida krusei: NOT DETECTED
Candida parapsilosis: DETECTED — AB
Candida tropicalis: NOT DETECTED
Enterobacter cloacae complex: NOT DETECTED
Enterobacteriaceae species: NOT DETECTED
Enterococcus species: NOT DETECTED
Escherichia coli: NOT DETECTED
Haemophilus influenzae: NOT DETECTED
Klebsiella oxytoca: NOT DETECTED
Klebsiella pneumoniae: NOT DETECTED
Listeria monocytogenes: NOT DETECTED
Neisseria meningitidis: NOT DETECTED
Proteus species: NOT DETECTED
Pseudomonas aeruginosa: NOT DETECTED
Serratia marcescens: NOT DETECTED
Staphylococcus aureus (BCID): NOT DETECTED
Staphylococcus species: NOT DETECTED
Streptococcus agalactiae: NOT DETECTED
Streptococcus pneumoniae: NOT DETECTED
Streptococcus pyogenes: NOT DETECTED
Streptococcus species: NOT DETECTED

## 2019-12-26 LAB — CBC WITH DIFFERENTIAL/PLATELET
Abs Immature Granulocytes: 0 10*3/uL (ref 0.00–0.07)
Basophils Absolute: 0 10*3/uL (ref 0.0–0.1)
Basophils Relative: 1 %
Eosinophils Absolute: 0.2 10*3/uL (ref 0.0–0.5)
Eosinophils Relative: 25 %
HCT: 23.5 % — ABNORMAL LOW (ref 36.0–46.0)
Hemoglobin: 7 g/dL — ABNORMAL LOW (ref 12.0–15.0)
Immature Granulocytes: 0 %
Lymphocytes Relative: 60 %
Lymphs Abs: 0.6 10*3/uL — ABNORMAL LOW (ref 0.7–4.0)
MCH: 29 pg (ref 26.0–34.0)
MCHC: 29.8 g/dL — ABNORMAL LOW (ref 30.0–36.0)
MCV: 97.5 fL (ref 80.0–100.0)
Monocytes Absolute: 0.1 10*3/uL (ref 0.1–1.0)
Monocytes Relative: 13 %
Neutro Abs: 0 10*3/uL — ABNORMAL LOW (ref 1.7–7.7)
Neutrophils Relative %: 1 %
Platelets: 209 10*3/uL (ref 150–400)
RBC: 2.41 MIL/uL — ABNORMAL LOW (ref 3.87–5.11)
RDW: 18.5 % — ABNORMAL HIGH (ref 11.5–15.5)
WBC: 0.9 10*3/uL — CL (ref 4.0–10.5)
nRBC: 0 % (ref 0.0–0.2)

## 2019-12-26 LAB — BASIC METABOLIC PANEL
Anion gap: 4 — ABNORMAL LOW (ref 5–15)
BUN: 82 mg/dL — ABNORMAL HIGH (ref 8–23)
CO2: 32 mmol/L (ref 22–32)
Calcium: 8.3 mg/dL — ABNORMAL LOW (ref 8.9–10.3)
Chloride: 109 mmol/L (ref 98–111)
Creatinine, Ser: 1.38 mg/dL — ABNORMAL HIGH (ref 0.44–1.00)
GFR calc Af Amer: 46 mL/min — ABNORMAL LOW (ref >60)
GFR calc non Af Amer: 40 mL/min — ABNORMAL LOW (ref >60)
Glucose, Bld: 169 mg/dL — ABNORMAL HIGH (ref 70–99)
Potassium: 4 mmol/L (ref 3.5–5.1)
Sodium: 145 mmol/L (ref 135–145)

## 2019-12-26 LAB — GLUCOSE, CAPILLARY
Glucose-Capillary: 125 mg/dL — ABNORMAL HIGH (ref 70–99)
Glucose-Capillary: 148 mg/dL — ABNORMAL HIGH (ref 70–99)
Glucose-Capillary: 174 mg/dL — ABNORMAL HIGH (ref 70–99)
Glucose-Capillary: 180 mg/dL — ABNORMAL HIGH (ref 70–99)
Glucose-Capillary: 182 mg/dL — ABNORMAL HIGH (ref 70–99)
Glucose-Capillary: 185 mg/dL — ABNORMAL HIGH (ref 70–99)
Glucose-Capillary: 195 mg/dL — ABNORMAL HIGH (ref 70–99)

## 2019-12-26 LAB — T3, FREE: T3, Free: 1.1 pg/mL — ABNORMAL LOW (ref 2.0–4.4)

## 2019-12-26 LAB — CULTURE, BLOOD (ROUTINE X 2)
Special Requests: ADEQUATE
Special Requests: ADEQUATE

## 2019-12-26 MED ORDER — FERROUS SULFATE 300 (60 FE) MG/5ML PO SYRP
300.0000 mg | ORAL_SOLUTION | Freq: Every day | ORAL | Status: DC
  Administered 2019-12-26 – 2020-01-07 (×13): 300 mg
  Filled 2019-12-26 (×14): qty 5

## 2019-12-26 MED ORDER — FLUCONAZOLE 200 MG PO TABS
200.0000 mg | ORAL_TABLET | Freq: Every day | ORAL | Status: DC
  Administered 2019-12-26: 200 mg
  Filled 2019-12-26: qty 1

## 2019-12-26 MED ORDER — VANCOMYCIN HCL IN DEXTROSE 1-5 GM/200ML-% IV SOLN
1000.0000 mg | INTRAVENOUS | Status: DC
  Administered 2019-12-27 – 2019-12-30 (×4): 1000 mg via INTRAVENOUS
  Filled 2019-12-26 (×4): qty 200

## 2019-12-26 MED ORDER — SODIUM CHLORIDE 0.9 % IV SOLN
1.0000 g | Freq: Two times a day (BID) | INTRAVENOUS | Status: AC
  Administered 2019-12-26 – 2019-12-30 (×9): 1 g via INTRAVENOUS
  Filled 2019-12-26 (×9): qty 1

## 2019-12-26 NOTE — Progress Notes (Signed)
Gabrielle Wade for Infectious Disease  Date of Admission:  12/23/2019      Total days of antibiotics 3   Vancomycin day 4  Meropenem day 3  Fluconazole day 3   ASSESSMENT: Gabrielle Wade is a 63 y.o. 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 Records indicate she completed 6 weeks IV nafcillin through 10/14/19 with what sounds to have been chronic suppression with cephalexin 500 mg BID thereafter.   Her PICC line has been removed and now has a new midline in her left arm that was placed prior to clearing bacteremia. Repeat blood cultures drawn negative 24h. If these remain negative tomorrow would plan to remove midline tomorrow (considered to be contaminated) then place new PICC line to complete therapy.   Now confirmed ESBL producing EColi in the blood secondary to urinary tract infection - would treat with 7 days total meropenem then stop on January 12th.    I called to discuss with her husband - decided that adding TEE would not change much and will go forth with 6 weeks of IV vancomycin for complicated bacteremia + rifampin 600 mg once daily. She will at the end need to resume her suppressive cephalexin for contaminated hardware in neck from previous surgery 500 mg BID thereafter.  We also discussed that he, Gabrielle Wade and her medical team have on two occasions had goals of care conversations and from what he reports she is still hopeful that she can get better enough to come home. He knows this is unlikely to happen but wants to continue to give her the best chance at this.     PLAN: 1. Continue Vancomycin + p.o. Rifampin for MRSA infection 2. Continue Meropenem for ESBL EColi bacteremia/UTI   Principal Problem:   MRSA bacteremia Active Problems:   E coli bacteremia   Pressure ulcer of left heel, stage 3 (HCC)   Lead-less pacemaker   Anemia   Neutropenia (HCC)    Quadriplegia (HCC)   Diabetes (HCC)   Edema of right upper arm   . sodium chloride   Intravenous Once  . chlorhexidine gluconate (MEDLINE KIT)  15 mL Mouth Rinse BID  . Chlorhexidine Gluconate Cloth  6 each Topical Daily  . feeding supplement (PRO-STAT SUGAR FREE 64)  30 mL Per Tube TID  . ferrous sulfate  300 mg Per Tube Q breakfast  . fluconazole  200 mg Per Tube Daily  . insulin aspart  0-9 Units Subcutaneous Q4H  . mouth rinse  15 mL Mouth Rinse 10 times per day  . mupirocin ointment  1 application Nasal BID  . nutrition supplement (JUVEN)  1 packet Per Tube BID  . nystatin  5 mL Oral TID AC & HS  . pantoprazole sodium  40 mg Per Tube Q1200  . rifampin  600 mg Per Tube q1800  . sodium chloride flush  10-40 mL Intracatheter Q12H    SUBJECTIVE: Sleepy today with anxiety meds back on board - she does indicate she feels better but having some mild nausea in the AM.   Review of Systems: Review of Systems  Constitutional: Negative for chills, diaphoresis, fever and malaise/fatigue.  Respiratory: Negative for cough and sputum production.   Cardiovascular: Negative for chest pain.  Gastrointestinal: Negative for abdominal pain and diarrhea.  Genitourinary:       Chronic indwelling foley   Musculoskeletal:       Quadriplegia  Allergies  Allergen Reactions  . Codeine   . Hydrocodone   . Influenza A (H1n1) Monovalent Vaccine   . Latex   . Morphine And Related   . Penicillins   . Shellfish Allergy   . Tape     OBJECTIVE: Vitals:   12/26/19 0600 12/26/19 0724 12/26/19 0800 12/26/19 0847  BP: (!) 114/45  (!) 119/48 (!) 119/48  Pulse: 64  66   Resp: 14  14   Temp:  97.8 F (36.6 C)    TempSrc:  Axillary    SpO2: 100%  100%   Weight:      Height:       Body mass index is 28.15 kg/m.  Physical Exam Constitutional:      Appearance: She is ill-appearing.  HENT:     Mouth/Throat:     Mouth: Mucous membranes are moist.     Pharynx: Oropharynx is clear.      Comments: Clear, only slightly coated with white patch Cardiovascular:     Rate and Rhythm: Normal rate and regular rhythm.     Heart sounds: No murmur.  Pulmonary:     Effort: Pulmonary effort is normal.     Breath sounds: Normal breath sounds.  Abdominal:     General: There is no distension.     Tenderness: There is no abdominal tenderness.  Musculoskeletal:     Comments: Quadriplegia R>L upper arm swelling. Cool edematous extremities.   Skin:    Capillary Refill: Capillary refill takes less than 2 seconds.     Findings: Bruising present.  Neurological:     Mental Status: She is alert.  Psychiatric:     Comments: Anxious      Lab Results Lab Results  Component Value Date   WBC 0.9 (LL) 12/26/2019   HGB 7.0 (L) 12/26/2019   HCT 23.5 (L) 12/26/2019   MCV 97.5 12/26/2019   PLT 209 12/26/2019    Lab Results  Component Value Date   CREATININE 1.38 (H) 12/26/2019   BUN 82 (H) 12/26/2019   NA 145 12/26/2019   K 4.0 12/26/2019   CL 109 12/26/2019   CO2 32 12/26/2019    Lab Results  Component Value Date   ALT 33 12/25/2019   AST 14 (L) 12/25/2019   ALKPHOS 175 (H) 12/25/2019   BILITOT 0.7 12/25/2019     Microbiology: Recent Results (from the past 240 hour(s))  Blood culture (routine x 2)     Status: Abnormal   Collection Time: 12/23/19  4:25 PM   Specimen: BLOOD RIGHT ARM  Result Value Ref Range Status   Specimen Description BLOOD RIGHT ARM  Final   Special Requests   Final    BOTTLES DRAWN AEROBIC AND ANAEROBIC Blood Culture adequate volume   Culture  Setup Time   Final    GRAM POSITIVE COCCI IN BOTH AEROBIC AND ANAEROBIC BOTTLES CRITICAL VALUE NOTED.  VALUE IS CONSISTENT WITH PREVIOUSLY REPORTED AND CALLED VALUE.    Culture (A)  Final    STAPHYLOCOCCUS AUREUS SUSCEPTIBILITIES PERFORMED ON PREVIOUS CULTURE WITHIN THE LAST 5 DAYS. Performed at [facility], [place] [address], [place], [place] [address]    Report Status 12/26/2019 FINAL  Final   Respiratory Panel by RT PCR (Flu A&B, Covid) - Nasopharyngeal Swab     Status: None   Collection Time: 12/23/19  4:27 PM   Specimen: Nasopharyngeal Swab  Result Value Ref Range Status   SARS Coronavirus 2 by RT PCR NEGATIVE NEGATIVE Final  Comment: (NOTE) SARS-CoV-2 target nucleic acids are NOT DETECTED. The SARS-CoV-2 RNA is generally detectable in upper respiratoy specimens during the acute phase of infection. The lowest concentration of SARS-CoV-2 viral copies this assay can detect is 131 copies/mL. A negative result does not preclude SARS-Cov-2 infection and should not be used as the sole basis for treatment or other patient management decisions. A negative result may occur with  improper specimen collection/handling, submission of specimen other than nasopharyngeal swab, presence of viral mutation(s) within the areas targeted by this assay, and inadequate number of viral copies (<131 copies/mL). A negative result must be combined with clinical observations, patient history, and epidemiological information. The expected result is Negative. Fact Sheet for Patients:  PinkCheek.be Fact Sheet for Healthcare Providers:  GravelBags.it This test is not yet ap proved or cleared by the [place] FDA and  has been authorized for detection and/or diagnosis of SARS-CoV-2 by FDA under an Emergency Use Authorization (EUA). This EUA will remain  in effect (meaning this test can be used) for the duration of the COVID-19 declaration under Section 564(b)(1) of the Act, 21 U.S.C. section 360bbb-3(b)(1), unless the authorization is terminated or revoked sooner.    Influenza A by PCR NEGATIVE NEGATIVE Final   Influenza B by PCR NEGATIVE NEGATIVE Final    Comment: (NOTE) The Xpert Xpress SARS-CoV-2/FLU/RSV assay is intended as an aid in  the diagnosis of influenza from Nasopharyngeal swab specimens and  should not be used as a sole  basis for treatment. Nasal washings and  aspirates are unacceptable for Xpert Xpress SARS-CoV-2/FLU/RSV  testing. Fact Sheet for Patients: PinkCheek.be Fact Sheet for Healthcare Providers: GravelBags.it This test is not yet approved or cleared by the [place] FDA and  has been authorized for detection and/or diagnosis of SARS-CoV-2 by  FDA under an Emergency Use Authorization (EUA). This EUA will remain  in effect (meaning this test can be used) for the duration of the  Covid-19 declaration under Section 564(b)(1) of the Act, 21  U.S.C. section 360bbb-3(b)(1), unless the authorization is  terminated or revoked. Performed at [facility], [place] [address], [place], [place] [address]   Urine culture     Status: Abnormal   Collection Time: 12/23/19  4:51 PM   Specimen: In/Out Cath Urine  Result Value Ref Range Status   Specimen Description IN/OUT CATH URINE  Final   Special Requests   Final    NONE Performed at [facility], [place] [address] [place], [place] [address]    Culture (A)  Final    >=100,000 COLONIES/mL KLEBSIELLA PNEUMONIAE >=100,000 COLONIES/mL ESCHERICHIA COLI Confirmed Extended Spectrum Beta-Lactamase Producer (ESBL).  In bloodstream infections from ESBL organisms, carbapenems are preferred over piperacillin/tazobactam. They are shown to have a lower risk of mortality.    Report Status 12/25/2019 FINAL  Final   Organism ID, Bacteria KLEBSIELLA PNEUMONIAE (A)  Final   Organism ID, Bacteria ESCHERICHIA COLI (A)  Final      Susceptibility   Escherichia coli - MIC*    AMPICILLIN >=32 RESISTANT Resistant     CEFAZOLIN >=64 RESISTANT Resistant     CEFTRIAXONE >=64 RESISTANT Resistant     CIPROFLOXACIN >=4 RESISTANT Resistant     GENTAMICIN <=1 SENSITIVE Sensitive     IMIPENEM <=0.25 SENSITIVE Sensitive     NITROFURANTOIN <=16 SENSITIVE Sensitive     TRIMETH/SULFA <=20 SENSITIVE Sensitive      AMPICILLIN/SULBACTAM >=32 RESISTANT Resistant     PIP/TAZO 32 INTERMEDIATE Intermediate     * >=  100,000 COLONIES/mL ESCHERICHIA COLI   Klebsiella pneumoniae - MIC*    AMPICILLIN >=32 RESISTANT Resistant     CEFAZOLIN <=4 SENSITIVE Sensitive     CEFTRIAXONE <=0.25 SENSITIVE Sensitive     CIPROFLOXACIN <=0.25 SENSITIVE Sensitive     GENTAMICIN <=1 SENSITIVE Sensitive     IMIPENEM <=0.25 SENSITIVE Sensitive     NITROFURANTOIN 128 RESISTANT Resistant     TRIMETH/SULFA <=20 SENSITIVE Sensitive     AMPICILLIN/SULBACTAM 16 INTERMEDIATE Intermediate     PIP/TAZO <=4 SENSITIVE Sensitive     * >=100,000 COLONIES/mL KLEBSIELLA PNEUMONIAE  Blood culture (routine x 2)     Status: Abnormal   Collection Time: 12/23/19  4:58 PM   Specimen: BLOOD  Result Value Ref Range Status   Specimen Description BLOOD RIGHT THUMB  Final   Special Requests   Final    BOTTLES DRAWN AEROBIC AND ANAEROBIC Blood Culture adequate volume   Culture  Setup Time   Final    GRAM NEGATIVE RODS IN BOTH AEROBIC AND ANAEROBIC BOTTLES GRAM POSITIVE COCCI CRITICAL RESULT CALLED TO, READ BACK BY AND VERIFIED WITH: J. FRENS PHARMD, AT 0820 12/24/19 BY D.[facility] Performed at [facility], [place] [address] [place], [place] [address]    Culture (A)  Final    ESCHERICHIA COLI STAPHYLOCOCCUS AUREUS Confirmed Extended Spectrum Beta-Lactamase Producer (ESBL).  In bloodstream infections from ESBL organisms, carbapenems are preferred over piperacillin/tazobactam. They are shown to have a lower risk of mortality.    Report Status 12/26/2019 FINAL  Final   Organism ID, Bacteria ESCHERICHIA COLI  Final   Organism ID, Bacteria STAPHYLOCOCCUS AUREUS  Final      Susceptibility   Escherichia coli - MIC*    AMPICILLIN >=32 RESISTANT Resistant     CEFAZOLIN >=64 RESISTANT Resistant     CEFEPIME 16 RESISTANT Resistant     CEFTAZIDIME RESISTANT Resistant     CEFTRIAXONE >=64 RESISTANT Resistant     CIPROFLOXACIN >=4 RESISTANT Resistant      GENTAMICIN <=1 SENSITIVE Sensitive     IMIPENEM <=0.25 SENSITIVE Sensitive     TRIMETH/SULFA <=20 SENSITIVE Sensitive     AMPICILLIN/SULBACTAM 16 INTERMEDIATE Intermediate     PIP/TAZO <=4 SENSITIVE Sensitive     * ESCHERICHIA COLI   Staphylococcus aureus - MIC*    CIPROFLOXACIN >=8 RESISTANT Resistant     ERYTHROMYCIN >=8 RESISTANT Resistant     GENTAMICIN <=0.5 SENSITIVE Sensitive     OXACILLIN >=4 RESISTANT Resistant     TETRACYCLINE >=16 RESISTANT Resistant     VANCOMYCIN <=0.5 SENSITIVE Sensitive     TRIMETH/SULFA <=10 SENSITIVE Sensitive     CLINDAMYCIN <=0.25 SENSITIVE Sensitive     RIFAMPIN <=0.5 SENSITIVE Sensitive     Inducible Clindamycin NEGATIVE Sensitive     * STAPHYLOCOCCUS AUREUS  Blood Culture ID Panel (Reflexed)     Status: Abnormal   Collection Time: 12/23/19  4:58 PM  Result Value Ref Range Status   Enterococcus species NOT DETECTED NOT DETECTED Final   Listeria monocytogenes NOT DETECTED NOT DETECTED Final   Staphylococcus species DETECTED (A) NOT DETECTED Final    Comment: CRITICAL RESULT CALLED TO, READ BACK BY AND VERIFIED WITH: J. FRENS PHARMD, AT 7408 12/24/19 BY D. VANHOOK    Staphylococcus aureus (BCID) DETECTED (A) NOT DETECTED Final    Comment: Methicillin (oxacillin)-resistant Staphylococcus aureus (MRSA). MRSA is predictably resistant to beta-lactam antibiotics (except ceftaroline). Preferred therapy is vancomycin unless clinically contraindicated. Patient requires contact precautions if  hospitalized. CRITICAL RESULT  CALLED TO, READ BACK BY AND VERIFIED WITH: J. FRENS PHARMD, AT 4619 12/24/19 BY D. VANHOOK    Methicillin resistance DETECTED (A) NOT DETECTED Final    Comment: CRITICAL RESULT CALLED TO, READ BACK BY AND VERIFIED WITH: J. FRENS PHARMD, AT 0122 12/24/19 BY D. VANHOOK    Streptococcus species NOT DETECTED NOT DETECTED Final   Streptococcus agalactiae NOT DETECTED NOT DETECTED Final   Streptococcus pneumoniae NOT DETECTED NOT DETECTED  Final   Streptococcus pyogenes NOT DETECTED NOT DETECTED Final   Acinetobacter baumannii NOT DETECTED NOT DETECTED Final   Enterobacteriaceae species DETECTED (A) NOT DETECTED Final    Comment: Enterobacteriaceae represent a large family of gram-negative bacteria, not a single organism. CRITICAL RESULT CALLED TO, READ BACK BY AND VERIFIED WITH: J. FRENS PHARMD, AT 2411 12/24/19 BY D. VANHOOK    Enterobacter cloacae complex NOT DETECTED NOT DETECTED Final   Escherichia coli DETECTED (A) NOT DETECTED Final    Comment: CRITICAL RESULT CALLED TO, READ BACK BY AND VERIFIED WITH: J. FRENS PHARMD, AT 4643 12/24/19 BY D. VANHOOK    Klebsiella oxytoca NOT DETECTED NOT DETECTED Final   Klebsiella pneumoniae NOT DETECTED NOT DETECTED Final   Proteus species NOT DETECTED NOT DETECTED Final   Serratia marcescens NOT DETECTED NOT DETECTED Final   Carbapenem resistance NOT DETECTED NOT DETECTED Final   Haemophilus influenzae NOT DETECTED NOT DETECTED Final   Neisseria meningitidis NOT DETECTED NOT DETECTED Final   Pseudomonas aeruginosa NOT DETECTED NOT DETECTED Final   Candida albicans NOT DETECTED NOT DETECTED Final   Candida glabrata NOT DETECTED NOT DETECTED Final   Candida krusei NOT DETECTED NOT DETECTED Final   Candida parapsilosis NOT DETECTED NOT DETECTED Final   Candida tropicalis NOT DETECTED NOT DETECTED Final    Comment: Performed at [facility], [place] [address], [place], [place] [address]  MRSA PCR Screening     Status: Abnormal   Collection Time: 12/24/19  5:50 PM   Specimen: Nasal Mucosa; Nasopharyngeal  Result Value Ref Range Status   MRSA by PCR POSITIVE (A) NEGATIVE Final    Comment:        The GeneXpert MRSA Assay (FDA approved for NASAL specimens only), is one component of a comprehensive MRSA colonization surveillance program. It is not intended to diagnose MRSA infection nor to guide or monitor treatment for MRSA infections. RESULT CALLED TO, READ BACK BY AND  VERIFIED WITH: Everlean Alstrom RN 2017 12/24/19 A BROWNING Performed at [facility], [place] [address], [place], [place] [address]      Janene Madeira, MSN, NP-C [facility] [email or web address] Pager: [phone] Office: [phone] [place]: [phone]

## 2019-12-26 NOTE — Progress Notes (Addendum)
NAME:  Gabrielle Wade, MRN:  [id number], DOB:  [birth year], LOS: 3 ADMISSION DATE:  12/23/2019, CONSULTATION DATE:  12/22/2018 REFERRING MD:  Dr. [name], CHIEF COMPLAINT:  Chronic vent management  Brief History   79 yoF with prior medical history of cervical epidural abscess- MRSA in 08/2019 with resultant quadriplegia with tracheostomy and ventilator dependent respiratory failure sent from [facility] for evaluation of leukopenia and anemia.  PCCM consulted for chronic trach and vent management.   History of present illness   HPI obtained from medical chart review as patient is trach/ mechanical ventilator dependent.  No bedside medical records found, therefore limited HPI.   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  In ER, hemodynamically stable and initial temp 95.5.  Patient is awake in no distress on ventilator with settings PRVC, rate 12, FiO2 30%, TV 480, PEEP 5.  Unknown if patient weans.  Workup notable for WBC 0.8, Hgb 6.2, platelets 255, glucose 177, BUN 69, sCr 1.24, alk phos 247, albumin 1.2, corrected calcium 10.8, trop hs 5, lactic acid 1, normal coags, TSH 24.57, free T4 0.66, FOB negative, SARS2 PCR neg, UA negative.  CXR showing cardiomegaly with moderate bilateral pleural effusions, bilateral airspace and bibasilar opacities concerning for developing edema vs atypical infectious process vs atelectasis vs developing infiltrate.  TRH to admit, PCCM consulted for vent and tracheostomy management.   Past Medical History  Cervical epidural abscess- MRSA in 08/2019 with resultant quadriplegia with tracheostomy and ventilator dependent respiratory failure, PEG, DMT2, CKD stage III, COPD, GERD, anxiety/ depression,  PTSD  Significant Hospital Events   1/4 Admit TRH  Consults:  PCCM - trach / vent   Procedures:  pta trach pta PEG   Significant Diagnostic Tests:   Micro Data:  1/4 SARS 2/ Flu A/B >> neg 1/4 UC >> >100K cfus of E coli and klebsiella pneumonia 1/4 BCx 2 >> preliminary staph species methicillin-resistant  1/4 urine strep >> (-) 1/4 urine legionella >> (-)  Antimicrobials:  1/4 cefepime >> 1/6 1/4 vanc >> 1/6 meropenem >>   Interim history/subjective:  No events overnight. Patient remains on vent/trach today.   Objective   Blood pressure (!) 114/45, pulse 64, temperature 97.8 F (36.6 C), temperature source Axillary, resp. rate 14, height _0  (1.778 m), weight 89 kg, SpO2 100 %.    Vent Mode: PRVC FiO2 (%):  [40 %] 40 % Set Rate:  [12 bmp] 12 bmp Vt Set:  [480 mL] 480 mL PEEP:  [5 cmH20] 5 cmH20 Plateau Pressure:  [18 cmH20-20 cmH20] 19 cmH20   Intake/Output Summary (Last 24 hours) at 12/26/2019 0803 Last data filed at 12/26/2019 5597 Gross per 24 hour  Intake 931.18 ml  Output 640 ml  Net 291.18 ml    Examination: General: Intubated, afebrile, nondiaphoretic HEENT: PERRL Cardio: RRR, no mrg's  Pulmonary: Mild crackles bilaterally Abdomen: Bowel sounds normal, nontender  MSK: BLE nontender, edematous upper and lower extremities  Neuro: Does not respond to commands, eyes open, looks around the room intermittently  Skin: Erythematous forearms,   Resolved Hospital Problem list     Assessment & Plan:   Chronic respiratory failure with tracheostomy/ PEG secondary to prior cervical epidural abscess/ quadriplegia  Moderate bilateral pleural effusions HCAP +/- pulmonary edema Pulmonary blood cultures with  MRSA Urine cultures positive for E. Coli and Klebsiella pne Plan:  Full vent support, no weaning Titrate O2 for sat of 88-92% Vent bundle wean for toleration On vanc and meropenem  Continue to monitor pleural effusions Maintain current trach size and  type  Best practice:  Diet: NPO, PEG tube feeds resumed Pain/Anxiety/Delirium protocol (if indicated): not needed VAP protocol (if indicated): yes DVT prophylaxis: SCDs GI prophylaxis: PPI Glucose control: CBG q 4, SSI Mobility: PT/ OT Code Status: full  Family Communication: per primary, patient updated on plan of care Disposition: admit TRH/ PCCM for chronic vent managment  Labs   CBC: Recent Labs  Lab 12/23/19 1604 12/23/19 2138 12/24/19 0232 12/24/19 0233 12/25/19 0129 12/26/19 0251  WBC 0.8*  --  0.7*  --  0.8* 0.9*  NEUTROABS 0.0*  --   --   --  0.0* 0.0*  HGB 6.2* 7.8* 7.4*  --  7.1* 7.0*  HCT 20.9* 23.0* 24.2* 21.6* 23.5* 23.5*  MCV 100.0  --  98.8  --  97.1 97.5  PLT 255  --  245  --  207 476    Basic Metabolic Panel: Recent Labs  Lab 12/23/19 1604 12/23/19 2138 12/24/19 0232 12/25/19 0129 12/26/19 0251  NA 142 144 141 143 145  K 4.6 4.9 4.8 4.4 4.0  CL 107  --  106 107 109  CO2 29  --  28 26 32  GLUCOSE 177*  --  108* 103* 169*  BUN 69*  --  70* 76* 82*  CREATININE 1.24*  --  1.23* 1.39* 1.38*  CALCIUM 8.6*  --  8.5* 8.3* 8.3*   GFR: Estimated Creatinine Clearance: 49.2 mL/min (A) (by C-G formula based on SCr of 1.38 mg/dL (H)). Recent Labs  Lab 12/23/19 1604 12/23/19 1610 12/24/19 0232 12/25/19 0129 12/26/19 0251  PROCALCITON  --   --  0.32 0.32  --   WBC 0.8*  --  0.7* 0.8* 0.9*  LATICACIDVEN  --  1.0  --   --   --     Liver Function Tests: Recent Labs  Lab 12/23/19 1604 12/24/19 0232 12/25/19 0129  AST 16 17 14*  ALT 40 38 33  ALKPHOS 247* 206* 175*  BILITOT 0.6 0.7 0.7  PROT 6.8 6.9 6.6  ALBUMIN 1.2* 1.3* 1.3*   No results for input(s): LIPASE, AMYLASE in the last 168 hours. No results for input(s): AMMONIA in the last 168 hours.  ABG    Component Value Date/Time   PHART 7.455 (H) 12/23/2019 2138   PCO2ART 43.6 12/23/2019 2138   PO2ART 80.0 (L) 12/23/2019 2138   HCO3 30.7 (H) 12/23/2019 2138   TCO2 32 12/23/2019 2138    O2SAT 96.0 12/23/2019 2138     Coagulation Profile: Recent Labs  Lab 12/23/19 1623  INR 1.2    Cardiac Enzymes: No results for input(s): CKTOTAL, CKMB, CKMBINDEX, TROPONINI in the last 168 hours.  HbA1C: No results found for: HGBA1C  CBG: Recent Labs  Lab 12/25/19 0451 12/25/19 0806 12/25/19 1118 12/25/19 1600 12/25/19 1947  GLUCAP [phone] Auxier, MD [facility] Internal Medicine, PGY-3 Pager # [phone]  Please see Attending A/P and/or Addendum for final recommendations.  Attending Note:  64 year old year old female with chronic trach/vent due to quad from cervical epidural abscess.  Overnight, no events.  On exam, not weaning with coarse BS diffusely.  I reviewed CXR myself, trach is in a good position.  Discussed with  resident.  VDRF:  - Full vent support  Trach status:  - Change trach per protocol, same size and type  Sepsis:   - MRSA bacteremia on vanc  - ESBL E. Coli and Klebsiella on merrem  Malnutrition:  - Consult nutrition for TF  PCCM will continue to follow  Rush Farmer, M.D. [facility] Pulmonary/Critical Care Medicine.

## 2019-12-26 NOTE — Procedures (Signed)
Objective Swallowing Evaluation: Type of Study: FEES-Fiberoptic Endoscopic Evaluation of Swallow   Patient Details  Name: Gabrielle Wade MRN: [id number] Date of Birth: March 18, 1956  Today's Date: 12/26/2019 Time: SLP Start Time (ACUTE ONLY): 1358 -SLP Stop Time (ACUTE ONLY): 1415  SLP Time Calculation (min) (ACUTE ONLY): 17 min   Past Medical History:  Past Medical History:  Diagnosis Date  . Acute and chronic respiratory failure (acute-on-chronic) (HCC)   . Acute posthemorrhagic anemia   . Anemia in other chronic diseases classified elsewhere   . Anxiety   . Anxiety   . Asthma   . Bradycardia   . Chronic kidney disease, stage 3   . Compartment syndrome, unspecified, sequela   . COPD (chronic obstructive pulmonary disease) (HCC)   . 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 Diabetes mellitus without complication (HCC)   . Dysphagia, oropharyngeal   . GERD (gastroesophageal reflux disease)   . Hyperkalemia   . Hypotension   . Intraspinal abscess and granuloma   . MDD (major depressive disorder)   . MRSA (methicillin resistant Staphylococcus aureus)   . Presence of cardiac pacemaker   . Pressure ulcer of unspecified heel, unspecified stage   . PTSD (post-traumatic stress disorder)   . Quadriplegia [facility])    Past Surgical History:  Past Surgical History:  Procedure Laterality Date  . PEG TUBE PLACEMENT    . TRACHEOSTOMY     HPI: Conchita Truxillo  is a 64 y.o. female, w anxiety/ depression, PTSD,  hx of epidural abscess (mrsa), w quadriplegia , Dm2, CKD stage3,  Copd, Chronic Trach/ vent dependence, GERD, chronic dysphagia, PEG, Anemia, presents with ? Thrombocytopenia, Hgb 6.8, and wbc 0.6 per [facility]. Found to have sepsis, neutropenia, AKI, hypothermia, hypoxia, bacteremia. NO ST notes found in system.    No data recorded   Assessment / Plan / Recommendation  CHL IP CLINICAL IMPRESSIONS 12/26/2019  Clinical Impression Pt exhibited severe oral dysphagia marked by aspiration during  FEES with pt on full vent support (inline PMV not donned). Difficult to view pt's glottis given copious amount of secretions filing interarytenoid space and spiling toward/into airway. Cues for volitional swallow are delayed, effortful and ineffective. A small 1/4 teaspoon consumed and partially fell into airway prior to epiglottic deflection. Max vallecular and pyriform sinus residue present post swallow. No further trials given and therapist was able to retrieve partial residual using Yankeur suction. Pt should not initiate consumption of any consistency presently and recommend therapy focusing on using inline PMV to facilitate vocal cord adduction and cough to mobilize and control secretions. Continue oral care.         SLP Visit Diagnosis Dysphagia, pharyngeal phase (R13.13)  Attention and concentration deficit following --  Frontal lobe and executive function deficit following --  Impact on safety and function Severe aspiration risk      No flowsheet data found.   No flowsheet data found.  CHL IP DIET RECOMMENDATION 12/26/2019  SLP Diet Recommendations NPO  Liquid Administration via --  Medication Administration Via alternative means  Compensations --  Postural Changes --      No flowsheet data found.    CHL IP FOLLOW UP RECOMMENDATIONS 12/26/2019  Follow up Recommendations LTACH      CHL IP FREQUENCY AND DURATION 12/25/2019  Speech Therapy Frequency (ACUTE ONLY) min 1 x/week  Treatment Duration --           CHL IP ORAL PHASE 12/26/2019  Oral Phase WFL  Oral - Pudding Teaspoon --  Oral - Pudding Cup --  Oral - Honey Teaspoon --  Oral - Honey Cup --  Oral - Nectar Teaspoon --  Oral - Nectar Cup --  Oral - Nectar Straw --  Oral - Thin Teaspoon --  Oral - Thin Cup --  Oral - Thin Straw --  Oral - Puree --  Oral - Mech Soft --  Oral - Regular --  Oral - Multi-Consistency --  Oral - Pill --  Oral Phase - Comment --    CHL IP PHARYNGEAL PHASE 12/26/2019  Pharyngeal Phase  Impaired  Pharyngeal- Pudding Teaspoon --  Pharyngeal --  Pharyngeal- Pudding Cup --  Pharyngeal --  Pharyngeal- Honey Teaspoon --  Pharyngeal --  Pharyngeal- Honey Cup --  Pharyngeal --  Pharyngeal- Nectar Teaspoon --  Pharyngeal --  Pharyngeal- Nectar Cup --  Pharyngeal --  Pharyngeal- Nectar Straw --  Pharyngeal --  Pharyngeal- Thin Teaspoon --  Pharyngeal --  Pharyngeal- Thin Cup --  Pharyngeal --  Pharyngeal- Thin Straw --  Pharyngeal --  Pharyngeal- Puree Penetration/Aspiration before swallow;Pharyngeal residue - valleculae;Pharyngeal residue - pyriform  Pharyngeal (No Data)  Pharyngeal- Mechanical Soft --  Pharyngeal --  Pharyngeal- Regular --  Pharyngeal --  Pharyngeal- Multi-consistency --  Pharyngeal --  Pharyngeal- Pill --  Pharyngeal --  Pharyngeal Comment --     No flowsheet data found.   Houston Siren 12/26/2019, 3:28 PM   Orbie Pyo Colvin Caroli.Ed Risk analyst [phone] Office [phone]

## 2019-12-26 NOTE — Progress Notes (Signed)
PROGRESS NOTE  Gabrielle Wade JGG:[id number] DOB: [birth year] DOA: 12/23/2019 PCP: Townsend Roger, MD  Brief History   Gabrielle Wade  is a 64 y.o. female, w anxiety/ depression,PTSD,  hx of epidural abscess (mrsa), w quadriplegia , Dm2, CKD stage3,  Copd, Chronic Trach/ vent dependence, Gerd, chronic dysphagia, Anemia, presents with ? Thrombocytopenia, Hgb 6.8, and wbc 0.6 per [facility].    Pt notes slight dyspnea and dry cough, but unable to tell me how long this has been going on . Pt denies fever ,chills, cp, palp, n/v, diarrhea, brbpr, black stool.    In the ED the patient was found to have Temp of 95.5 and SaO2 of 96% on 30% FIO2. CXR demonstrates scattered bilateral airspace opacities, atelectasis, and cardiomegaly with volume overload. WBC was 0.8, Hgb 6.2, and creatinine of 1.24 with Alk phos 247. FOBT was negative. Blood cultures x 2 were obtained. They have grown out MRSA from one bottle and GNR from the other. ID has been consulted as has hematology/oncology. PCCM has been consulted for management of the patient's trach and vent.  The patient is receiving IV Meropenem and Vancomycin. She has been diuresed with Lasix 20 mg IV x 1.  Tube feeds have been restarted.  Echocardiogram has been performed. It has demonsdtrated an EF of 50-55% and Grade II diastolic dysfunction.  Consultants  . PCCM . Infectious disease . Hematology/Oncology . Interventional radiology  Procedures  . None  Antibiotics   Anti-infectives (From admission, onward)   Start     Dose/Rate Route Frequency Ordered Stop   12/27/19 0609  vancomycin (VANCOCIN) IVPB 1000 mg/200 mL premix     1,000 mg 200 mL/hr over 60 Minutes Intravenous Every 24 hours 12/26/19 0742     12/26/19 2200  meropenem (MERREM) 1 g in sodium chloride 0.9 % 100 mL IVPB     1 g 200 mL/hr over 30 Minutes Intravenous Every 12 hours 12/26/19 1337     12/26/19 1800  rifampin (RIFADIN) 60 mg/mL oral suspension 600 mg     600 mg Per Tube  Daily-1800 12/25/19 2207     12/26/19 1000  fluconazole (DIFLUCAN) tablet 200 mg     200 mg Per Tube Daily 12/26/19 0514     12/26/19 0600  vancomycin (VANCOCIN) IVPB 1000 mg/200 mL premix  Status:  Discontinued     1,000 mg 200 mL/hr over 60 Minutes Intravenous Every 48 hours 12/24/19 0512 12/26/19 0742   12/25/19 1900  meropenem (MERREM) 1 g in sodium chloride 0.9 % 100 mL IVPB  Status:  Discontinued     1 g 200 mL/hr over 30 Minutes Intravenous Every 8 hours 12/25/19 1838 12/26/19 1337   12/25/19 1630  rifampin (RIFADIN) 60 mg/mL oral suspension 600 mg  Status:  Discontinued     600 mg Per Tube Daily 12/25/19 1553 12/25/19 2207   12/24/19 2250  meropenem (MERREM) 1 g in sodium chloride 0.9 % 100 mL IVPB  Status:  Discontinued     1 g 200 mL/hr over 30 Minutes Intravenous Every 8 hours 12/24/19 1942 12/25/19 1838   12/24/19 1815  fluconazole (DIFLUCAN) 40 MG/ML suspension 200 mg  Status:  Discontinued     200 mg Per Tube Daily 12/24/19 1804 12/26/19 0514   12/24/19 1445  fluconazole (DIFLUCAN) 40 MG/ML suspension 200 mg  Status:  Discontinued     200 mg Per Tube Daily 12/24/19 1436 12/24/19 1804   12/24/19 1000  ceFEPIme (MAXIPIME) 2 g in sodium chloride 0.9 %  100 mL IVPB  Status:  Discontinued     2 g 200 mL/hr over 30 Minutes Intravenous Every 12 hours 12/24/19 0512 12/24/19 0859   12/24/19 1000  meropenem (MERREM) 1 g in sodium chloride 0.9 % 100 mL IVPB  Status:  Discontinued     1 g 200 mL/hr over 30 Minutes Intravenous Every 12 hours 12/24/19 0859 12/24/19 1942   12/24/19 0315  vancomycin (VANCOREADY) IVPB 1250 mg/250 mL     1,250 mg 166.7 mL/hr over 90 Minutes Intravenous  Once 12/24/19 0250 12/24/19 0819   12/23/19 2045  ceFEPIme (MAXIPIME) 2 g in sodium chloride 0.9 % 100 mL IVPB     2 g 200 mL/hr over 30 Minutes Intravenous  Once 12/23/19 2030 12/23/19 2201      Interval History/Subjective  The patient is resting comfortably. No new complaints.  Objective   Vitals:   Vitals:   12/26/19 1300 12/26/19 1400  BP: (!) 129/43 (!) 130/44  Pulse: 72 74  Resp: 16 14  Temp:    SpO2: 100% 100%    Exam:  Constitutional:  . The patient is awake, alert, and oriented x 3. No acute distress. Respiratory:  . Positive for increased work of breathing. . Diminished breath sounds bilateral . Mild bilateral rales . No wheezes or rhonchi are appreciated. . No tactile fremitus Cardiovascular:  . Regular rate and rhythm . No murmurs, ectopy, or gallups. . No lateral PMI. No thrills. Abdomen:  . Abdomen is soft, non-tender, non-distended . No hernias, masses, or organomegaly . Normoactive bowel sounds.  Musculoskeletal:  . No cyanosis, clubbing, or edema Skin:  . No rashes, lesions, ulcers . palpation of skin: no induration or nodules Neurologic:  . CN 2-12 intact . Pt is a quadriplegic Psychiatric:  . Mental status o Mood, affect appropriate o Orientation to person, place, time  . judgment and insight appear intact  I have personally reviewed the following:   Today's Data  . Vitals, BMP, CBC  Micro Data  . Blood Cultures: 1 positive for Klebsiella pneumoniae, 1 positive for E. Coli, MRSA positive  Cardiology Data  . Echo performed, EF 55-60%. Grade II diastolic dysfunction. Normal Global RV systolic function.   Scheduled Meds: . sodium chloride   Intravenous Once  . chlorhexidine gluconate (MEDLINE KIT)  15 mL Mouth Rinse BID  . Chlorhexidine Gluconate Cloth  6 each Topical Daily  . feeding supplement (PRO-STAT SUGAR FREE 64)  30 mL Per Tube TID  . ferrous sulfate  300 mg Per Tube Q breakfast  . fluconazole  200 mg Per Tube Daily  . insulin aspart  0-9 Units Subcutaneous Q4H  . mouth rinse  15 mL Mouth Rinse 10 times per day  . mupirocin ointment  1 application Nasal BID  . nutrition supplement (JUVEN)  1 packet Per Tube BID  . nystatin  5 mL Oral TID AC & HS  . pantoprazole sodium  40 mg Per Tube Q1200  . rifampin  600 mg Per Tube q1800   . sodium chloride flush  10-40 mL Intracatheter Q12H   Continuous Infusions: . feeding supplement (JEVITY 1.5 CAL/FIBER) 1,000 mL (12/25/19 1956)  . meropenem (MERREM) IV    . [START ON 12/27/2019] vancomycin      Principal Problem:   MRSA bacteremia Active Problems:   Anemia   Neutropenia (HCC)   Quadriplegia (HCC)   Diabetes ([place])   E coli bacteremia   Pressure ulcer of left heel, stage 3 ([place])  Edema of right upper arm   Lead-less pacemaker   LOS: 3 days   A & P  Sepsis: Neutropenia, AKI, Hypothermia, Hypoxia, MRSA Bacteremia, GNR bacteremia, pulmonary edema due to sepsis.  Bacteremia: MRSA and Klebsiella pneumonia and E. Coli. Infectious disease consulted. Echocardiogram has been performed and demonstrates no evidence of endocarditis, . The patient is receiving IV Vancomycin and meropenem. 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 This resulted in her quadriplegia. She has been at [facility] for a month. Possible sources include, osteomyelitis of C-spine, PICC, foley catheter, heel wound. Left heel to be x-rayed to investigate osteomyelitis.TTE was unrevealing in terms of endocarditis. There is concern for the leadless pacemaker device that is seated in the ventricle wall. Rifampin has been added to coverage due to leadless pacemaker. Infectious disease has recommended IV vancomycin with rifampin daily for 6 weeks with resumption of suppressive cephalexin for contaminated hardware in the neck from previous surgery. Meropenem is to be continued for ESBL UTI. Last day for Meropenem will be 12/31/2019.  Neutropenia/Anemia: Severe. Likely due to Sepsis from bacteremia. Hematology consulted. The patient has been transfused with 2 units PRBC's. The plan is for bone marrow biopsy. I appreciate hematology/oncology. SPEP, Folate RBC pending, ANA negative, ESR elevated at 125, reticulocyte and DAT pending.   Acute on chronic  respiratory failure, tracheostomy and vent dependence: PCCM consultated. CXR demonstrated scattered bilateral opacities secondary to developing pulmonary edema due to acute on chronic diastolic stage II exacerbation, or atypical infectious process. Atelectasis also present. She continues to have oxygen saturation with 40% O2 by mechanical ventilation. PASSY MUIR Valve.   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 PT/OT will be consulted to maintain range of motion.   Right upper extremity swelling: Doppler of the right upper extremity is negative for DVT. Swelling is likely due to PICC in this extremity. It is expected that this will be removed.   AKI: Creatinine is 1.38. It is unknown what the baseline creatinine is, however, Given her reduced muscle mass from quadriplegia, I would be surprised if it were greater than 1.0.  Diabetes: HbA1c pending. The patient's glucoses have run 80-100 over the past few hours without any insulin coverage, although she has been NPO. Tube feeds are to be restarted. Will continue the patient's NPH once tube feeds have been restarted.   Hypoalbuminemia: Pt was receiving Isosource 1.5 cal at 55 cc/hr prior to admission. Dietary has been consulted for continuation of tube feeds per PEG.  Abnormal TSH: Euthyroid Sick Syndrome  I have seen and examined this patient myself. I have spent 32 minutes in his evaluation and care.  Keefer Soulliere, DO [facility] Direct contact: see [email or web address]  7PM-7AM contact night coverage as above 12/26/2019, 2:58 PM  LOS: 1 day

## 2019-12-26 NOTE — Progress Notes (Signed)
PHARMACY NOTE:  ANTIMICROBIAL RENAL DOSAGE ADJUSTMENT  Current antimicrobial regimen includes a mismatch between antimicrobial dosage and estimated renal function.  As per policy approved by the Pharmacy & Therapeutics and Medical Executive Committees, the antimicrobial dosage will be adjusted accordingly.  Current antimicrobial dosage:  Vancomycin 1000mg  q48h  Indication: MRSA bacteremia   Renal Function:  Estimated Creatinine Clearance: 49.2 mL/min (A) (by C-G formula based on SCr of 1.38 mg/dL (H)). []      On intermittent HD, scheduled: []      On CRRT    Antimicrobial dosage has been changed to:  1000mg  q24h  Additional comments: Patient has poor renal function and is quadriplegic but I think she will tolerate a q24h regimen. She has not been on vancomycin in the past to help guide dosing. Given her indication I will increase to a daily regimen and see how she tolerates   Thank you for allowing pharmacy to be a part of this patient's care.  , [facility] 12/26/2019 7:39 AM

## 2019-12-26 NOTE — Progress Notes (Addendum)
PHARMACY NOTE:  ANTIMICROBIAL RENAL DOSAGE ADJUSTMENT  Current antimicrobial regimen includes a mismatch between antimicrobial dosage and estimated renal function.  As per policy approved by the Pharmacy & Therapeutics and Medical Executive Committees, the antimicrobial dosage will be adjusted accordingly.  Current antimicrobial dosage:  Meropenem 1 gm IV Q 8 hrs  Indication: E coli bacteremia (high risk for resistance)  Renal Function:  Estimated Creatinine Clearance: 49.2 mL/min (A) (by C-G formula based on SCr of 1.38 mg/dL (H)). []      On intermittent HD, scheduled: []      On CRRT    Antimicrobial dosage has been changed to:  Meropenem 1 gm IV Q 12 h *Pt is quadriplegic which may impact her GFR  Additional comments: Pharmacy will continue to monitor renal function and adjust as needed  12/26/2019 1:36 PM

## 2019-12-26 NOTE — Progress Notes (Signed)
  Speech Language Pathology  Patient Details Name: Gabrielle Wade MRN: [id number] DOB: 10-Dec-1956 Today's Date: 12/26/2019 Time:  -     FEES swallow assessment recommended which is scheduled today (in pt's room) at 1430- but may be sooner_ SLP will update RN.  GO                Royce Macadamia 12/26/2019, 11:34 AM  Breck Coons Lonell Face.Ed Nurse, children's [phone] Office [phone]

## 2019-12-27 DIAGNOSIS — B377 Candidal sepsis: Secondary | ICD-10-CM | POA: Diagnosis not present

## 2019-12-27 DIAGNOSIS — B379 Candidiasis, unspecified: Secondary | ICD-10-CM

## 2019-12-27 LAB — GLUCOSE, CAPILLARY
Glucose-Capillary: 178 mg/dL — ABNORMAL HIGH (ref 70–99)
Glucose-Capillary: 182 mg/dL — ABNORMAL HIGH (ref 70–99)
Glucose-Capillary: 183 mg/dL — ABNORMAL HIGH (ref 70–99)
Glucose-Capillary: 187 mg/dL — ABNORMAL HIGH (ref 70–99)
Glucose-Capillary: 242 mg/dL — ABNORMAL HIGH (ref 70–99)

## 2019-12-27 LAB — BPAM RBC
Blood Product Expiration Date: 202101192359
Blood Product Expiration Date: 202101242359
ISSUE DATE / TIME: 202101042228
Unit Type and Rh: 5100
Unit Type and Rh: 5100

## 2019-12-27 LAB — TYPE AND SCREEN
ABO/RH(D): O POS
Antibody Screen: POSITIVE
DAT, IgG: POSITIVE
Donor AG Type: NEGATIVE
Donor AG Type: NEGATIVE
PT AG Type: NEGATIVE
Unit division: 0
Unit division: 0

## 2019-12-27 LAB — BASIC METABOLIC PANEL
Anion gap: 7 (ref 5–15)
BUN: 95 mg/dL — ABNORMAL HIGH (ref 8–23)
CO2: 29 mmol/L (ref 22–32)
Calcium: 8.4 mg/dL — ABNORMAL LOW (ref 8.9–10.3)
Chloride: 111 mmol/L (ref 98–111)
Creatinine, Ser: 1.28 mg/dL — ABNORMAL HIGH (ref 0.44–1.00)
GFR calc Af Amer: 51 mL/min — ABNORMAL LOW (ref >60)
GFR calc non Af Amer: 44 mL/min — ABNORMAL LOW (ref >60)
Glucose, Bld: 193 mg/dL — ABNORMAL HIGH (ref 70–99)
Potassium: 4 mmol/L (ref 3.5–5.1)
Sodium: 147 mmol/L — ABNORMAL HIGH (ref 135–145)

## 2019-12-27 LAB — CBC
HCT: 24.5 % — ABNORMAL LOW (ref 36.0–46.0)
Hemoglobin: 7.2 g/dL — ABNORMAL LOW (ref 12.0–15.0)
MCH: 29.5 pg (ref 26.0–34.0)
MCHC: 29.4 g/dL — ABNORMAL LOW (ref 30.0–36.0)
MCV: 100.4 fL — ABNORMAL HIGH (ref 80.0–100.0)
Platelets: 185 10*3/uL (ref 150–400)
RBC: 2.44 MIL/uL — ABNORMAL LOW (ref 3.87–5.11)
RDW: 18.5 % — ABNORMAL HIGH (ref 11.5–15.5)
WBC: 1.2 10*3/uL — CL (ref 4.0–10.5)
nRBC: 0 % (ref 0.0–0.2)

## 2019-12-27 MED ORDER — FLUCONAZOLE IN SODIUM CHLORIDE 400-0.9 MG/200ML-% IV SOLN
800.0000 mg | Freq: Once | INTRAVENOUS | Status: DC
  Filled 2019-12-27 (×2): qty 400

## 2019-12-27 MED ORDER — IPRATROPIUM-ALBUTEROL 0.5-2.5 (3) MG/3ML IN SOLN
3.0000 mL | Freq: Three times a day (TID) | RESPIRATORY_TRACT | Status: DC
  Administered 2019-12-27 – 2020-01-07 (×32): 3 mL via RESPIRATORY_TRACT
  Filled 2019-12-27 (×32): qty 3

## 2019-12-27 MED ORDER — FLUCONAZOLE IN SODIUM CHLORIDE 400-0.9 MG/200ML-% IV SOLN
400.0000 mg | INTRAVENOUS | Status: AC
  Administered 2019-12-27 (×2): 400 mg via INTRAVENOUS
  Filled 2019-12-27: qty 200

## 2019-12-27 MED ORDER — FLUCONAZOLE IN SODIUM CHLORIDE 400-0.9 MG/200ML-% IV SOLN
400.0000 mg | INTRAVENOUS | Status: DC
  Administered 2019-12-28 – 2020-01-07 (×11): 400 mg via INTRAVENOUS
  Filled 2019-12-27 (×12): qty 200

## 2019-12-27 MED ORDER — FLUCONAZOLE IN SODIUM CHLORIDE 200-0.9 MG/100ML-% IV SOLN: 200.0000 mg | INTRAVENOUS | Status: DC

## 2019-12-27 MED ORDER — IPRATROPIUM-ALBUTEROL 0.5-2.5 (3) MG/3ML IN SOLN
3.0000 mL | RESPIRATORY_TRACT | Status: DC | PRN
  Administered 2019-12-27 – 2020-01-01 (×4): 3 mL via RESPIRATORY_TRACT
  Filled 2019-12-27 (×3): qty 3

## 2019-12-27 NOTE — Progress Notes (Signed)
Spoke w/ pts dtr to provide updates. Pts dtr appreciative.  

## 2019-12-27 NOTE — Progress Notes (Addendum)
[facility] for Infectious Disease  Date of Admission:  12/23/2019      Total days of antibiotics 4   Vancomycin day 5  Meropenem day 4  Fluconazole day 4 (high dose day 1)    ASSESSMENT: Gabrielle Wade is a 64 y.o. 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   Unfortunately her blood cultures drawn from 1/06 are now growing candida parapsilosis; this is sensitive to fluconazole and will increase to 400 mg IV daily. Unfortunately this makes her current midline a problem - would consult IV team to see if they can place a peripheral line via ultrasound while we try to clear her candidemia. If unable to place a peripheral, would recommend removing current midline (considered contaminated) and placing a new midline. Would not place PICC yet until we have officially cleared both her MRSA bacteremia and now candidemia.   ESBL producing EColi in the blood secondary to urinary tract infection - would treat with 7 days total meropenem then stop on January 12th.    She is indicating she "wants to go home." Would like to have more discussion with palliative care for goals, per the patient's request.     PLAN: 1. Continue Vancomycin + p.o. Rifampin for MRSA infection 2. Continue Meropenem for ESBL EColi bacteremia/UTI with last dose to complete  3. Increase fluconazole to 400 mg IV  4. Repeat blood cultures  5. Please consult IV team to assess vascular access for possibility of durable short term peripheral IV  6. Would bring palliative care medicine team in to discuss goals of care.    Principal Problem:   MRSA bacteremia Active Problems:   E coli bacteremia   Pressure ulcer of left heel, stage 3 (HCC)   Lead-less pacemaker   Anemia   Neutropenia (HCC)   Quadriplegia (HCC)   Diabetes (HCC)   Edema of right upper arm   Fungemia   . sodium chloride   Intravenous Once  .  chlorhexidine gluconate (MEDLINE KIT)  15 mL Mouth Rinse BID  . Chlorhexidine Gluconate Cloth  6 each Topical Daily  . feeding supplement (PRO-STAT SUGAR FREE 64)  30 mL Per Tube TID  . ferrous sulfate  300 mg Per Tube Q breakfast  . insulin aspart  0-9 Units Subcutaneous Q4H  . mouth rinse  15 mL Mouth Rinse 10 times per day  . mupirocin ointment  1 application Nasal BID  . nutrition supplement (JUVEN)  1 packet Per Tube BID  . nystatin  5 mL Oral TID AC & HS  . pantoprazole sodium  40 mg Per Tube Q1200  . rifampin  600 mg Per Tube q1800  . sodium chloride flush  10-40 mL Intracatheter Q12H    SUBJECTIVE: Very itchy on scalp and neck/ears.  Wants to "go home."   Review of Systems: Review of Systems  Constitutional: Negative for chills, diaphoresis, fever and malaise/fatigue.  Respiratory: Negative for cough and sputum production.   Cardiovascular: Negative for chest pain.  Gastrointestinal: Negative for abdominal pain and diarrhea.  Genitourinary:       Chronic indwelling foley   Musculoskeletal:       Quadriplegia     Allergies  Allergen Reactions  . Codeine   . Hydrocodone   . Influenza A (H1n1) Monovalent Vaccine   . Latex   . Morphine And Related   . Penicillins   . Shellfish  Allergy   . Tape     OBJECTIVE: Vitals:   12/27/19 1200 12/27/19 1207 12/27/19 1300 12/27/19 1400  BP: (!) 141/46  (!) 124/45 (!) 122/47  Pulse: 67  83 77  Resp: 20  (!) 21 20  Temp:  98.1 F (36.7 C)    TempSrc:  Axillary    SpO2: 93% 95% 94% 93%  Weight:      Height:       Body mass index is 28.63 kg/m.  Physical Exam Constitutional:      Appearance: She is ill-appearing.  HENT:     Mouth/Throat:     Mouth: Mucous membranes are moist.     Pharynx: Oropharynx is clear.     Comments: Clear, only slightly coated with white patch Cardiovascular:     Rate and Rhythm: Normal rate and regular rhythm.     Heart sounds: No murmur.  Pulmonary:     Effort: Pulmonary effort is  normal.     Breath sounds: Normal breath sounds.  Abdominal:     General: There is no distension.     Tenderness: There is no abdominal tenderness.  Musculoskeletal:     Comments: Quadriplegia R>L upper arm swelling. Cool edematous extremities.   Skin:    Capillary Refill: Capillary refill takes less than 2 seconds.     Findings: Bruising present.  Neurological:     Mental Status: She is alert.  Psychiatric:     Comments: Anxious      Lab Results Lab Results  Component Value Date   WBC 1.2 (LL) 12/27/2019   HGB 7.2 (L) 12/27/2019   HCT 24.5 (L) 12/27/2019   MCV 100.4 (H) 12/27/2019   PLT 185 12/27/2019    Lab Results  Component Value Date   CREATININE 1.28 (H) 12/27/2019   BUN 95 (H) 12/27/2019   NA 147 (H) 12/27/2019   K 4.0 12/27/2019   CL 111 12/27/2019   CO2 29 12/27/2019    Lab Results  Component Value Date   ALT 33 12/25/2019   AST 14 (L) 12/25/2019   ALKPHOS 175 (H) 12/25/2019   BILITOT 0.7 12/25/2019     Microbiology: Recent Results (from the past 240 hour(s))  Blood culture (routine x 2)     Status: Abnormal   Collection Time: 12/23/19  4:25 PM   Specimen: BLOOD RIGHT ARM  Result Value Ref Range Status   Specimen Description BLOOD RIGHT ARM  Final   Special Requests   Final    BOTTLES DRAWN AEROBIC AND ANAEROBIC Blood Culture adequate volume   Culture  Setup Time   Final    GRAM POSITIVE COCCI IN BOTH AEROBIC AND ANAEROBIC BOTTLES CRITICAL VALUE NOTED.  VALUE IS CONSISTENT WITH PREVIOUSLY REPORTED AND CALLED VALUE.    Culture (A)  Final    STAPHYLOCOCCUS AUREUS SUSCEPTIBILITIES PERFORMED ON PREVIOUS CULTURE WITHIN THE LAST 5 DAYS. Performed at [facility], [place] [address] [place], [place] [address]    Report Status 12/26/2019 FINAL  Final  Respiratory Panel by RT PCR (Flu A&B, Covid) - Nasopharyngeal Swab     Status: None   Collection Time: 12/23/19  4:27 PM   Specimen: Nasopharyngeal Swab  Result Value Ref Range Status   SARS  Coronavirus 2 by RT PCR NEGATIVE NEGATIVE Final    Comment: (NOTE) SARS-CoV-2 target nucleic acids are NOT DETECTED. The SARS-CoV-2 RNA is generally detectable in upper respiratoy specimens during the acute phase of infection. The lowest concentration of SARS-CoV-2 viral  copies this assay can detect is 131 copies/mL. A negative result does not preclude SARS-Cov-2 infection and should not be used as the sole basis for treatment or other patient management decisions. A negative result may occur with  improper specimen collection/handling, submission of specimen other than nasopharyngeal swab, presence of viral mutation(s) within the areas targeted by this assay, and inadequate number of viral copies (<131 copies/mL). A negative result must be combined with clinical observations, patient history, and epidemiological information. The expected result is Negative. Fact Sheet for Patients:  PinkCheek.be Fact Sheet for Healthcare Providers:  GravelBags.it This test is not yet ap proved or cleared by the [place] FDA and  has been authorized for detection and/or diagnosis of SARS-CoV-2 by FDA under an Emergency Use Authorization (EUA). This EUA will remain  in effect (meaning this test can be used) for the duration of the COVID-19 declaration under Section 564(b)(1) of the Act, 21 U.S.C. section 360bbb-3(b)(1), unless the authorization is terminated or revoked sooner.    Influenza A by PCR NEGATIVE NEGATIVE Final   Influenza B by PCR NEGATIVE NEGATIVE Final    Comment: (NOTE) The Xpert Xpress SARS-CoV-2/FLU/RSV assay is intended as an aid in  the diagnosis of influenza from Nasopharyngeal swab specimens and  should not be used as a sole basis for treatment. Nasal washings and  aspirates are unacceptable for Xpert Xpress SARS-CoV-2/FLU/RSV  testing. Fact Sheet for Patients: PinkCheek.be Fact Sheet  for Healthcare Providers: GravelBags.it This test is not yet approved or cleared by the [place] FDA and  has been authorized for detection and/or diagnosis of SARS-CoV-2 by  FDA under an Emergency Use Authorization (EUA). This EUA will remain  in effect (meaning this test can be used) for the duration of the  Covid-19 declaration under Section 564(b)(1) of the Act, 21  U.S.C. section 360bbb-3(b)(1), unless the authorization is  terminated or revoked. Performed at [facility], [place] [address] [place], [place] [address]   Urine culture     Status: Abnormal   Collection Time: 12/23/19  4:51 PM   Specimen: In/Out Cath Urine  Result Value Ref Range Status   Specimen Description IN/OUT CATH URINE  Final   Special Requests   Final    NONE Performed at [facility], [place] [address], [place], [place] [address]    Culture (A)  Final    >=100,000 COLONIES/mL KLEBSIELLA PNEUMONIAE >=100,000 COLONIES/mL ESCHERICHIA COLI Confirmed Extended Spectrum Beta-Lactamase Producer (ESBL).  In bloodstream infections from ESBL organisms, carbapenems are preferred over piperacillin/tazobactam. They are shown to have a lower risk of mortality.    Report Status 12/25/2019 FINAL  Final   Organism ID, Bacteria KLEBSIELLA PNEUMONIAE (A)  Final   Organism ID, Bacteria ESCHERICHIA COLI (A)  Final      Susceptibility   Escherichia coli - MIC*    AMPICILLIN >=32 RESISTANT Resistant     CEFAZOLIN >=64 RESISTANT Resistant     CEFTRIAXONE >=64 RESISTANT Resistant     CIPROFLOXACIN >=4 RESISTANT Resistant     GENTAMICIN <=1 SENSITIVE Sensitive     IMIPENEM <=0.25 SENSITIVE Sensitive     NITROFURANTOIN <=16 SENSITIVE Sensitive     TRIMETH/SULFA <=20 SENSITIVE Sensitive     AMPICILLIN/SULBACTAM >=32 RESISTANT Resistant     PIP/TAZO 32 INTERMEDIATE Intermediate     * >=100,000 COLONIES/mL ESCHERICHIA COLI   Klebsiella pneumoniae - MIC*    AMPICILLIN >=32 RESISTANT  Resistant     CEFAZOLIN <=4 SENSITIVE Sensitive  CEFTRIAXONE <=0.25 SENSITIVE Sensitive     CIPROFLOXACIN <=0.25 SENSITIVE Sensitive     GENTAMICIN <=1 SENSITIVE Sensitive     IMIPENEM <=0.25 SENSITIVE Sensitive     NITROFURANTOIN 128 RESISTANT Resistant     TRIMETH/SULFA <=20 SENSITIVE Sensitive     AMPICILLIN/SULBACTAM 16 INTERMEDIATE Intermediate     PIP/TAZO <=4 SENSITIVE Sensitive     * >=100,000 COLONIES/mL KLEBSIELLA PNEUMONIAE  Blood culture (routine x 2)     Status: Abnormal   Collection Time: 12/23/19  4:58 PM   Specimen: BLOOD  Result Value Ref Range Status   Specimen Description BLOOD RIGHT THUMB  Final   Special Requests   Final    BOTTLES DRAWN AEROBIC AND ANAEROBIC Blood Culture adequate volume   Culture  Setup Time   Final    GRAM NEGATIVE RODS IN BOTH AEROBIC AND ANAEROBIC BOTTLES GRAM POSITIVE COCCI CRITICAL RESULT CALLED TO, READ BACK BY AND VERIFIED WITH: J. FRENS PHARMD, AT 0820 12/24/19 BY D.V [facility] Performed at [facility], [place] [address] [place], [place] [address]    Culture (A)  Final    ESCHERICHIA COLI STAPHYLOCOCCUS AUREUS Confirmed Extended Spectrum Beta-Lactamase Producer (ESBL).  In bloodstream infections from ESBL organisms, carbapenems are preferred over piperacillin/tazobactam. They are shown to have a lower risk of mortality.    Report Status 12/26/2019 FINAL  Final   Organism ID, Bacteria ESCHERICHIA COLI  Final   Organism ID, Bacteria STAPHYLOCOCCUS AUREUS  Final      Susceptibility   Escherichia coli - MIC*    AMPICILLIN >=32 RESISTANT Resistant     CEFAZOLIN >=64 RESISTANT Resistant     CEFEPIME 16 RESISTANT Resistant     CEFTAZIDIME RESISTANT Resistant     CEFTRIAXONE >=64 RESISTANT Resistant     CIPROFLOXACIN >=4 RESISTANT Resistant     GENTAMICIN <=1 SENSITIVE Sensitive     IMIPENEM <=0.25 SENSITIVE Sensitive     TRIMETH/SULFA <=20 SENSITIVE Sensitive     AMPICILLIN/SULBACTAM 16 INTERMEDIATE Intermediate     PIP/TAZO <=4  SENSITIVE Sensitive     * ESCHERICHIA COLI   Staphylococcus aureus - MIC*    CIPROFLOXACIN >=8 RESISTANT Resistant     ERYTHROMYCIN >=8 RESISTANT Resistant     GENTAMICIN <=0.5 SENSITIVE Sensitive     OXACILLIN >=4 RESISTANT Resistant     TETRACYCLINE >=16 RESISTANT Resistant     VANCOMYCIN <=0.5 SENSITIVE Sensitive     TRIMETH/SULFA <=10 SENSITIVE Sensitive     CLINDAMYCIN <=0.25 SENSITIVE Sensitive     RIFAMPIN <=0.5 SENSITIVE Sensitive     Inducible Clindamycin NEGATIVE Sensitive     * STAPHYLOCOCCUS AUREUS  Blood Culture ID Panel (Reflexed)     Status: Abnormal   Collection Time: 12/23/19  4:58 PM  Result Value Ref Range Status   Enterococcus species NOT DETECTED NOT DETECTED Final   Listeria monocytogenes NOT DETECTED NOT DETECTED Final   Staphylococcus species DETECTED (A) NOT DETECTED Final    Comment: CRITICAL RESULT CALLED TO, READ BACK BY AND VERIFIED WITH: J. FRENS PHARMD, AT 7517 12/24/19 BY D. VANHOOK    Staphylococcus aureus (BCID) DETECTED (A) NOT DETECTED Final    Comment: Methicillin (oxacillin)-resistant Staphylococcus aureus (MRSA). MRSA is predictably resistant to beta-lactam antibiotics (except ceftaroline). Preferred therapy is vancomycin unless clinically contraindicated. Patient requires contact precautions if  hospitalized. CRITICAL RESULT CALLED TO, READ BACK BY AND VERIFIED WITH: J. FRENS PHARMD, AT 0017 12/24/19 BY D. VANHOOK    Methicillin resistance DETECTED (A) NOT DETECTED Final  Comment: CRITICAL RESULT CALLED TO, READ BACK BY AND VERIFIED WITH: J. FRENS PHARMD, AT 1103 12/24/19 BY D. VANHOOK    Streptococcus species NOT DETECTED NOT DETECTED Final   Streptococcus agalactiae NOT DETECTED NOT DETECTED Final   Streptococcus pneumoniae NOT DETECTED NOT DETECTED Final   Streptococcus pyogenes NOT DETECTED NOT DETECTED Final   Acinetobacter baumannii NOT DETECTED NOT DETECTED Final   Enterobacteriaceae species DETECTED (A) NOT DETECTED Final    Comment:  Enterobacteriaceae represent a large family of gram-negative bacteria, not a single organism. CRITICAL RESULT CALLED TO, READ BACK BY AND VERIFIED WITH: J. FRENS PHARMD, AT 1594 12/24/19 BY D. VANHOOK    Enterobacter cloacae complex NOT DETECTED NOT DETECTED Final   Escherichia coli DETECTED (A) NOT DETECTED Final    Comment: CRITICAL RESULT CALLED TO, READ BACK BY AND VERIFIED WITH: J. FRENS PHARMD, AT 5859 12/24/19 BY D. VANHOOK    Klebsiella oxytoca NOT DETECTED NOT DETECTED Final   Klebsiella pneumoniae NOT DETECTED NOT DETECTED Final   Proteus species NOT DETECTED NOT DETECTED Final   Serratia marcescens NOT DETECTED NOT DETECTED Final   Carbapenem resistance NOT DETECTED NOT DETECTED Final   Haemophilus influenzae NOT DETECTED NOT DETECTED Final   Neisseria meningitidis NOT DETECTED NOT DETECTED Final   Pseudomonas aeruginosa NOT DETECTED NOT DETECTED Final   Candida albicans NOT DETECTED NOT DETECTED Final   Candida glabrata NOT DETECTED NOT DETECTED Final   Candida krusei NOT DETECTED NOT DETECTED Final   Candida parapsilosis NOT DETECTED NOT DETECTED Final   Candida tropicalis NOT DETECTED NOT DETECTED Final    Comment: Performed at [facility], [place] [address], [place], [place] [address]  MRSA PCR Screening     Status: Abnormal   Collection Time: 12/24/19  5:50 PM   Specimen: Nasal Mucosa; Nasopharyngeal  Result Value Ref Range Status   MRSA by PCR POSITIVE (A) NEGATIVE Final    Comment:        The GeneXpert MRSA Assay (FDA approved for NASAL specimens only), is one component of a comprehensive MRSA colonization surveillance program. It is not intended to diagnose MRSA infection nor to guide or monitor treatment for MRSA infections. RESULT CALLED TO, READ BACK BY AND VERIFIED WITH: Everlean Alstrom RN 2017 12/24/19 A BROWNING Performed at [facility], [place] [address] [place], [place] [address]   Culture, blood (routine x 2)     Status: Abnormal (Preliminary result)    Collection Time: 12/25/19 10:26 AM   Specimen: BLOOD RIGHT HAND  Result Value Ref Range Status   Specimen Description BLOOD RIGHT HAND  Final   Special Requests   Final    BOTTLES DRAWN AEROBIC ONLY Blood Culture adequate volume   Culture  Setup Time   Final    AEROBIC BOTTLE ONLY YEAST Organism ID to follow CRITICAL RESULT CALLED TO, READ BACK BY AND VERIFIED WITH: K HAMMONS PHARMD 12/26/19 2340 JDW Performed at [facility], [place] [address] [place], [place] [address]    Culture CANDIDA PARAPSILOSIS (A)  Final   Report Status PENDING  Incomplete  Blood Culture ID Panel (Reflexed)     Status: Abnormal   Collection Time: 12/25/19 10:26 AM  Result Value Ref Range Status   Enterococcus species NOT DETECTED NOT DETECTED Final   Listeria monocytogenes NOT DETECTED NOT DETECTED Final   Staphylococcus species NOT DETECTED NOT DETECTED Final   Staphylococcus aureus (BCID) NOT DETECTED NOT DETECTED Final   Streptococcus species NOT DETECTED  NOT DETECTED Final   Streptococcus agalactiae NOT DETECTED NOT DETECTED Final   Streptococcus pneumoniae NOT DETECTED NOT DETECTED Final   Streptococcus pyogenes NOT DETECTED NOT DETECTED Final   Acinetobacter baumannii NOT DETECTED NOT DETECTED Final   Enterobacteriaceae species NOT DETECTED NOT DETECTED Final   Enterobacter cloacae complex NOT DETECTED NOT DETECTED Final   Escherichia coli NOT DETECTED NOT DETECTED Final   Klebsiella oxytoca NOT DETECTED NOT DETECTED Final   Klebsiella pneumoniae NOT DETECTED NOT DETECTED Final   Proteus species NOT DETECTED NOT DETECTED Final   Serratia marcescens NOT DETECTED NOT DETECTED Final   Haemophilus influenzae NOT DETECTED NOT DETECTED Final   Neisseria meningitidis NOT DETECTED NOT DETECTED Final   Pseudomonas aeruginosa NOT DETECTED NOT DETECTED Final   Candida albicans NOT DETECTED NOT DETECTED Final   Candida glabrata NOT DETECTED NOT DETECTED Final   Candida krusei NOT DETECTED NOT DETECTED  Final   Candida parapsilosis DETECTED (A) NOT DETECTED Final    Comment: CRITICAL RESULT CALLED TO, READ BACK BY AND VERIFIED WITH: K HAMMONS PHARMD 12/26/19 2340 JDW    Candida tropicalis NOT DETECTED NOT DETECTED Final    Comment: Performed at [facility], [place] [address] [place], [place] [address]  Culture, blood (routine x 2)     Status: None (Preliminary result)   Collection Time: 12/25/19 10:34 AM   Specimen: BLOOD RIGHT HAND  Result Value Ref Range Status   Specimen Description BLOOD RIGHT HAND  Final   Special Requests   Final    BOTTLES DRAWN AEROBIC ONLY Blood Culture adequate volume   Culture  Setup Time AEROBIC BOTTLE ONLY YEAST   Final   Culture   Final    NO GROWTH 2 DAYS Performed at [facility], [place] [address] [place], [place] [address]    Report Status PENDING  Incomplete     Janene Madeira, MSN, NP-C [facility] [email or web address] Pager: [phone] Office: [phone] [place]: [phone]

## 2019-12-27 NOTE — Progress Notes (Addendum)
NAME:  Gabrielle Wade, MRN:  [id number], DOB:  18-Oct-1956, LOS: 4 ADMISSION DATE:  12/23/2019, CONSULTATION DATE:  12/22/2018 REFERRING MD:  Dr. 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 She is ventilator depended due to respiratory failure and was transferred from [facility] for evaluation of her leukopenia and anemia. PCCM was consulted by [facility] for chronic tracheostomy and ventilator management.   History of present illness   HPI obtained from medical chart review as patient is trach/ mechanical ventilator dependent.  No bedside medical records found, therefore limited HPI.   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  In ER, hemodynamically stable and initial temp 95.5.  Patient is awake in no distress on ventilator with settings PRVC, rate 12, FiO2 30%, TV 480, PEEP 5.  Unknown if patient weans.  Workup notable for WBC 0.8, Hgb 6.2, platelets 255, glucose 177, BUN 69, sCr 1.24, alk phos 247, albumin 1.2, corrected calcium 10.8, trop hs 5, lactic acid 1, normal coags, TSH 24.57, free T4 0.66, FOB negative, SARS2 PCR neg, UA negative.  CXR showing cardiomegaly with moderate bilateral pleural effusions, bilateral airspace and bibasilar opacities concerning for developing edema vs atypical infectious process vs atelectasis vs developing infiltrate.  TRH to admit, PCCM consulted for vent and tracheostomy management.   Past Medical History  Cervical epidural abscess- MRSA in 08/2019 with resultant quadriplegia with tracheostomy and ventilator dependent respiratory failure,  PEG, DMT2, CKD stage III, COPD, GERD, anxiety/ depression, PTSD  Significant Hospital Events   1/4 Admit [facility]  Consults:  PCCM - trach / vent   Procedures:  pta trach pta PEG   Significant Diagnostic Tests:   Micro Data:  1/4 SARS 2/ Flu A/B >> neg 1/4 UC >> >100K cfus of E coli and klebsiella pneumonia 1/4 BCx 2 >> preliminary staph species methicillin-resistant 1/4 urine strep >> (-) 1/4 urine legionella >> (-)  Antimicrobials:  1/4 cefepime >> 1/6 1/4 vanc >> 1/6 meropenem >>   Interim history/subjective:  No O/N Events, Patient remains on the vent/trach.   Objective   Blood pressure (!) 152/56, pulse 75, temperature 98.3 F (36.8 C), temperature source Oral, resp. rate 15, height 5' 10"  (1.778 m), weight 90.5 kg, SpO2 100 %.    Vent Mode: PRVC FiO2 (%):  [40 %] 40 % Set Rate:  [12 bmp] 12 bmp Vt Set:  [480 mL] 480 mL PEEP:  [5 cmH20] 5 cmH20 Plateau Pressure:  [18 cmH20-22 cmH20] 20 cmH20   Intake/Output Summary (Last 24 hours) at 12/27/2019 4403 Last data filed at 12/27/2019 0600 Gross per 24 hour  Intake 2006.66 ml  Output 1475 ml  Net 531.66 ml    Examination: General: Intubated, non-diaphoretic  HEENT: Normocephalic, atraumatic  Cardio: RRR, no murmurs, rubs, gallops Pulmonary: Crackles auscultated bilaterally Abdomen: BS normal, no distension, nontender to palpation  MSK: No pitting edema noted in BLE Neuro: Does not follow commands, opens eyes and looks around the room intermittently  Skin: dry and warm   Resolved Hospital Problem list     Assessment & Plan:   Chronic Respiratory Failure with Tracheostomy and PEG 2/2  to Cervical Epidural Abscess Resulting in Quadriplegia Requiring Ventilation Support:   - Full vent support, no weaning - Current vent settings 40/5/12/480 - Titrate O2 for sat of 88-92% - Maintain current trach size and type  HCAP +/- Pulmonary Edema:  - Pulmonary Blood Cultures with MRSA - Urine Cultures + E. Coli and  Klebsiella - On Vancomycin and Meropenem - Will continue to monitor pleural effusions  Caloric Malnutrition:  - Consulting Nutrition for TF Best practice:  Diet: NPO, PEG tube feeds resumed Pain/Anxiety/Delirium protocol (if indicated): not needed VAP protocol (if indicated): yes DVT prophylaxis: SCDs GI prophylaxis: PPI Glucose control: CBG q 4, SSI Mobility: PT/ OT Code Status: full  Family Communication: per primary, patient updated on plan of care Disposition: admit [facility]/ PCCM for chronic vent managment  Labs   CBC: Recent Labs  Lab 12/23/19 1604 12/23/19 2138 12/24/19 0232 12/24/19 0233 12/25/19 0129 12/26/19 0251 12/27/19 0335  WBC 0.8*  --  0.7*  --  0.8* 0.9* 1.2*  NEUTROABS 0.0*  --   --   --  0.0* 0.0*  --   HGB 6.2* 7.8* 7.4*  --  7.1* 7.0* 7.2*  HCT 20.9* 23.0* 24.2* 21.6* 23.5* 23.5* 24.5*  MCV 100.0  --  98.8  --  97.1 97.5 100.4*  PLT 255  --  245  --  207 209 254    Basic Metabolic Panel: Recent Labs  Lab 12/23/19 1604 12/23/19 2138 12/24/19 0232 12/25/19 0129 12/26/19 0251 12/27/19 0335  NA 142 144 141 143 145 147*  K 4.6 4.9 4.8 4.4 4.0 4.0  CL 107  --  106 107 109 111  CO2 29  --  28 26 32 29  GLUCOSE 177*  --  108* 103* 169* 193*  BUN 69*  --  70* 76* 82* 95*  CREATININE 1.24*  --  1.23* 1.39* 1.38* 1.28*  CALCIUM 8.6*  --  8.5* 8.3* 8.3* 8.4*   GFR: Estimated Creatinine Clearance: 53.5 mL/min (A) (by C-G formula based on SCr of 1.28 mg/dL (H)). Recent Labs  Lab 12/23/19 1610 12/24/19 0232 12/25/19 0129 12/26/19 0251 12/27/19 0335  PROCALCITON  --  0.32 0.32  --   --   WBC  --  0.7* 0.8* 0.9* 1.2*  LATICACIDVEN 1.0  --   --   --   --     Liver Function Tests: Recent Labs  Lab 12/23/19 1604 12/24/19 0232 12/25/19 0129  AST 16 17 14*  ALT 40 38 33  ALKPHOS 247* 206* 175*  BILITOT 0.6 0.7 0.7  PROT 6.8 6.9 6.6  ALBUMIN 1.2* 1.3* 1.3*   No results for input(s): LIPASE, AMYLASE in the last 168 hours. No results for  input(s): AMMONIA in the last 168 hours.  ABG    Component Value Date/Time   PHART 7.455 (H) 12/23/2019 2138   PCO2ART 43.6 12/23/2019 2138   PO2ART 80.0 (L) 12/23/2019 2138   HCO3 30.7 (H) 12/23/2019 2138   TCO2 32 12/23/2019 2138   O2SAT 96.0 12/23/2019 2138     Coagulation Profile: Recent Labs  Lab 12/23/19 1623  INR 1.2    Cardiac Enzymes: No results for input(s): CKTOTAL, CKMB, CKMBINDEX, TROPONINI in the last 168 hours.  HbA1C: No results found for: HGBA1C  CBG: Recent Labs  Lab 12/26/19 1110 12/26/19 1533 12/26/19 1936 12/26/19 2330 12/27/19 0333  GLUCAP 185* 195* 182* 180* 178*   [name], MD IMTS, PGY-1 Pager: [phone]  Please see Attending A/P and/or Addendum for  final recommendations.  Attending Note:  64 year old year old female with chronic trach/vent due to quad from cervical epidural abscess.  Overnight, no events.  On exam, not weaning with diminished BS diffusely.  I reviewed CXR myself, trach is in a good position.  Discussed with resident.  VDRF:             - Full vent support  Trach status:             - Trach care per protocol  Sepsis:              - MRSA bacteremia on vanc             - ESBL E. Coli and Klebsiella on merrem  Malnutrition:             - Consult nutrition for TF  PCCM will continue to follow  Rush Farmer, M.D. [facility] Pulmonary/Critical Care Medicine.

## 2019-12-27 NOTE — Progress Notes (Signed)
CSW spoke with Misty Stanley at [facility] who reports this patient's insurance recently switched to [facility] and that the facility is not in network with that payor. Misty Stanley advised that it would be in the patient's best interest for her to discharge to [facility] in [place] so that she would not receive a bill for services due to lack of insurance coverage. Misty Stanley reports that [facility] will accept the patient back at discharge if placement at [facility] cannot happen, however is concerned about the financial consequences for that decision.  CSW will follow and coordinate for discharge.  Edwin Dada, MSW, LCSW-A Transitions of Care  Clinical Social Worker  [facility] Emergency Departments  Medical ICU [phone]

## 2019-12-27 NOTE — Progress Notes (Signed)
PROGRESS NOTE  Gabrielle Wade XJD:[id number] DOB: [birth year] DOA: 12/23/2019 PCP: Townsend Roger, MD  Brief History   Gabrielle Wade  is a 64 y.o. female, w anxiety/ depression,PTSD,  hx of epidural abscess (mrsa), w quadriplegia , Dm2, CKD stage3,  Copd, Chronic Trach/ vent dependence, Gerd, chronic dysphagia, Anemia, presents with ? Thrombocytopenia, Hgb 6.8, and wbc 0.6 per [facility].    Pt notes slight dyspnea and dry cough, but unable to tell me how long this has been going on . Pt denies fever ,chills, cp, palp, n/v, diarrhea, brbpr, black stool.    In the ED the patient was found to have Temp of 95.5 and SaO2 of 96% on 30% FIO2. CXR demonstrates scattered bilateral airspace opacities, atelectasis, and cardiomegaly with volume overload. WBC was 0.8, Hgb 6.2, and creatinine of 1.24 with Alk phos 247. FOBT was negative. Blood cultures x 2 were obtained. They have grown out MRSA from one bottle and GNR from the other. ID has been consulted as has hematology/oncology. PCCM has been consulted for management of the patient's trach and vent.  The patient is receiving IV Meropenem and Vancomycin. She has been diuresed with Lasix 20 mg IV x 1.  Tube feeds have been restarted.  Echocardiogram has been performed. It has demonsdtrated an EF of 50-55% and Grade II diastolic dysfunction.  Blood cultures (1/2) drawn on 12/25/2019 have grown out Candida parapsilosis. The patient has been started on diflucan   Consultants  . PCCM . Infectious disease . Hematology/Oncology . Interventional radiology  Procedures  . None  Antibiotics   Anti-infectives (From admission, onward)   Start     Dose/Rate Route Frequency Ordered Stop   12/28/19 0800  fluconazole (DIFLUCAN) IVPB 200 mg  Status:  Discontinued     200 mg 100 mL/hr over 60 Minutes Intravenous Every 24 hours 12/27/19 0012 12/27/19 0724   12/28/19 0800  fluconazole (DIFLUCAN) IVPB 400 mg     400 mg 100 mL/hr over 120 Minutes Intravenous Every  24 hours 12/27/19 0724     12/27/19 0609  vancomycin (VANCOCIN) IVPB 1000 mg/200 mL premix     1,000 mg 200 mL/hr over 60 Minutes Intravenous Every 24 hours 12/26/19 0742     12/27/19 0230  fluconazole (DIFLUCAN) IVPB 400 mg     400 mg 100 mL/hr over 120 Minutes Intravenous Every 1 hr x 2 12/27/19 0222 12/27/19 0714   12/27/19 0100  fluconazole (DIFLUCAN) IVPB 800 mg  Status:  Discontinued     800 mg 200 mL/hr over 120 Minutes Intravenous  Once 12/27/19 0012 12/27/19 0222   12/26/19 2200  meropenem (MERREM) 1 g in sodium chloride 0.9 % 100 mL IVPB     1 g 200 mL/hr over 30 Minutes Intravenous Every 12 hours 12/26/19 1337     12/26/19 1800  rifampin (RIFADIN) 60 mg/mL oral suspension 600 mg     600 mg Per Tube Daily-1800 12/25/19 2207     12/26/19 1000  fluconazole (DIFLUCAN) tablet 200 mg  Status:  Discontinued     200 mg Per Tube Daily 12/26/19 0514 12/27/19 0012   12/26/19 0600  vancomycin (VANCOCIN) IVPB 1000 mg/200 mL premix  Status:  Discontinued     1,000 mg 200 mL/hr over 60 Minutes Intravenous Every 48 hours 12/24/19 0512 12/26/19 0742   12/25/19 1900  meropenem (MERREM) 1 g in sodium chloride 0.9 % 100 mL IVPB  Status:  Discontinued     1 g 200 mL/hr over 30 Minutes  Intravenous Every 8 hours 12/25/19 1838 12/26/19 1337   12/25/19 1630  rifampin (RIFADIN) 60 mg/mL oral suspension 600 mg  Status:  Discontinued     600 mg Per Tube Daily 12/25/19 1553 12/25/19 2207   12/24/19 2250  meropenem (MERREM) 1 g in sodium chloride 0.9 % 100 mL IVPB  Status:  Discontinued     1 g 200 mL/hr over 30 Minutes Intravenous Every 8 hours 12/24/19 1942 12/25/19 1838   12/24/19 1815  fluconazole (DIFLUCAN) 40 MG/ML suspension 200 mg  Status:  Discontinued     200 mg Per Tube Daily 12/24/19 1804 12/26/19 0514   12/24/19 1445  fluconazole (DIFLUCAN) 40 MG/ML suspension 200 mg  Status:  Discontinued     200 mg Per Tube Daily 12/24/19 1436 12/24/19 1804   12/24/19 1000  ceFEPIme (MAXIPIME) 2 g in  sodium chloride 0.9 % 100 mL IVPB  Status:  Discontinued     2 g 200 mL/hr over 30 Minutes Intravenous Every 12 hours 12/24/19 0512 12/24/19 0859   12/24/19 1000  meropenem (MERREM) 1 g in sodium chloride 0.9 % 100 mL IVPB  Status:  Discontinued     1 g 200 mL/hr over 30 Minutes Intravenous Every 12 hours 12/24/19 0859 12/24/19 1942   12/24/19 0315  vancomycin (VANCOREADY) IVPB 1250 mg/250 mL     1,250 mg 166.7 mL/hr over 90 Minutes Intravenous  Once 12/24/19 0250 12/24/19 0819   12/23/19 2045  ceFEPIme (MAXIPIME) 2 g in sodium chloride 0.9 % 100 mL IVPB     2 g 200 mL/hr over 30 Minutes Intravenous  Once 12/23/19 2030 12/23/19 2201      Interval History/Subjective  The patient is resting comfortably. No new complaints.  Objective   Vitals:  Vitals:   12/27/19 0830 12/27/19 1207  BP: (!) 157/55   Pulse:    Resp:    Temp:  98.1 F (36.7 C)  SpO2:  95%    Exam:  Constitutional:  . The patient is awake, alert, and oriented x 3. No acute distress. Respiratory:  . Positive for increased work of breathing. . Diminished breath sounds bilateral . Mild bilateral rales . No wheezes or rhonchi are appreciated. . No tactile fremitus Cardiovascular:  . Regular rate and rhythm . No murmurs, ectopy, or gallups. . No lateral PMI. No thrills. Abdomen:  . Abdomen is soft, non-tender, non-distended . No hernias, masses, or organomegaly . Normoactive bowel sounds.  Musculoskeletal:  . No cyanosis, clubbing, or edema Skin:  . No rashes, lesions, ulcers . palpation of skin: no induration or nodules Neurologic:  . CN 2-12 intact . Pt is a quadriplegic Psychiatric:  . Mental status o Mood, affect appropriate o Orientation to person, place, time  . judgment and insight appear intact  I have personally reviewed the following:   Today's Data  . Vitals, BMP, CBC  Micro Data  . Blood Cultures: 1 positive for Klebsiella pneumoniae, 1 positive for E. Coli, MRSA  positive  Cardiology Data  . Echo performed, EF 55-60%. Grade II diastolic dysfunction. Normal Global RV systolic function.   Scheduled Meds: . sodium chloride   Intravenous Once  . chlorhexidine gluconate (MEDLINE KIT)  15 mL Mouth Rinse BID  . Chlorhexidine Gluconate Cloth  6 each Topical Daily  . feeding supplement (PRO-STAT SUGAR FREE 64)  30 mL Per Tube TID  . ferrous sulfate  300 mg Per Tube Q breakfast  . insulin aspart  0-9 Units Subcutaneous Q4H  .  mouth rinse  15 mL Mouth Rinse 10 times per day  . mupirocin ointment  1 application Nasal BID  . nutrition supplement (JUVEN)  1 packet Per Tube BID  . nystatin  5 mL Oral TID AC & HS  . pantoprazole sodium  40 mg Per Tube Q1200  . rifampin  600 mg Per Tube q1800  . sodium chloride flush  10-40 mL Intracatheter Q12H   Continuous Infusions: . feeding supplement (JEVITY 1.5 CAL/FIBER) 1,000 mL (12/26/19 2000)  . [START ON 12/28/2019] fluconazole (DIFLUCAN) IV    . meropenem (MERREM) IV 1 g (12/27/19 3220)  . vancomycin 1,000 mg (12/27/19 0645)    Principal Problem:   MRSA bacteremia Active Problems:   Anemia   Neutropenia (HCC)   Quadriplegia ([place])   Diabetes ([place])   E coli bacteremia   Pressure ulcer of left heel, stage 3 (HCC)   Edema of right upper arm   Lead-less pacemaker   Fungemia   LOS: 4 days   A & P  Sepsis: Neutropenia, AKI, Hypothermia, Hypoxia, MRSA Bacteremia, GNR bacteremia, pulmonary edema due to sepsis.  Bacteremia: MRSA and Klebsiella pneumonia and E. Coli. Infectious disease consulted. Echocardiogram has been performed and demonstrates no evidence of endocarditis, . The patient is receiving IV Vancomycin and meropenem. 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 This resulted in her quadriplegia. She has been at [facility] for a month. Possible sources include, osteomyelitis of C-spine, PICC, foley catheter, heel wound. Left heel to be  x-rayed to investigate osteomyelitis.TTE was unrevealing in terms of endocarditis. There is concern for the leadless pacemaker device that is seated in the ventricle wall. Rifampin has been added to coverage due to leadless pacemaker. Infectious disease has recommended IV vancomycin with rifampin daily for 6 weeks with resumption of suppressive cephalexin for contaminated hardware in the neck from previous surgery. Meropenem is to be continued for ESBL UTI. Last day for Meropenem will be 12/31/2019.  Neutropenia/Anemia: Severe. Likely due to Sepsis from bacteremia. Hematology consulted. The patient has been transfused with 2 units PRBC's. The plan is for bone marrow biopsy. I appreciate hematology/oncology. SPEP, Folate RBC pending, ANA negative, ESR elevated at 125, reticulocyte and DAT pending. WBC up to 1.2 today.   Acute on chronic respiratory failure, tracheostomy and vent dependence: PCCM consultated. CXR demonstrated scattered bilateral opacities secondary to developing pulmonary edema due to acute on chronic diastolic stage II exacerbation, or atypical infectious process. Atelectasis also present. She continues to have oxygen saturation with 40% O2 by mechanical ventilation. PASSY MUIR Valve.   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 PT/OT will be consulted to maintain range of motion.   Right upper extremity swelling: Doppler of the right upper extremity is negative for DVT. Swelling is likely due to PICC in this extremity. It is expected that this will be removed.   AKI: Creatinine is 1.28. It is unknown what the baseline creatinine is, however, Given her reduced muscle mass from quadriplegia, I would be surprised if it were greater than 1.0.  Diabetes: HbA1c pending. The patient's glucoses have run 80-100 over the past few hours without any insulin coverage, although she has been NPO. Tube feeds are to be restarted. Will continue the patient's NPH once tube feeds have been restarted.    Hypoalbuminemia: Pt was receiving Isosource 1.5 cal at 55 cc/hr prior to admission. Dietary has been consulted for continuation of tube feeds per PEG.  Abnormal TSH: Euthyroid Sick Syndrome  I have seen and examined this patient myself. I have spent 34 minutes in his evaluation and care.  Gabrielle Manago, DO [facility] Direct contact: see [email or web address]  7PM-7AM contact night coverage as above 12/27/2019, 12:46 PM  LOS: 1 day

## 2019-12-27 NOTE — Progress Notes (Signed)
PHARMACY NOTE:  ANTIMICROBIAL RENAL DOSAGE ADJUSTMENT  Current antimicrobial regimen includes a mismatch between antimicrobial dosage and estimated renal function.  As per policy approved by the Pharmacy & Therapeutics and Medical Executive Committees, the antimicrobial dosage will be adjusted accordingly.  Current antimicrobial dosage: Fluconazole 800 mg x 1 then 200 mg q 24 hours   Indication: Candidemia  Renal Function:  Estimated Creatinine Clearance: 53.5 mL/min (A) (by C-G formula based on SCr of 1.28 mg/dL (H)). []      On intermittent HD, scheduled: []      On CRRT    Antimicrobial dosage has been changed to:  Fluconazole 800 mg X 1 Then 400 mg every 24 hours  Additional comments: Would recommend higher dose of fluconazole with LFT monitoring as patient is on rifampin and has a bloodstream infection   Thank you for allowing pharmacy to be a part of this patient's care.  , PharmD, BCPS, BCIDP Infectious Diseases Clinical Pharmacist Phone: [phone] 12/27/2019 7:25 AM

## 2019-12-27 NOTE — Progress Notes (Signed)
PHARMACY - PHYSICIAN COMMUNICATION CRITICAL VALUE ALERT - BLOOD CULTURE IDENTIFICATION (BCID)  Gabrielle Wade is an 64 y.o. female who presented to [facility] on 12/23/2019  Assessment:  1/2 Blood Cx now with yeast on gram stain >> BCID candida parapsilosis  Name of physician (or Provider) Contacted: Bodenheimer NP  Current antibiotics:  Vancomycin > MRSA bacteremia Merrem > Klebsiella and ESBL Ecoli UTI Fluconazole PO > thrush   Changes to prescribed antibiotics recommended:  Recommend changing to Fluconazole IV given blood culture results. Recommend Fluconazole 800mg  IV x 1, then 200mg  IV q24h. Patient is borderline for renal dose adjustment but given quadriplegia and likelihood to overestimate CrCl will give lower fluconazole dose.   Results for orders placed or performed during the hospital encounter of 12/23/19  Blood Culture ID Panel (Reflexed) (Collected: 12/25/2019 10:26 AM)  Result Value Ref Range   Enterococcus species NOT DETECTED NOT DETECTED   Listeria monocytogenes NOT DETECTED NOT DETECTED   Staphylococcus species NOT DETECTED NOT DETECTED   Staphylococcus aureus (BCID) NOT DETECTED NOT DETECTED   Streptococcus species NOT DETECTED NOT DETECTED   Streptococcus agalactiae NOT DETECTED NOT DETECTED   Streptococcus pneumoniae NOT DETECTED NOT DETECTED   Streptococcus pyogenes NOT DETECTED NOT DETECTED   Acinetobacter baumannii NOT DETECTED NOT DETECTED   Enterobacteriaceae species NOT DETECTED NOT DETECTED   Enterobacter cloacae complex NOT DETECTED NOT DETECTED   Escherichia coli NOT DETECTED NOT DETECTED   Klebsiella oxytoca NOT DETECTED NOT DETECTED   Klebsiella pneumoniae NOT DETECTED NOT DETECTED   Proteus species NOT DETECTED NOT DETECTED   Serratia marcescens NOT DETECTED NOT DETECTED   Haemophilus influenzae NOT DETECTED NOT DETECTED   Neisseria meningitidis NOT DETECTED NOT DETECTED   Pseudomonas aeruginosa NOT DETECTED NOT DETECTED   Candida albicans NOT  DETECTED NOT DETECTED   Candida glabrata NOT DETECTED NOT DETECTED   Candida krusei NOT DETECTED NOT DETECTED   Candida parapsilosis DETECTED (A) NOT DETECTED   Candida tropicalis NOT DETECTED NOT DETECTED    Gabrielle Wade, 02/20/20 12/27/2019  12:04 AM

## 2019-12-28 ENCOUNTER — Inpatient Hospital Stay (HOSPITAL_COMMUNITY): Payer: Medicare PPO

## 2019-12-28 DIAGNOSIS — A499 Bacterial infection, unspecified: Secondary | ICD-10-CM

## 2019-12-28 DIAGNOSIS — G8251 Quadriplegia, C1-C4 complete: Secondary | ICD-10-CM

## 2019-12-28 DIAGNOSIS — Z430 Encounter for attention to tracheostomy: Secondary | ICD-10-CM

## 2019-12-28 DIAGNOSIS — B377 Candidal sepsis: Secondary | ICD-10-CM

## 2019-12-28 LAB — HEPATIC FUNCTION PANEL
ALT: 25 U/L (ref 0–44)
AST: 15 U/L (ref 15–41)
Albumin: 1.1 g/dL — ABNORMAL LOW (ref 3.5–5.0)
Alkaline Phosphatase: 185 U/L — ABNORMAL HIGH (ref 38–126)
Bilirubin, Direct: 0.2 mg/dL (ref 0.0–0.2)
Indirect Bilirubin: 0.8 mg/dL (ref 0.3–0.9)
Total Bilirubin: 1 mg/dL (ref 0.3–1.2)
Total Protein: 6.4 g/dL — ABNORMAL LOW (ref 6.5–8.1)

## 2019-12-28 LAB — CULTURE, BLOOD (ROUTINE X 2): Special Requests: ADEQUATE

## 2019-12-28 LAB — GLUCOSE, CAPILLARY
Glucose-Capillary: 107 mg/dL — ABNORMAL HIGH (ref 70–99)
Glucose-Capillary: 146 mg/dL — ABNORMAL HIGH (ref 70–99)
Glucose-Capillary: 220 mg/dL — ABNORMAL HIGH (ref 70–99)
Glucose-Capillary: 235 mg/dL — ABNORMAL HIGH (ref 70–99)
Glucose-Capillary: 243 mg/dL — ABNORMAL HIGH (ref 70–99)
Glucose-Capillary: 244 mg/dL — ABNORMAL HIGH (ref 70–99)

## 2019-12-28 LAB — BASIC METABOLIC PANEL
Anion gap: 4 — ABNORMAL LOW (ref 5–15)
BUN: 99 mg/dL — ABNORMAL HIGH (ref 8–23)
CO2: 32 mmol/L (ref 22–32)
Calcium: 8.5 mg/dL — ABNORMAL LOW (ref 8.9–10.3)
Chloride: 111 mmol/L (ref 98–111)
Creatinine, Ser: 1.22 mg/dL — ABNORMAL HIGH (ref 0.44–1.00)
GFR calc Af Amer: 54 mL/min — ABNORMAL LOW (ref >60)
GFR calc non Af Amer: 46 mL/min — ABNORMAL LOW (ref >60)
Glucose, Bld: 280 mg/dL — ABNORMAL HIGH (ref 70–99)
Potassium: 3.5 mmol/L (ref 3.5–5.1)
Sodium: 147 mmol/L — ABNORMAL HIGH (ref 135–145)

## 2019-12-28 LAB — CBC
HCT: 22.3 % — ABNORMAL LOW (ref 36.0–46.0)
Hemoglobin: 6.7 g/dL — CL (ref 12.0–15.0)
MCH: 29.8 pg (ref 26.0–34.0)
MCHC: 30 g/dL (ref 30.0–36.0)
MCV: 99.1 fL (ref 80.0–100.0)
Platelets: 128 10*3/uL — ABNORMAL LOW (ref 150–400)
RBC: 2.25 MIL/uL — ABNORMAL LOW (ref 3.87–5.11)
RDW: 18.1 % — ABNORMAL HIGH (ref 11.5–15.5)
WBC: 0.6 10*3/uL — CL (ref 4.0–10.5)
nRBC: 0 % (ref 0.0–0.2)

## 2019-12-28 LAB — HEMOGLOBIN AND HEMATOCRIT, BLOOD
HCT: 25.1 % — ABNORMAL LOW (ref 36.0–46.0)
Hemoglobin: 7.6 g/dL — ABNORMAL LOW (ref 12.0–15.0)

## 2019-12-28 LAB — PREPARE RBC (CROSSMATCH)

## 2019-12-28 MED ORDER — INSULIN ASPART 100 UNIT/ML ~~LOC~~ SOLN
5.0000 [IU] | SUBCUTANEOUS | Status: DC
  Administered 2019-12-28 – 2020-01-07 (×43): 5 [IU] via SUBCUTANEOUS

## 2019-12-28 MED ORDER — INSULIN ASPART 100 UNIT/ML ~~LOC~~ SOLN: 0.0000 [IU] | SUBCUTANEOUS | Status: DC

## 2019-12-28 MED ORDER — INSULIN GLARGINE 100 UNIT/ML ~~LOC~~ SOLN
10.0000 [IU] | Freq: Every day | SUBCUTANEOUS | Status: DC
  Administered 2019-12-28 – 2019-12-30 (×3): 10 [IU] via SUBCUTANEOUS
  Filled 2019-12-28 (×3): qty 0.1

## 2019-12-28 MED ORDER — INSULIN ASPART 100 UNIT/ML ~~LOC~~ SOLN
0.0000 [IU] | SUBCUTANEOUS | Status: DC
  Administered 2019-12-28 (×2): 2 [IU] via SUBCUTANEOUS
  Administered 2019-12-28: 5 [IU] via SUBCUTANEOUS
  Administered 2019-12-29 – 2019-12-30 (×7): 2 [IU] via SUBCUTANEOUS
  Administered 2019-12-31 (×2): 3 [IU] via SUBCUTANEOUS
  Administered 2019-12-31: 2 [IU] via SUBCUTANEOUS
  Administered 2020-01-01: 3 [IU] via SUBCUTANEOUS
  Administered 2020-01-01 – 2020-01-02 (×4): 2 [IU] via SUBCUTANEOUS
  Administered 2020-01-02: 3 [IU] via SUBCUTANEOUS
  Administered 2020-01-03 – 2020-01-06 (×8): 2 [IU] via SUBCUTANEOUS

## 2019-12-28 MED ORDER — SODIUM CHLORIDE 0.9% IV SOLUTION: Freq: Once | INTRAVENOUS | Status: AC

## 2019-12-28 MED ORDER — GERHARDT'S BUTT CREAM
1.0000 "application " | TOPICAL_CREAM | Freq: Four times a day (QID) | CUTANEOUS | Status: DC
  Administered 2019-12-28 – 2020-01-07 (×39): 1 via TOPICAL
  Filled 2019-12-28 (×3): qty 1

## 2019-12-28 MED ORDER — SODIUM CHLORIDE 0.9 % IV SOLN
INTRAVENOUS | Status: DC | PRN
  Administered 2019-12-28: 1000 mL via INTRAVENOUS
  Administered 2020-01-01: 250 mL via INTRAVENOUS

## 2019-12-28 NOTE — Progress Notes (Addendum)
NAME:  Gabrielle Wade, MRN:  [id number], DOB:  01-Sep-1956, LOS: 5 ADMISSION DATE:  12/23/2019, CONSULTATION DATE:  12/22/2018 REFERRING MD:  Dr. [name], CHIEF COMPLAINT:  Chronic vent management  Brief History    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 She is ventilator depended due to respiratory failure and was transferred from [facility] for evaluation of her leukopenia and anemia. PCCM was consulted by [facility] for chronic tracheostomy and ventilator management.   History of present illness   HPI obtained from medical chart review as patient is trach/ mechanical ventilator dependent.  No bedside medical records found, therefore limited HPI.   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  In ER, hemodynamically stable and initial temp 95.5.  Patient is awake in no distress on ventilator with settings PRVC, rate 12, FiO2 30%, TV 480, PEEP 5.  Unknown if patient weans. Workup notable for WBC 0.8, Hgb 6.2, platelets 255, glucose 177, BUN 69, sCr 1.24, alk phos 247, albumin 1.2, corrected calcium 10.8, trop hs 5, lactic acid 1, normal coags, TSH 24.57, free T4 0.66, FOB negative, SARS2 PCR neg, UA negative.  CXR showing cardiomegaly with moderate bilateral pleural effusions, bilateral airspace and bibasilar opacities concerning for developing edema vs atypical infectious process vs atelectasis vs developing infiltrate.  TRH to admit, PCCM consulted for vent and tracheostomy management.   Past Medical History  Cervical epidural abscess- MRSA in 08/2019 with resultant quadriplegia with tracheostomy and ventilator dependent respiratory  failure, PEG, DMT2, CKD stage III, COPD, GERD, anxiety/ depression, PTSD  Significant Hospital Events   1/4 Admit [facility]  Consults:  PCCM - trach / vent Infectious Disease   Procedures:  pta trach pta PEG   Significant Diagnostic Tests:  1/5 Echocardiogram: IMPRESSIONS:  1. Left ventricular ejection fraction, by visual estimation, is 55 to 60%. The left ventricle has normal function. There is no left ventricular hypertrophy.  2. Left ventricular diastolic parameters are consistent with Grade II diastolic dysfunction (pseudonormalization).  3. Elevated left ventricular end-diastolic pressure.  4. Global right ventricle has normal systolic function.The right ventricular size is normal. No increase in right ventricular wall thickness.  5. Left atrial size was normal.  6. Right atrial size was normal.  7. The mitral valve is normal in structure. Mild mitral valve regurgitation. No evidence of mitral stenosis.  8. The tricuspid valve is normal in structure.  9. The aortic valve was not well visualized. Aortic valve regurgitation is not visualized. No evidence of aortic valve sclerosis or stenosis. 10. The pulmonic valve was normal in structure. Pulmonic valve regurgitation is trivial. 11. Normal pulmonary artery systolic pressure. 12. The inferior vena cava is dilated in size with >50% respiratory variability, suggesting right atrial pressure of 8 mmHg. Micro Data:  1/4 SARS 2/ Flu A/B >> neg 1/4 UC >> >100K cfus of E coli and klebsiella pneumonia 1/4 BCx 2 >> preliminary staph species methicillin-resistant 1/4 urine strep >> (-) 1/4 urine legionella >> (-)  Antimicrobials:  1/4 cefepime >> 1/6 1/4 vanc >> 1/6 meropenem >>   Interim history/subjective:  No O/N Events, Patient remains on the vent/trach.  Soke with Patient's husband, and gave updates on her hypothermia  and transfusion.   Objective   Blood pressure (!) 123/49, pulse 76, temperature 97.8 F (36.6 C), temperature  source Oral, resp. rate 20, height 5' 10"  (1.778 m), weight 91.2 kg, SpO2 99 %.    Vent Mode: PRVC FiO2 (%):  [40 %] 40 % Set Rate:  [12 bmp] 12 bmp Vt Set:  [480 mL] 480 mL PEEP:  [5 cmH20] 5 cmH20 Plateau Pressure:  [21 cmH20-22 cmH20] 21 cmH20   Intake/Output Summary (Last 24 hours) at 12/28/2019 8921 Last data filed at 12/28/2019 0600 Gross per 24 hour  Intake 730.28 ml  Output 1560 ml  Net -829.72 ml    Examination: General: Currently on trach/ventilator, nodding and shaking her head to questions HEENT: Normocephalic, atraumatic, no scleral icterus present.    Cardio: RRR, no murmurs, rubs, or gallops Pulmonary:Diminished breath sounds appreciated bilaterally, no wheezes, rales, or rhonchi appreciated.   Abdomen: BS present, nontender to palpation, soft.  MSK: trace pitting edema appreciated in the lower limbs bilaterally.  Neuro: Shakes and nods head to questions.  Skin: dry and warm.   Resolved Hospital Problem list     Assessment & Plan:   Chronic Respiratory Failure with Tracheostomy and PEG 2/2 to Cervical Epidural Abscess Resulting in Quadriplegia Requiring Ventilation Support:   - Full vent support, no weaning - Current vent settings 40%FiO2/5PEEP/12RR/484m - Titrate O2 for sat of 88-92% - Maintain current trach size and type  Bacteremia:  - Polymicrobial bacteremia with MRSA, E. Coli, and Candida parasilopsis.  - Continuing Vancomycin, meropenem, and fluconazole.   HCAP +/- Pulmonary Edema:  - Pulmonary Blood Cultures with MRSA - Urine Cultures + E. Coli and Klebsiella - On Vancomycin and Meropenem - Will continue to monitor pleural effusions  Caloric Malnutrition:  - Consulting Nutrition for TF Best practice:  Diet: NPO, PEG tube feeds resumed Pain/Anxiety/Delirium protocol (if indicated): not needed VAP protocol (if indicated): yes DVT prophylaxis: SCDs GI prophylaxis: PPI Glucose control: CBG q 4, SSI Mobility: PT/ OT Code Status: full  Family  Communication: per primary, patient updated on plan of care Disposition: admit [facility]/ PCCM for chronic vent managment  Labs   CBC: Recent Labs  Lab 12/23/19 1604 12/23/19 2138 12/24/19 0232 12/24/19 0233 12/25/19 0129 12/26/19 0251 12/27/19 0335  WBC 0.8*  --  0.7*  --  0.8* 0.9* 1.2*  NEUTROABS 0.0*  --   --   --  0.0* 0.0*  --   HGB 6.2* 7.8* 7.4*  --  7.1* 7.0* 7.2*  HCT 20.9* 23.0* 24.2* 21.6* 23.5* 23.5* 24.5*  MCV 100.0  --  98.8  --  97.1 97.5 100.4*  PLT 255  --  245  --  207 209 1194   Basic Metabolic Panel: Recent Labs  Lab 12/23/19 1604 12/23/19 2138 12/24/19 0232 12/25/19 0129 12/26/19 0251 12/27/19 0335  NA 142 144 141 143 145 147*  K 4.6 4.9 4.8 4.4 4.0 4.0  CL 107  --  106 107 109 111  CO2 29  --  28 26 32 29  GLUCOSE 177*  --  108* 103* 169* 193*  BUN 69*  --  70* 76* 82* 95*  CREATININE 1.24*  --  1.23* 1.39* 1.38* 1.28*  CALCIUM 8.6*  --  8.5* 8.3* 8.3* 8.4*   GFR: Estimated Creatinine Clearance: 53.7 mL/min (A) (by C-G formula based on SCr of 1.28 mg/dL (H)). Recent Labs  Lab 12/23/19 1610 12/24/19 0232 12/25/19 0129 12/26/19 0251 12/27/19 0335  PROCALCITON  --  0.32 0.32  --   --   WBC  --  0.7* 0.8* 0.9* 1.2*  LATICACIDVEN 1.0  --   --   --   --     Liver Function Tests: Recent Labs  Lab 12/23/19 1604 12/24/19 0232 12/25/19 0129  AST 16 17 14*  ALT 40 38 33  ALKPHOS 247* 206* 175*  BILITOT 0.6 0.7 0.7  PROT 6.8 6.9 6.6  ALBUMIN 1.2* 1.3* 1.3*   No results for input(s): LIPASE, AMYLASE in the last 168 hours. No results for input(s): AMMONIA in the last 168 hours.  ABG    Component Value Date/Time   PHART 7.455 (H) 12/23/2019 2138   PCO2ART 43.6 12/23/2019 2138   PO2ART 80.0 (L) 12/23/2019 2138   HCO3 30.7 (H) 12/23/2019 2138   TCO2 32 12/23/2019 2138   O2SAT 96.0 12/23/2019 2138     Coagulation Profile: Recent Labs  Lab 12/23/19 1623  INR 1.2    Cardiac Enzymes: No results for input(s): CKTOTAL, CKMB,  CKMBINDEX, TROPONINI in the last 168 hours.  HbA1C: No results found for: HGBA1C  CBG: Recent Labs  Lab 12/27/19 1208 12/27/19 1614 12/27/19 2017 12/27/19 2355 12/28/19 0332  GLUCAP 183* 187* 242* 244* 235*   [name], MD IMTS, PGY-1 Pager: [phone]  Please see Attending A/P and/or Addendum for final recommendations:

## 2019-12-28 NOTE — Progress Notes (Signed)
PROGRESS NOTE  Gabrielle Wade MRN:[id number] DOB: 09/10/1956 DOA: 12/23/2019 PCP: Van Eyk, Jason, MD  Brief History   Gabrielle Wade  is a 63 y.o. female, w anxiety/ depression,PTSD,  hx of epidural abscess (mrsa), w quadriplegia , Dm2, CKD stage3,  Copd, Chronic Trach/ vent dependence, Gerd, chronic dysphagia, Anemia, presents with  Hgb 6.8, and wbc 0.6 per [facility].    Pt notes slight dyspnea and dry cough, but unable to tell me how long this has been going on . Pt denies fever ,chills, cp, palp, n/v, diarrhea, brbpr, black stool.    In the ED the patient was found to have Temp of 95.5 and SaO2 of 96% on 30% FIO2. CXR demonstrates scattered bilateral airspace opacities, atelectasis, and cardiomegaly with volume overload. WBC was 0.8, Hgb 6.2, and creatinine of 1.24 with Alk phos 247. FOBT was negative. Blood cultures x 2 were obtained. They have grown out MRSA from one bottle and GNR from the other. ID has been consulted as has hematology/oncology. PCCM has been consulted for management of the patient's trach and vent.  The patient is receiving IV Meropenem and Vancomycin. She has been diuresed with Lasix 20 mg IV x 1.  Tube feeds have been restarted.  Echocardiogram has been performed. It has demonsdtrated an EF of 50-55% and Grade II diastolic dysfunction.  Blood cultures (1/2) drawn on 12/25/2019 have grown out Candida parapsilosis. The patient has been started on diflucan   She was transfused with one unit PRBC's on 12/28/2019 due to hemoglobin of 6.7.   Consultants  . PCCM . Infectious disease . Hematology/Oncology . Interventional radiology  Procedures  . None  Antibiotics   Anti-infectives (From admission, onward)   Start     Dose/Rate Route Frequency Ordered Stop   12/28/19 0800  fluconazole (DIFLUCAN) IVPB 200 mg  Status:  Discontinued     200 mg 100 mL/hr over 60 Minutes Intravenous Every 24 hours 12/27/19 0012 12/27/19 0724   12/28/19 0800  fluconazole (DIFLUCAN) IVPB 400  mg     400 mg 100 mL/hr over 120 Minutes Intravenous Every 24 hours 12/27/19 0724     12/27/19 0609  vancomycin (VANCOCIN) IVPB 1000 mg/200 mL premix     1,000 mg 200 mL/hr over 60 Minutes Intravenous Every 24 hours 12/26/19 0742     12/27/19 0230  fluconazole (DIFLUCAN) IVPB 400 mg     400 mg 100 mL/hr over 120 Minutes Intravenous Every 1 hr x 2 12/27/19 0222 12/27/19 0714   12/27/19 0100  fluconazole (DIFLUCAN) IVPB 800 mg  Status:  Discontinued     800 mg 200 mL/hr over 120 Minutes Intravenous  Once 12/27/19 0012 12/27/19 0222   12/26/19 2200  meropenem (MERREM) 1 g in sodium chloride 0.9 % 100 mL IVPB     1 g 200 mL/hr over 30 Minutes Intravenous Every 12 hours 12/26/19 1337     12/26/19 1800  rifampin (RIFADIN) 60 mg/mL oral suspension 600 mg     600 mg Per Tube Daily-1800 12/25/19 2207     12/26/19 1000  fluconazole (DIFLUCAN) tablet 200 mg  Status:  Discontinued     200 mg Per Tube Daily 12/26/19 0514 12/27/19 0012   12/26/19 0600  vancomycin (VANCOCIN) IVPB 1000 mg/200 mL premix  Status:  Discontinued     1,000 mg 200 mL/hr over 60 Minutes Intravenous Every 48 hours 12/24/19 0512 12/26/19 0742   12/25/19 1900  meropenem (MERREM) 1 g in sodium chloride 0.9 % 100 mL IVPB    Status:  Discontinued     1 g 200 mL/hr over 30 Minutes Intravenous Every 8 hours 12/25/19 1838 12/26/19 1337   12/25/19 1630  rifampin (RIFADIN) 60 mg/mL oral suspension 600 mg  Status:  Discontinued     600 mg Per Tube Daily 12/25/19 1553 12/25/19 2207   12/24/19 2250  meropenem (MERREM) 1 g in sodium chloride 0.9 % 100 mL IVPB  Status:  Discontinued     1 g 200 mL/hr over 30 Minutes Intravenous Every 8 hours 12/24/19 1942 12/25/19 1838   12/24/19 1815  fluconazole (DIFLUCAN) 40 MG/ML suspension 200 mg  Status:  Discontinued     200 mg Per Tube Daily 12/24/19 1804 12/26/19 0514   12/24/19 1445  fluconazole (DIFLUCAN) 40 MG/ML suspension 200 mg  Status:  Discontinued     200 mg Per Tube Daily 12/24/19 1436  12/24/19 1804   12/24/19 1000  ceFEPIme (MAXIPIME) 2 g in sodium chloride 0.9 % 100 mL IVPB  Status:  Discontinued     2 g 200 mL/hr over 30 Minutes Intravenous Every 12 hours 12/24/19 0512 12/24/19 0859   12/24/19 1000  meropenem (MERREM) 1 g in sodium chloride 0.9 % 100 mL IVPB  Status:  Discontinued     1 g 200 mL/hr over 30 Minutes Intravenous Every 12 hours 12/24/19 0859 12/24/19 1942   12/24/19 0315  vancomycin (VANCOREADY) IVPB 1250 mg/250 mL     1,250 mg 166.7 mL/hr over 90 Minutes Intravenous  Once 12/24/19 0250 12/24/19 0819   12/23/19 2045  ceFEPIme (MAXIPIME) 2 g in sodium chloride 0.9 % 100 mL IVPB     2 g 200 mL/hr over 30 Minutes Intravenous  Once 12/23/19 2030 12/23/19 2201      Interval History/Subjective  The patient is resting comfortably. No new complaints.  Objective   Vitals:  Vitals:   12/28/19 1137 12/28/19 1200  BP: (!) 143/88 (!) 180/68  Pulse: 77 78  Resp: (!) 21 17  Temp: (!) 94.4 F (34.7 C)   SpO2: 98% 98%    Exam:  Constitutional:  . The patient is awake, alert, and oriented x 3. No acute distress. Respiratory:  . Positive for increased work of breathing. . Diminished breath sounds bilateral . Mild bilateral rales . No wheezes or rhonchi are appreciated. . No tactile fremitus Cardiovascular:  . Regular rate and rhythm . No murmurs, ectopy, or gallups. . No lateral PMI. No thrills. Abdomen:  . Abdomen is soft, non-tender, non-distended . No hernias, masses, or organomegaly . Normoactive bowel sounds.  Musculoskeletal:  . No cyanosis, clubbing, or edema Skin:  . No rashes, lesions, ulcers . palpation of skin: no induration or nodules Neurologic:  . CN 2-12 intact . Pt is a quadriplegic Psychiatric:  . Mental status o Mood, affect appropriate o Orientation to person, place, time  . judgment and insight appear intact  I have personally reviewed the following:   Today's Data  . Vitals, BMP, CBC  Micro Data  . Blood  Cultures: 1 positive for Klebsiella pneumoniae, 1 positive for E. Coli, MRSA positive . 1 positive for candida paraplopsis  Cardiology Data  . Echo performed, EF 55-60%. Grade II diastolic dysfunction. Normal Global RV systolic function.   Scheduled Meds: . sodium chloride   Intravenous Once  . sodium chloride   Intravenous Once  . chlorhexidine gluconate (MEDLINE KIT)  15 mL Mouth Rinse BID  . Chlorhexidine Gluconate Cloth  6 each Topical Daily  . feeding supplement (PRO-STAT   SUGAR FREE 64)  30 mL Per Tube TID  . ferrous sulfate  300 mg Per Tube Q breakfast  . insulin aspart  0-15 Units Subcutaneous Q4H  . insulin aspart  5 Units Subcutaneous Q4H  . insulin glargine  10 Units Subcutaneous Daily  . ipratropium-albuterol  3 mL Nebulization TID  . mouth rinse  15 mL Mouth Rinse 10 times per day  . mupirocin ointment  1 application Nasal BID  . nutrition supplement (JUVEN)  1 packet Per Tube BID  . nystatin  5 mL Oral TID AC & HS  . pantoprazole sodium  40 mg Per Tube Q1200  . rifampin  600 mg Per Tube q1800  . sodium chloride flush  10-40 mL Intracatheter Q12H   Continuous Infusions: . feeding supplement (JEVITY 1.5 CAL/FIBER) 1,000 mL (12/27/19 2036)  . fluconazole (DIFLUCAN) IV Stopped (12/28/19 1039)  . meropenem (MERREM) IV Stopped (12/28/19 1121)  . vancomycin Stopped (12/28/19 0650)    Principal Problem:   MRSA bacteremia Active Problems:   Anemia   Neutropenia (HCC)   Quadriplegia (HCC)   Diabetes (HCC)   E coli bacteremia   Pressure ulcer of left heel, stage 3 (HCC)   Edema of right upper arm   Lead-less pacemaker   Fungemia   LOS: 5 days   A & P  Sepsis: Neutropenia, AKI, Hypothermia, Hypoxia, MRSA Bacteremia, GNR bacteremia, pulmonary edema due to sepsis.  Bacteremia: MRSA and Klebsiella pneumonia and E. Coli. Infectious disease consulted. Echocardiogram has been performed and demonstrates no evidence of endocarditis, . The patient is receiving IV Vancomycin  and meropenem. 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 This resulted in her quadriplegia. She has been at [facility] for a month. Possible sources include, osteomyelitis of C-spine, PICC, foley catheter, heel wound. Left heel to be x-rayed to investigate osteomyelitis.TTE was unrevealing in terms of endocarditis. There is concern for the leadless pacemaker device that is seated in the ventricle wall. Rifampin has been added to coverage due to leadless pacemaker. Infectious disease has recommended IV vancomycin with rifampin daily for 6 weeks with resumption of suppressive cephalexin for contaminated hardware in the neck from previous surgery. Meropenem is to be continued for ESBL UTI. Last day for Meropenem will be 12/31/2019. On 12/27/2019 a blood culture grew out candida paraplopsis for which the patient was started on IV diflucan.  Neutropenia/Anemia: Severe. Likely due to Sepsis from bacteremia. Hematology consulted. The patient has been transfused with 2 units PRBC's. The plan is for bone marrow biopsy. I appreciate hematology/oncology. SPEP, Folate RBC pending, ANA negative, ESR elevated at 125, reticulocyte and DAT pending. WBC 0.6 today. Hgb 6.7. She will receive on e unit of PRBC's in transfusion.  Acute on chronic respiratory failure, tracheostomy and vent dependence: PCCM consultated. CXR demonstrated scattered bilateral opacities secondary to developing pulmonary edema due to acute on chronic diastolic stage II exacerbation, or atypical infectious process. Atelectasis also present. She continues to have oxygen saturation with 40% O2 by mechanical ventilation. PASSY MUIR Valve.   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 PT/OT will be consulted to maintain range of motion.   Right upper extremity swelling: Doppler of the right upper extremity is negative for DVT. Swelling is likely due to PICC in this extremity. It is  expected that this will be removed.   AKI: Creatinine is 1.28. It is unknown what the baseline creatinine is, however, Given her reduced muscle   mass from quadriplegia, I would be surprised if it were greater than 1.0.  Diabetes: HbA1c pending. The patient's glucoses have run 220-244 over the past 24 hours. She is receiving Lanuts 10 units daily with correction insulin and prandial insulin. She is now on tube feeds.  Hypoalbuminemia: Pt was receiving Isosource 1.5 cal at 55 cc/hr prior to admission. Dietary has been consulted for continuation of tube feeds per PEG.  Abnormal TSH: Euthyroid Sick Syndrome  I have seen and examined this patient myself. I have spent 32 minutes in his evaluation and care.  Ava Swayze, DO [facility] Direct contact: see [email or web address]  7PM-7AM contact night coverage as above 12/28/2019, 1:20 PM  LOS: 1 day          

## 2019-12-28 NOTE — Progress Notes (Signed)
1mg  Ativan concentrated solution wasted with , RN.

## 2019-12-28 NOTE — Progress Notes (Signed)
CRITICAL VALUE ALERT  Critical Value:  Hb: 6.7    WBC: 0.6  Date & Time Notied:  12/28/2019 at 0744  Provider Notified: Dr. Gerri Lins at [phone]  Orders Received/Actions taken: Provider to write orders for blood transfusion.

## 2019-12-28 NOTE — Consult Note (Signed)
WOC Nurse Consult Note: Reason for Consult: Pressure Injury left heel, feel thickness, Stage 3. Also documented Moisture Associated Skin Damage (MASD) with likely fungal overgrowth to the medial and posterior thighs, perineum and buttocks Wound type:Pressure, MASD plus (fungal) infection Pressure Injury POA: Yes Measurement:left heel: Stage 3 nonhealing pressure injury: 3cm x 7cm x 0.1cm Wound bed:90% re/pink, 10% yellow Drainage (amount, consistency, odor) small amount serous to light yellow Periwound: intact Dressing procedure/placement/frequency: Patient is on a mattress replacement with low air loss feature and this will assist not only in the redistribution of pressure but microclimate management. She is receiving systemic antifungal, but a topical antifungal is provided (Gerhart's Butt Cream, a compounded 1:1:1 cream with zinc oxide, hydrocortisone cream and lotrimin). We will float the heels with Prevalon pressure redistribution heel boots and topical care will be with xeroform gauze as a wound contact layer topped with a silicone foam dressing and changed daily. Turning and repositioning is in place.  I am assisted in the assessment of this patient by Bedside RN, Vernona Rieger.  She and I have reviewed the integumentary treatment plan of care and her expertise and assistance is appreciated.  WOC nursing team will not follow, but will remain available to this patient, the nursing and medical teams.  Please re-consult if needed. Thanks, Ladona Mow, MSN, RN, GNP, [facility] [place]  Pager# [phone]

## 2019-12-29 LAB — CBC
HCT: 24.2 % — ABNORMAL LOW (ref 36.0–46.0)
Hemoglobin: 7.4 g/dL — ABNORMAL LOW (ref 12.0–15.0)
MCH: 29.6 pg (ref 26.0–34.0)
MCHC: 30.6 g/dL (ref 30.0–36.0)
MCV: 96.8 fL (ref 80.0–100.0)
Platelets: 148 10*3/uL — ABNORMAL LOW (ref 150–400)
RBC: 2.5 MIL/uL — ABNORMAL LOW (ref 3.87–5.11)
RDW: 19.4 % — ABNORMAL HIGH (ref 11.5–15.5)
WBC: 1.8 10*3/uL — ABNORMAL LOW (ref 4.0–10.5)
nRBC: 0 % (ref 0.0–0.2)

## 2019-12-29 LAB — BASIC METABOLIC PANEL
Anion gap: 7 (ref 5–15)
BUN: 104 mg/dL — ABNORMAL HIGH (ref 8–23)
CO2: 31 mmol/L (ref 22–32)
Calcium: 8.7 mg/dL — ABNORMAL LOW (ref 8.9–10.3)
Chloride: 111 mmol/L (ref 98–111)
Creatinine, Ser: 0.97 mg/dL (ref 0.44–1.00)
GFR calc Af Amer: >60 mL/min (ref >60)
GFR calc non Af Amer: >60 mL/min (ref >60)
Glucose, Bld: 132 mg/dL — ABNORMAL HIGH (ref 70–99)
Potassium: 4 mmol/L (ref 3.5–5.1)
Sodium: 149 mmol/L — ABNORMAL HIGH (ref 135–145)

## 2019-12-29 LAB — GLUCOSE, CAPILLARY
Glucose-Capillary: 112 mg/dL — ABNORMAL HIGH (ref 70–99)
Glucose-Capillary: 130 mg/dL — ABNORMAL HIGH (ref 70–99)
Glucose-Capillary: 136 mg/dL — ABNORMAL HIGH (ref 70–99)
Glucose-Capillary: 143 mg/dL — ABNORMAL HIGH (ref 70–99)
Glucose-Capillary: 147 mg/dL — ABNORMAL HIGH (ref 70–99)
Glucose-Capillary: 97 mg/dL (ref 70–99)

## 2019-12-29 LAB — CULTURE, BLOOD (ROUTINE X 2): Special Requests: ADEQUATE

## 2019-12-29 LAB — VANCOMYCIN, PEAK: Vancomycin Pk: 42 ug/mL — ABNORMAL HIGH (ref 30–40)

## 2019-12-29 MED ORDER — OXYCODONE HCL 5 MG PO TABS
5.0000 mg | ORAL_TABLET | ORAL | Status: DC | PRN
  Administered 2019-12-29 – 2020-01-07 (×22): 5 mg via ORAL
  Filled 2019-12-29 (×22): qty 1

## 2019-12-29 MED ORDER — WHITE PETROLATUM EX OINT
TOPICAL_OINTMENT | CUTANEOUS | Status: AC
  Administered 2019-12-29: 0.2
  Filled 2019-12-29: qty 28.35

## 2019-12-29 NOTE — Progress Notes (Signed)
eLink Physician-Brief Progress Note Patient Name: Gabrielle Wade DOB: [birth year] MRN: [id number]   Date of Service  12/29/2019  HPI/Events of Note  Patient takes percocet at home Q4H. Requests it be ordered here.  eICU Interventions  Ordered oxycodone 5mg  Q4H PRN mod-severe pain. Tylenol ordered separately already.     Intervention Category Intermediate Interventions: Pain - evaluation and management  Gabrielle Wade 12/29/2019, 1:28 AM

## 2019-12-29 NOTE — Progress Notes (Signed)
NAME:  Gabrielle Wade, MRN:  [id number], DOB:  January 16, 1956, LOS: 6 ADMISSION DATE:  12/23/2019, CONSULTATION DATE:  12/22/2018 REFERRING MD:  Dr. 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 She is ventilator depended due to respiratory failure and was transferred from [facility] for evaluation of her leukopenia and anemia. PCCM was consulted by [facility] for chronic tracheostomy and ventilator management.   History of present illness   HPI obtained from medical chart review as patient is trach/ mechanical ventilator dependent.  No bedside medical records found, therefore limited HPI.   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  In ER, hemodynamically stable and initial temp 95.5.  Patient is awake in no distress on ventilator with settings PRVC, rate 12, FiO2 30%, TV 480, PEEP 5.  Unknown if patient weans.  Workup notable for WBC 0.8, Hgb 6.2, platelets 255, glucose 177, BUN 69, sCr 1.24, alk phos 247, albumin 1.2, corrected calcium 10.8, trop hs 5, lactic acid 1, normal coags, TSH 24.57, free T4 0.66, FOB negative, SARS2 PCR neg, UA negative.  CXR showing cardiomegaly with moderate bilateral pleural effusions, bilateral airspace and bibasilar opacities concerning for developing edema vs atypical infectious process vs atelectasis vs developing infiltrate.  TRH to admit, PCCM consulted for vent and tracheostomy management.   Past Medical History  Cervical epidural abscess- MRSA in 08/2019 with resultant quadriplegia with tracheostomy and ventilator dependent respiratory failure,  PEG, DMT2, CKD stage III, COPD, GERD, anxiety/ depression, PTSD  Significant Hospital Events   1/4 Admit [facility]  Consults:  PCCM - trach / vent   Procedures:  pta trach pta PEG   Significant Diagnostic Tests:   Micro Data:  1/4 SARS 2/ Flu A/B >> neg 1/4 UC >> >100K cfus of E coli and klebsiella pneumonia 1/4 BCx 2 >> preliminary staph species methicillin-resistant 1/4 urine strep >> (-) 1/4 urine legionella >> (-)  Antimicrobials:  1/4 cefepime >> 1/6 1/4 vanc >> 1/6 meropenem >>   Interim history/subjective:  No O/N Events, Patient remains on the vent/trach.  Bit more alert today, able to mouth some words.  Objective   Blood pressure (!) 123/109, pulse 80, temperature 97.6 F (36.4 C), temperature source Oral, resp. rate (!) 23, height 5' 10"  (1.778 m), weight 91.1 kg, SpO2 100 %.    Vent Mode: PRVC FiO2 (%):  [30 %] 30 % Set Rate:  [12 bmp] 12 bmp Vt Set:  [480 mL] 480 mL PEEP:  [5 cmH20] 5 cmH20 Plateau Pressure:  [13 cmH20-23 cmH20] 13 cmH20   Intake/Output Summary (Last 24 hours) at 12/29/2019 1348 Last data filed at 12/29/2019 1100 Gross per 24 hour  Intake 1363.98 ml  Output 1625 ml  Net -261.02 ml    Examination: General: Intubated, non-diaphoretic  HEENT: 6.0 Shiley trach to vent Cardio: RRR, no murmurs, rubs, gallops Pulmonary: No increased work of breathing, no wheezes Abdomen: BS normal, no distension, nontender to palpation  MSK: No pitting edema noted in BLE Neuro: Awake, alert, follows commands Skin: dry and warm   Resolved Hospital Problem list     Assessment & Plan:   Chronic  Respiratory Failure with Tracheostomy and PEG 2/2 to Cervical Epidural Abscess Resulting in Quadriplegia Requiring Ventilation Support:   - Full vent support, no weaning - Current vent settings 40/5/12/480 - Titrate O2 for sat of 88-92% - Maintain current trach size and type  HCAP:  - Blood Cultures with MRSA - Urine Cultures + E. Coli and Klebsiella - On  Vancomycin and Meropenem  Candidemia - on fluconazole per primary team  Caloric Malnutrition:  -Continue tube feeds  Dispo Per primary team.  Lenice Llamas, MD Pulmonary and [place] Pager: [phone] Office:[phone]   Best practice:  Diet: NPO, PEG tube feeds resumed Pain/Anxiety/Delirium protocol (if indicated): not needed VAP protocol (if indicated): yes DVT prophylaxis: SCDs GI prophylaxis: PPI Glucose control: CBG q 4, SSI Mobility: PT/ OT Code Status: full  Family Communication: per primary, patient updated on plan of care Disposition: admit TRH/ PCCM for chronic vent managment  Labs   CBC: Recent Labs  Lab 12/23/19 1604 12/23/19 2138 12/25/19 0129 12/26/19 0251 12/27/19 0335 12/28/19 0981 12/28/19 2035 12/29/19 0405  WBC 0.8*   < > 0.8* 0.9* 1.2* 0.6*  --  1.8*  NEUTROABS 0.0*  --  0.0* 0.0*  --   --   --   --   HGB 6.2*  --  7.1* 7.0* 7.2* 6.7* 7.6* 7.4*  HCT 20.9*   < > 23.5* 23.5* 24.5* 22.3* 25.1* 24.2*  MCV 100.0   < > 97.1 97.5 100.4* 99.1  --  96.8  PLT 255   < > 207 209 185 128*  --  148*   < > = values in this interval not displayed.    Basic Metabolic Panel: Recent Labs  Lab 12/25/19 0129 12/26/19 0251 12/27/19 0335 12/28/19 0638 12/29/19 0405  NA 143 145 147* 147* 149*  K 4.4 4.0 4.0 3.5 4.0  CL 107 109 111 111 111  CO2 26 32 29 32 31  GLUCOSE 103* 169* 193* 280* 132*  BUN 76* 82* 95* 99* 104*  CREATININE 1.39* 1.38* 1.28* 1.22* 0.97  CALCIUM 8.3* 8.3* 8.4* 8.5* 8.7*   GFR: Estimated Creatinine Clearance: 70.7 mL/min (by C-G formula based on SCr of 0.97 mg/dL). Recent Labs  Lab 12/23/19 1610 12/24/19 0232 12/25/19 0129 12/26/19 0251 12/27/19 0335 12/28/19 0638 12/29/19 0405  PROCALCITON  --  0.32 0.32  --   --   --   --   WBC  --  0.7* 0.8* 0.9* 1.2* 0.6* 1.8*  LATICACIDVEN 1.0  --   --   --   --   --   --     Liver Function Tests: Recent Labs  Lab 12/23/19 1604 12/24/19 0232  12/25/19 0129 12/28/19 0638  AST 16 17 14* 15  ALT 40 38 33 25  ALKPHOS 247* 206* 175* 185*  BILITOT 0.6 0.7 0.7 1.0  PROT 6.8 6.9 6.6 6.4*  ALBUMIN 1.2* 1.3* 1.3* 1.1*   No results for input(s): LIPASE, AMYLASE in the last 168 hours. No results for input(s): AMMONIA in the last 168 hours.  ABG    Component Value Date/Time   PHART 7.455 (H) 12/23/2019 2138   PCO2ART 43.6 12/23/2019 2138   PO2ART 80.0 (L) 12/23/2019 2138   HCO3 30.7 (H) 12/23/2019 2138   TCO2 32 12/23/2019 2138   O2SAT 96.0 12/23/2019 2138     Coagulation Profile: Recent Labs  Lab 12/23/19 1623  INR 1.2    Cardiac Enzymes: No results for input(s): CKTOTAL, CKMB, CKMBINDEX, TROPONINI  in the last 168 hours.  HbA1C: No results found for: HGBA1C  CBG: Recent Labs  Lab 12/28/19 1956 12/29/19 0011 12/29/19 0451 12/29/19 0751 12/29/19 1137  GLUCAP 107* 147* 143* 97 130*

## 2019-12-29 NOTE — Progress Notes (Signed)
PROGRESS NOTE  Gabrielle Wade MRN:[id number] DOB: 02/17/1956 DOA: 12/23/2019 PCP: Van Eyk, Jason, MD  Brief History   Gabrielle Wade  is a 63 y.o. female, w anxiety/ depression,PTSD,  hx of epidural abscess (mrsa), w quadriplegia , Dm2, CKD stage3,  Copd, Chronic Trach/ vent dependence, Gerd, chronic dysphagia, Anemia, presents with  Hgb 6.8, and wbc 0.6 per [facility].    Pt notes slight dyspnea and dry cough, but unable to tell me how long this has been going on . Pt denies fever ,chills, cp, palp, n/v, diarrhea, brbpr, black stool.    In the ED the patient was found to have Temp of 95.5 and SaO2 of 96% on 30% FIO2. CXR demonstrates scattered bilateral airspace opacities, atelectasis, and cardiomegaly with volume overload. WBC was 0.8, Hgb 6.2, and creatinine of 1.24 with Alk phos 247. FOBT was negative. Blood cultures x 2 were obtained. They have grown out MRSA from one bottle and GNR from the other. ID has been consulted as has hematology/oncology. PCCM has been consulted for management of the patient's trach and vent.  The patient is receiving IV Meropenem and Vancomycin. She has been diuresed with Lasix 20 mg IV x 1.  Tube feeds have been restarted.  Echocardiogram has been performed. It has demonsdtrated an EF of 50-55% and Grade II diastolic dysfunction.  Blood cultures (1/2) drawn on 12/25/2019 have grown out Candida parapsilosis. The patient has been started on diflucan   She was transfused with one unit PRBC's on 12/28/2019 due to hemoglobin of 6.7.   Consultants  . PCCM . Infectious disease . Hematology/Oncology . Interventional radiology  Procedures  . None  Antibiotics   Anti-infectives (From admission, onward)   Start     Dose/Rate Route Frequency Ordered Stop   12/28/19 0800  fluconazole (DIFLUCAN) IVPB 200 mg  Status:  Discontinued     200 mg 100 mL/hr over 60 Minutes Intravenous Every 24 hours 12/27/19 0012 12/27/19 0724   12/28/19 0800  fluconazole (DIFLUCAN) IVPB 400  mg     400 mg 100 mL/hr over 120 Minutes Intravenous Every 24 hours 12/27/19 0724     12/27/19 0609  vancomycin (VANCOCIN) IVPB 1000 mg/200 mL premix     1,000 mg 200 mL/hr over 60 Minutes Intravenous Every 24 hours 12/26/19 0742     12/27/19 0230  fluconazole (DIFLUCAN) IVPB 400 mg     400 mg 100 mL/hr over 120 Minutes Intravenous Every 1 hr x 2 12/27/19 0222 12/27/19 0714   12/27/19 0100  fluconazole (DIFLUCAN) IVPB 800 mg  Status:  Discontinued     800 mg 200 mL/hr over 120 Minutes Intravenous  Once 12/27/19 0012 12/27/19 0222   12/26/19 2200  meropenem (MERREM) 1 g in sodium chloride 0.9 % 100 mL IVPB     1 g 200 mL/hr over 30 Minutes Intravenous Every 12 hours 12/26/19 1337     12/26/19 1800  rifampin (RIFADIN) 60 mg/mL oral suspension 600 mg     600 mg Per Tube Daily-1800 12/25/19 2207     12/26/19 1000  fluconazole (DIFLUCAN) tablet 200 mg  Status:  Discontinued     200 mg Per Tube Daily 12/26/19 0514 12/27/19 0012   12/26/19 0600  vancomycin (VANCOCIN) IVPB 1000 mg/200 mL premix  Status:  Discontinued     1,000 mg 200 mL/hr over 60 Minutes Intravenous Every 48 hours 12/24/19 0512 12/26/19 0742   12/25/19 1900  meropenem (MERREM) 1 g in sodium chloride 0.9 % 100 mL IVPB    Status:  Discontinued     1 g 200 mL/hr over 30 Minutes Intravenous Every 8 hours 12/25/19 1838 12/26/19 1337   12/25/19 1630  rifampin (RIFADIN) 60 mg/mL oral suspension 600 mg  Status:  Discontinued     600 mg Per Tube Daily 12/25/19 1553 12/25/19 2207   12/24/19 2250  meropenem (MERREM) 1 g in sodium chloride 0.9 % 100 mL IVPB  Status:  Discontinued     1 g 200 mL/hr over 30 Minutes Intravenous Every 8 hours 12/24/19 1942 12/25/19 1838   12/24/19 1815  fluconazole (DIFLUCAN) 40 MG/ML suspension 200 mg  Status:  Discontinued     200 mg Per Tube Daily 12/24/19 1804 12/26/19 0514   12/24/19 1445  fluconazole (DIFLUCAN) 40 MG/ML suspension 200 mg  Status:  Discontinued     200 mg Per Tube Daily 12/24/19 1436  12/24/19 1804   12/24/19 1000  ceFEPIme (MAXIPIME) 2 g in sodium chloride 0.9 % 100 mL IVPB  Status:  Discontinued     2 g 200 mL/hr over 30 Minutes Intravenous Every 12 hours 12/24/19 0512 12/24/19 0859   12/24/19 1000  meropenem (MERREM) 1 g in sodium chloride 0.9 % 100 mL IVPB  Status:  Discontinued     1 g 200 mL/hr over 30 Minutes Intravenous Every 12 hours 12/24/19 0859 12/24/19 1942   12/24/19 0315  vancomycin (VANCOREADY) IVPB 1250 mg/250 mL     1,250 mg 166.7 mL/hr over 90 Minutes Intravenous  Once 12/24/19 0250 12/24/19 0819   12/23/19 2045  ceFEPIme (MAXIPIME) 2 g in sodium chloride 0.9 % 100 mL IVPB     2 g 200 mL/hr over 30 Minutes Intravenous  Once 12/23/19 2030 12/23/19 2201      Interval History/Subjective  The patient is resting comfortably. No new complaints.  Objective   Vitals:  Vitals:   12/29/19 1100 12/29/19 1202  BP:  (!) 123/109  Pulse:  80  Resp:  (!) 23  Temp: 97.6 F (36.4 C)   SpO2:  100%    Exam:  Constitutional:  . The patient is awake, alert, and oriented x 3. No acute distress. Respiratory:  . Positive for increased work of breathing. . Diminished breath sounds bilateral . Mild bilateral rales . No wheezes or rhonchi are appreciated. . No tactile fremitus Cardiovascular:  . Regular rate and rhythm . No murmurs, ectopy, or gallups. . No lateral PMI. No thrills. Abdomen:  . Abdomen is soft, non-tender, non-distended . No hernias, masses, or organomegaly . Normoactive bowel sounds.  Musculoskeletal:  . No cyanosis, clubbing, or edema . Left heel is bandaged. Skin:  . No rashes, lesions, ulcers . palpation of skin: no induration or nodules . Stage III ulcer left heel. Neurologic:  . CN 2-12 intact . Pt is a quadriplegic Psychiatric:  . Mental status o Mood, affect appropriate o Orientation to person, place, time  . judgment and insight appear intact  I have personally reviewed the following:   Today's Data  . Vitals,  BMP, CBC  Micro Data  . Blood Cultures: 1 positive for Klebsiella pneumoniae, 1 positive for E. Coli, MRSA positive . 1 positive for candida paraplopsis  Cardiology Data  . Echo performed, EF 55-60%. Grade II diastolic dysfunction. Normal Global RV systolic function. Non valvular vegetations.  Scheduled Meds: . sodium chloride   Intravenous Once  . chlorhexidine gluconate (MEDLINE KIT)  15 mL Mouth Rinse BID  . Chlorhexidine Gluconate Cloth  6 each Topical Daily  .  feeding supplement (PRO-STAT SUGAR FREE 64)  30 mL Per Tube TID  . ferrous sulfate  300 mg Per Tube Q breakfast  . Gerhardt's butt cream  1 application Topical QID  . insulin aspart  0-15 Units Subcutaneous Q4H  . insulin aspart  5 Units Subcutaneous Q4H  . insulin glargine  10 Units Subcutaneous Daily  . ipratropium-albuterol  3 mL Nebulization TID  . mouth rinse  15 mL Mouth Rinse 10 times per day  . nutrition supplement (JUVEN)  1 packet Per Tube BID  . nystatin  5 mL Oral TID AC & HS  . pantoprazole sodium  40 mg Per Tube Q1200  . rifampin  600 mg Per Tube q1800  . sodium chloride flush  10-40 mL Intracatheter Q12H   Continuous Infusions: . sodium chloride Stopped (12/29/19 0533)  . feeding supplement (JEVITY 1.5 CAL/FIBER) 1,000 mL (12/28/19 2119)  . fluconazole (DIFLUCAN) IV 400 mg (12/29/19 0800)  . meropenem (MERREM) IV Stopped (12/28/19 2143)  . vancomycin 200 mL/hr at 12/29/19 0600    Principal Problem:   MRSA bacteremia Active Problems:   Anemia   Neutropenia (HCC)   Quadriplegia (HCC)   Diabetes ([place])   E coli bacteremia   Pressure ulcer of left heel, stage 3 (HCC)   Edema of right upper arm   Lead-less pacemaker   Fungemia   LOS: 6 days   A & P  Sepsis: Neutropenia, AKI, Hypothermia, Hypoxia, MRSA Bacteremia, GNR bacteremia, pulmonary edema due to sepsis.  Bacteremia: MRSA and Klebsiella pneumonia, E. Coli and Candida paraplopsis. Infectious disease consulted. Echocardiogram has been  performed and demonstrates no evidence of endocarditis. The patient is receiving IV Vancomycin, meropenem, and diflucan. 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 This resulted in her quadriplegia. She has been at [facility] for a month. Possible sources include, osteomyelitis of C-spine, PICC, foley catheter, heel wound. Left heel x-ray did not supply evidence of osteomyelitis. There is concern for the leadless pacemaker device that is seated in the ventricle wall. Rifampin has been added to coverage due to leadless pacemaker. Infectious disease has recommended IV vancomycin with rifampin daily for 6 weeks with resumption of suppressive cephalexin for contaminated hardware in the neck from previous surgery. Meropenem is to be continued for ESBL UTI. Last day for Meropenem will be 12/31/2019. On 12/27/2019 a blood culture grew out candida paraplopsis for which the patient was started on IV diflucan.  Neutropenia/Anemia: Severe. Likely due to Sepsis from bacteremia. Hematology consulted. The patient has been transfused with 2 units PRBC's. The plan is for bone marrow biopsy. I appreciate hematology/oncology. SPEP, Folate RBC pending, ANA negative, ESR elevated at 125, reticulocyte and DAT pending. WBC 0.6 12/28/2019. Hgb was 6.7 that day as well. She received one unit of PRBC's in transfusion. The following day her hemoglobin was improved at 7.4. WBC is 1.8 today.  Acute on chronic respiratory failure, tracheostomy and vent dependence: PCCM consultated. CXR demonstrated scattered bilateral opacities secondary to developing pulmonary edema due to acute on chronic diastolic stage II exacerbation, or atypical infectious process. Atelectasis also present. She continues to have oxygen saturation with 40% O2 by mechanical ventilation. PASSY MUIR Valve.   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 PT/OT will be consulted to maintain range of motion.    Right upper extremity swelling: Doppler of the right upper extremity is negative for DVT. Swelling is likely due to PICC in this extremity.  It is expected that this will be removed. I have asked that nursing elevate this extremity.  AKI: Creatinine is improved at 0.97 today. It is unknown what the baseline creatinine is, however, Given her reduced muscle mass from quadriplegia, I would be surprised if it were greater than 1.0.  Diabetes: HbA1c pending. The patient's glucoses have run 97 - 147 over the past 24 hours. She is receiving Lanuts 10 units daily with correction insulin and prandial insulin. She is now on tube feeds.  Hypoalbuminemia: Pt was receiving Isosource 1.5 cal at 55 cc/hr prior to admission. Dietary has been consulted for continuation of tube feeds per PEG.  Abnormal TSH: Euthyroid Sick Syndrome  I have seen and examined this patient myself. I have spent 34 minutes in his evaluation and care.  Keryl Gholson, DO [facility] Direct contact: see [email or web address]  7PM-7AM contact night coverage as above 12/29/2019, 12:15 PM  LOS: 1 day

## 2019-12-30 DIAGNOSIS — G8220 Paraplegia, unspecified: Secondary | ICD-10-CM

## 2019-12-30 DIAGNOSIS — R601 Generalized edema: Secondary | ICD-10-CM

## 2019-12-30 DIAGNOSIS — J9610 Chronic respiratory failure, unspecified whether with hypoxia or hypercapnia: Secondary | ICD-10-CM

## 2019-12-30 DIAGNOSIS — K0889 Other specified disorders of teeth and supporting structures: Secondary | ICD-10-CM

## 2019-12-30 LAB — COMPREHENSIVE METABOLIC PANEL
ALT: 24 U/L (ref 0–44)
AST: 17 U/L (ref 15–41)
Albumin: 1.3 g/dL — ABNORMAL LOW (ref 3.5–5.0)
Alkaline Phosphatase: 183 U/L — ABNORMAL HIGH (ref 38–126)
Anion gap: 4 — ABNORMAL LOW (ref 5–15)
BUN: 105 mg/dL — ABNORMAL HIGH (ref 8–23)
CO2: 33 mmol/L — ABNORMAL HIGH (ref 22–32)
Calcium: 9.1 mg/dL (ref 8.9–10.3)
Chloride: 114 mmol/L — ABNORMAL HIGH (ref 98–111)
Creatinine, Ser: 1.01 mg/dL — ABNORMAL HIGH (ref 0.44–1.00)
GFR calc Af Amer: >60 mL/min (ref >60)
GFR calc non Af Amer: 58 mL/min — ABNORMAL LOW (ref >60)
Glucose, Bld: 128 mg/dL — ABNORMAL HIGH (ref 70–99)
Potassium: 4.3 mmol/L (ref 3.5–5.1)
Sodium: 151 mmol/L — ABNORMAL HIGH (ref 135–145)
Total Bilirubin: 1 mg/dL (ref 0.3–1.2)
Total Protein: 7 g/dL (ref 6.5–8.1)

## 2019-12-30 LAB — CBC
HCT: 26.5 % — ABNORMAL LOW (ref 36.0–46.0)
Hemoglobin: 7.5 g/dL — ABNORMAL LOW (ref 12.0–15.0)
MCH: 28.5 pg (ref 26.0–34.0)
MCHC: 28.3 g/dL — ABNORMAL LOW (ref 30.0–36.0)
MCV: 100.8 fL — ABNORMAL HIGH (ref 80.0–100.0)
Platelets: 155 10*3/uL (ref 150–400)
RBC: 2.63 MIL/uL — ABNORMAL LOW (ref 3.87–5.11)
RDW: 19.5 % — ABNORMAL HIGH (ref 11.5–15.5)
WBC: 2.8 10*3/uL — ABNORMAL LOW (ref 4.0–10.5)
nRBC: 0 % (ref 0.0–0.2)

## 2019-12-30 LAB — GLUCOSE, CAPILLARY
Glucose-Capillary: 100 mg/dL — ABNORMAL HIGH (ref 70–99)
Glucose-Capillary: 110 mg/dL — ABNORMAL HIGH (ref 70–99)
Glucose-Capillary: 110 mg/dL — ABNORMAL HIGH (ref 70–99)
Glucose-Capillary: 127 mg/dL — ABNORMAL HIGH (ref 70–99)
Glucose-Capillary: 138 mg/dL — ABNORMAL HIGH (ref 70–99)
Glucose-Capillary: 144 mg/dL — ABNORMAL HIGH (ref 70–99)
Glucose-Capillary: 62 mg/dL — ABNORMAL LOW (ref 70–99)
Glucose-Capillary: 65 mg/dL — ABNORMAL LOW (ref 70–99)
Glucose-Capillary: 95 mg/dL (ref 70–99)

## 2019-12-30 LAB — VANCOMYCIN, TROUGH: Vancomycin Tr: 32 ug/mL (ref 15–20)

## 2019-12-30 MED ORDER — DEXTROSE 50 % IV SOLN
INTRAVENOUS | Status: AC
  Filled 2019-12-30: qty 50

## 2019-12-30 MED ORDER — LEVOTHYROXINE SODIUM 100 MCG/5ML IV SOLN
12.5000 ug | Freq: Every day | INTRAVENOUS | Status: DC
  Administered 2019-12-30 – 2019-12-31 (×2): 12.5 ug via INTRAVENOUS
  Filled 2019-12-30 (×3): qty 5

## 2019-12-30 MED ORDER — FREE WATER
250.0000 mL | Status: DC
  Administered 2019-12-30 – 2020-01-02 (×17): 250 mL

## 2019-12-30 MED ORDER — INSULIN GLARGINE 100 UNIT/ML ~~LOC~~ SOLN
6.0000 [IU] | Freq: Every day | SUBCUTANEOUS | Status: DC
  Administered 2019-12-31 – 2020-01-07 (×8): 6 [IU] via SUBCUTANEOUS
  Filled 2019-12-30 (×9): qty 0.06

## 2019-12-30 MED ORDER — DEXTROSE 50 % IV SOLN
12.5000 g | INTRAVENOUS | Status: AC
  Administered 2019-12-30: 12.5 g via INTRAVENOUS

## 2019-12-30 MED ORDER — FREE WATER
200.0000 mL | Status: DC
  Administered 2019-12-30: 200 mL

## 2019-12-30 MED ORDER — VANCOMYCIN HCL IN DEXTROSE 1-5 GM/200ML-% IV SOLN
1000.0000 mg | INTRAVENOUS | Status: DC
  Administered 2020-01-01 – 2020-01-07 (×4): 1000 mg via INTRAVENOUS
  Filled 2019-12-30 (×4): qty 200

## 2019-12-30 NOTE — Progress Notes (Signed)
Patient ID: Gabrielle Wade, female   DOB: 1956-08-02, 64 y.o.   MRN: [id number]         [facility]  Date of Admission:  12/23/2019           Day 8 vancomycin and rifampin        Day 7 meropenem        Day 4 high-dose fluconazole ASSESSMENT: She was admitted with polymicrobial bloodstream infection including MRSA, ESBL producing E. coli and Candida parapsilosis.  I suspect the source was the right arm PICC was removed after admission.  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  For her MRSA bacteremia I recommend treating for 6 weeks through 02/02/2020.  At that time I would restart oral cephalexin as chronic suppressive therapy for her previous MSSA infection.  She completes therapy for her ESBL producing E. coli bacteremia today.  Repeat blood cultures on 12/27/2019 are negative.  I would continue fluconazole through 01/10/2020.  PLAN: 1. Continue antibiotics as above 2. 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 sodium chloride   Intravenous Once  . chlorhexidine gluconate (MEDLINE KIT)  15 mL Mouth Rinse BID  . Chlorhexidine Gluconate Cloth  6 each Topical Daily  . feeding supplement (PRO-STAT SUGAR FREE 64)  30 mL Per Tube TID  . ferrous sulfate  300 mg Per Tube Q breakfast  . free water  200 mL Per Tube Q4H  . Gerhardt's butt cream  1 application Topical QID  . insulin aspart  0-15 Units Subcutaneous Q4H  . insulin aspart  5 Units Subcutaneous Q4H  . insulin glargine  10 Units Subcutaneous Daily  . ipratropium-albuterol  3 mL Nebulization TID  . mouth rinse  15 mL Mouth Rinse 10  times per day  . nutrition supplement (JUVEN)  1 packet Per Tube BID  . nystatin  5 mL Oral TID AC & HS  . pantoprazole sodium  40 mg Per Tube Q1200  . rifampin  600 mg Per Tube q1800  . sodium chloride flush  10-40 mL Intracatheter Q12H   Continuous Infusions: . sodium chloride 10 mL/hr at 12/30/19 0600  . feeding supplement (JEVITY 1.5 CAL/FIBER) 50 mL/hr at 12/30/19 0444  . fluconazole (DIFLUCAN) IV 400 mg (12/30/19 0855)  . meropenem (MERREM) IV Stopped (12/29/19 2149)  . [START ON 01/01/2020] vancomycin     PRN Meds:.sodium chloride, acetaminophen **OR** acetaminophen, ipratropium-albuterol, LORazepam, oxyCODONE, sodium chloride flush  Review of Systems: Review of Systems  Unable to perform ROS: Intubated    Allergies  Allergen Reactions  . Codeine   . Hydrocodone   . Influenza A (H1n1) Monovalent Vaccine   . Latex   . Morphine And Related   . Penicillins   . Shellfish Allergy   . Tape     OBJECTIVE: Vitals:   12/30/19 0600 12/30/19 0729 12/30/19 0818 12/30/19 1028  BP: (!) 118/47     Pulse: 70  74   Resp: 14  16   Temp:  97.7 F (36.5 C)    TempSrc:  Oral    SpO2:  99%  98% 97%  Weight:      Height:       Body mass index is 29.29 kg/m.  Physical Exam Constitutional:      Comments: She is alert and resting quietly in bed watching television.  She has anasarca.  HENT:     Mouth/Throat:     Comments: Poor dentition. Cardiovascular:     Rate and Rhythm: Normal rate and regular rhythm.     Heart sounds: No murmur.  Pulmonary:     Breath sounds: Wheezing present.  Skin:    Comments: Left midline catheter placed during this admission.     Lab Results Lab Results  Component Value Date   WBC 2.8 (L) 12/30/2019   HGB 7.5 (L) 12/30/2019   HCT 26.5 (L) 12/30/2019   MCV 100.8 (H) 12/30/2019   PLT 155 12/30/2019    Lab Results  Component Value Date   CREATININE 1.01 (H) 12/30/2019   BUN 105 (H) 12/30/2019   NA 151 (H) 12/30/2019   K 4.3 12/30/2019    CL 114 (H) 12/30/2019   CO2 33 (H) 12/30/2019    Lab Results  Component Value Date   ALT 24 12/30/2019   AST 17 12/30/2019   ALKPHOS 183 (H) 12/30/2019   BILITOT 1.0 12/30/2019     Microbiology: Recent Results (from the past 240 hour(s))  Blood culture (routine x 2)     Status: Abnormal   Collection Time: 12/23/19  4:25 PM   Specimen: BLOOD RIGHT ARM  Result Value Ref Range Status   Specimen Description BLOOD RIGHT ARM  Final   Special Requests   Final    BOTTLES DRAWN AEROBIC AND ANAEROBIC Blood Culture adequate volume   Culture  Setup Time   Final    GRAM POSITIVE COCCI IN BOTH AEROBIC AND ANAEROBIC BOTTLES CRITICAL VALUE NOTED.  VALUE IS CONSISTENT WITH PREVIOUSLY REPORTED AND CALLED VALUE.    Culture (A)  Final    STAPHYLOCOCCUS AUREUS SUSCEPTIBILITIES PERFORMED ON PREVIOUS CULTURE WITHIN THE LAST 5 DAYS. Performed at [facility], [place] [address] [place], [place] [address]    Report Status 12/26/2019 FINAL  Final  Respiratory Panel by RT PCR (Flu A&B, Covid) - Nasopharyngeal Swab     Status: None   Collection Time: 12/23/19  4:27 PM   Specimen: Nasopharyngeal Swab  Result Value Ref Range Status   SARS Coronavirus 2 by RT PCR NEGATIVE NEGATIVE Final    Comment: (NOTE) SARS-CoV-2 target nucleic acids are NOT DETECTED. The SARS-CoV-2 RNA is generally detectable in upper respiratoy specimens during the acute phase of infection. The lowest concentration of SARS-CoV-2 viral copies this assay can detect is 131 copies/mL. A negative result does not preclude SARS-Cov-2 infection and should not be used as the sole basis for treatment or other patient management decisions. A negative result may occur with  improper specimen collection/handling, submission of specimen other than nasopharyngeal swab, presence of viral mutation(s) within the areas targeted by this assay, and inadequate number of viral copies (<131 copies/mL). A negative result must be combined with  clinical observations, patient history, and epidemiological information. The expected result is Negative. Fact Sheet for Patients:  PinkCheek.be Fact Sheet for Healthcare Providers:  GravelBags.it This test is not yet ap proved or cleared by the [place] FDA and  has been authorized for detection and/or diagnosis of SARS-CoV-2 by FDA under an Emergency Use Authorization (EUA). This EUA will remain  in effect (meaning this test can be  used) for the duration of the COVID-19 declaration under Section 564(b)(1) of the Act, 21 U.S.C. section 360bbb-3(b)(1), unless the authorization is terminated or revoked sooner.    Influenza A by PCR NEGATIVE NEGATIVE Final   Influenza B by PCR NEGATIVE NEGATIVE Final    Comment: (NOTE) The Xpert Xpress SARS-CoV-2/FLU/RSV assay is intended as an aid in  the diagnosis of influenza from Nasopharyngeal swab specimens and  should not be used as a sole basis for treatment. Nasal washings and  aspirates are unacceptable for Xpert Xpress SARS-CoV-2/FLU/RSV  testing. Fact Sheet for Patients: PinkCheek.be Fact Sheet for Healthcare Providers: GravelBags.it This test is not yet approved or cleared by the [place] FDA and  has been authorized for detection and/or diagnosis of SARS-CoV-2 by  FDA under an Emergency Use Authorization (EUA). This EUA will remain  in effect (meaning this test can be used) for the duration of the  Covid-19 declaration under Section 564(b)(1) of the Act, 21  U.S.C. section 360bbb-3(b)(1), unless the authorization is  terminated or revoked. Performed at [facility], [place] [address], [place], [place] [address]   Urine culture     Status: Abnormal   Collection Time: 12/23/19  4:51 PM   Specimen: In/Out Cath Urine  Result Value Ref Range Status   Specimen Description IN/OUT CATH URINE  Final   Special  Requests   Final    NONE Performed at [facility], [place] [address], [place], [place] [address]    Culture (A)  Final    >=100,000 COLONIES/mL KLEBSIELLA PNEUMONIAE >=100,000 COLONIES/mL ESCHERICHIA COLI Confirmed Extended Spectrum Beta-Lactamase Producer (ESBL).  In bloodstream infections from ESBL organisms, carbapenems are preferred over piperacillin/tazobactam. They are shown to have a lower risk of mortality.    Report Status 12/25/2019 FINAL  Final   Organism ID, Bacteria KLEBSIELLA PNEUMONIAE (A)  Final   Organism ID, Bacteria ESCHERICHIA COLI (A)  Final      Susceptibility   Escherichia coli - MIC*    AMPICILLIN >=32 RESISTANT Resistant     CEFAZOLIN >=64 RESISTANT Resistant     CEFTRIAXONE >=64 RESISTANT Resistant     CIPROFLOXACIN >=4 RESISTANT Resistant     GENTAMICIN <=1 SENSITIVE Sensitive     IMIPENEM <=0.25 SENSITIVE Sensitive     NITROFURANTOIN <=16 SENSITIVE Sensitive     TRIMETH/SULFA <=20 SENSITIVE Sensitive     AMPICILLIN/SULBACTAM >=32 RESISTANT Resistant     PIP/TAZO 32 INTERMEDIATE Intermediate     * >=100,000 COLONIES/mL ESCHERICHIA COLI   Klebsiella pneumoniae - MIC*    AMPICILLIN >=32 RESISTANT Resistant     CEFAZOLIN <=4 SENSITIVE Sensitive     CEFTRIAXONE <=0.25 SENSITIVE Sensitive     CIPROFLOXACIN <=0.25 SENSITIVE Sensitive     GENTAMICIN <=1 SENSITIVE Sensitive     IMIPENEM <=0.25 SENSITIVE Sensitive     NITROFURANTOIN 128 RESISTANT Resistant     TRIMETH/SULFA <=20 SENSITIVE Sensitive     AMPICILLIN/SULBACTAM 16 INTERMEDIATE Intermediate     PIP/TAZO <=4 SENSITIVE Sensitive     * >=100,000 COLONIES/mL KLEBSIELLA PNEUMONIAE  Blood culture (routine x 2)     Status: Abnormal   Collection Time: 12/23/19  4:58 PM   Specimen: BLOOD  Result Value Ref Range Status   Specimen Description BLOOD RIGHT THUMB  Final   Special Requests   Final    BOTTLES DRAWN AEROBIC AND ANAEROBIC Blood Culture adequate volume   Culture  Setup Time   Final    GRAM  NEGATIVE RODS IN  BOTH AEROBIC AND ANAEROBIC BOTTLES GRAM POSITIVE COCCI CRITICAL RESULT CALLED TO, READ BACK BY AND VERIFIED WITH: J. FRENS PHARMD, AT 0820 12/24/19 BY D.V [facility] Performed at [facility], [place] [address], [place], [place] [address]    Culture (A)  Final    ESCHERICHIA COLI STAPHYLOCOCCUS AUREUS Confirmed Extended Spectrum Beta-Lactamase Producer (ESBL).  In bloodstream infections from ESBL organisms, carbapenems are preferred over piperacillin/tazobactam. They are shown to have a lower risk of mortality.    Report Status 12/26/2019 FINAL  Final   Organism ID, Bacteria ESCHERICHIA COLI  Final   Organism ID, Bacteria STAPHYLOCOCCUS AUREUS  Final      Susceptibility   Escherichia coli - MIC*    AMPICILLIN >=32 RESISTANT Resistant     CEFAZOLIN >=64 RESISTANT Resistant     CEFEPIME 16 RESISTANT Resistant     CEFTAZIDIME RESISTANT Resistant     CEFTRIAXONE >=64 RESISTANT Resistant     CIPROFLOXACIN >=4 RESISTANT Resistant     GENTAMICIN <=1 SENSITIVE Sensitive     IMIPENEM <=0.25 SENSITIVE Sensitive     TRIMETH/SULFA <=20 SENSITIVE Sensitive     AMPICILLIN/SULBACTAM 16 INTERMEDIATE Intermediate     PIP/TAZO <=4 SENSITIVE Sensitive     * ESCHERICHIA COLI   Staphylococcus aureus - MIC*    CIPROFLOXACIN >=8 RESISTANT Resistant     ERYTHROMYCIN >=8 RESISTANT Resistant     GENTAMICIN <=0.5 SENSITIVE Sensitive     OXACILLIN >=4 RESISTANT Resistant     TETRACYCLINE >=16 RESISTANT Resistant     VANCOMYCIN <=0.5 SENSITIVE Sensitive     TRIMETH/SULFA <=10 SENSITIVE Sensitive     CLINDAMYCIN <=0.25 SENSITIVE Sensitive     RIFAMPIN <=0.5 SENSITIVE Sensitive     Inducible Clindamycin NEGATIVE Sensitive     * STAPHYLOCOCCUS AUREUS  Blood Culture ID Panel (Reflexed)     Status: Abnormal   Collection Time: 12/23/19  4:58 PM  Result Value Ref Range Status   Enterococcus species NOT DETECTED NOT DETECTED Final   Listeria monocytogenes NOT DETECTED NOT DETECTED Final    Staphylococcus species DETECTED (A) NOT DETECTED Final    Comment: CRITICAL RESULT CALLED TO, READ BACK BY AND VERIFIED WITH: J. FRENS PHARMD, AT 5885 12/24/19 BY D. VANHOOK    Staphylococcus aureus (BCID) DETECTED (A) NOT DETECTED Final    Comment: Methicillin (oxacillin)-resistant Staphylococcus aureus (MRSA). MRSA is predictably resistant to beta-lactam antibiotics (except ceftaroline). Preferred therapy is vancomycin unless clinically contraindicated. Patient requires contact precautions if  hospitalized. CRITICAL RESULT CALLED TO, READ BACK BY AND VERIFIED WITH: J. FRENS PHARMD, AT 0277 12/24/19 BY D. VANHOOK    Methicillin resistance DETECTED (A) NOT DETECTED Final    Comment: CRITICAL RESULT CALLED TO, READ BACK BY AND VERIFIED WITH: J. FRENS PHARMD, AT 4128 12/24/19 BY D. VANHOOK    Streptococcus species NOT DETECTED NOT DETECTED Final   Streptococcus agalactiae NOT DETECTED NOT DETECTED Final   Streptococcus pneumoniae NOT DETECTED NOT DETECTED Final   Streptococcus pyogenes NOT DETECTED NOT DETECTED Final   Acinetobacter baumannii NOT DETECTED NOT DETECTED Final   Enterobacteriaceae species DETECTED (A) NOT DETECTED Final    Comment: Enterobacteriaceae represent a large family of gram-negative bacteria, not a single organism. CRITICAL RESULT CALLED TO, READ BACK BY AND VERIFIED WITH: J. FRENS PHARMD, AT 7867 12/24/19 BY D. VANHOOK    Enterobacter cloacae complex NOT DETECTED NOT DETECTED Final   Escherichia coli DETECTED (A) NOT DETECTED Final    Comment: CRITICAL RESULT CALLED TO, READ BACK BY AND  VERIFIED WITH: J. FRENS PHARMD, AT 8416 12/24/19 BY D. VANHOOK    Klebsiella oxytoca NOT DETECTED NOT DETECTED Final   Klebsiella pneumoniae NOT DETECTED NOT DETECTED Final   Proteus species NOT DETECTED NOT DETECTED Final   Serratia marcescens NOT DETECTED NOT DETECTED Final   Carbapenem resistance NOT DETECTED NOT DETECTED Final   Haemophilus influenzae NOT DETECTED NOT DETECTED Final    Neisseria meningitidis NOT DETECTED NOT DETECTED Final   Pseudomonas aeruginosa NOT DETECTED NOT DETECTED Final   Candida albicans NOT DETECTED NOT DETECTED Final   Candida glabrata NOT DETECTED NOT DETECTED Final   Candida krusei NOT DETECTED NOT DETECTED Final   Candida parapsilosis NOT DETECTED NOT DETECTED Final   Candida tropicalis NOT DETECTED NOT DETECTED Final    Comment: Performed at [facility], [place] [address], [place], [place] [address]  MRSA PCR Screening     Status: Abnormal   Collection Time: 12/24/19  5:50 PM   Specimen: Nasal Mucosa; Nasopharyngeal  Result Value Ref Range Status   MRSA by PCR POSITIVE (A) NEGATIVE Final    Comment:        The GeneXpert MRSA Assay (FDA approved for NASAL specimens only), is one component of a comprehensive MRSA colonization surveillance program. It is not intended to diagnose MRSA infection nor to guide or monitor treatment for MRSA infections. RESULT CALLED TO, READ BACK BY AND VERIFIED WITH: Everlean Alstrom RN 2017 12/24/19 A BROWNING Performed at [facility], [place] [address] [place], [place] [address]   Culture, blood (routine x 2)     Status: Abnormal   Collection Time: 12/25/19 10:26 AM   Specimen: BLOOD RIGHT HAND  Result Value Ref Range Status   Specimen Description BLOOD RIGHT HAND  Final   Special Requests   Final    BOTTLES DRAWN AEROBIC ONLY Blood Culture adequate volume   Culture  Setup Time   Final    AEROBIC BOTTLE ONLY YEAST CRITICAL RESULT CALLED TO, READ BACK BY AND VERIFIED WITH: Carmelia Bake [facility] 12/26/19 2340 JDW Performed at [facility], [place] [address], [place], [place] [address]    Culture CANDIDA PARAPSILOSIS (A)  Final   Report Status 12/28/2019 FINAL  Final  Blood Culture ID Panel (Reflexed)     Status: Abnormal   Collection Time: 12/25/19 10:26 AM  Result Value Ref Range Status   Enterococcus species NOT DETECTED NOT DETECTED Final   Listeria monocytogenes NOT DETECTED NOT DETECTED Final    Staphylococcus species NOT DETECTED NOT DETECTED Final   Staphylococcus aureus (BCID) NOT DETECTED NOT DETECTED Final   Streptococcus species NOT DETECTED NOT DETECTED Final   Streptococcus agalactiae NOT DETECTED NOT DETECTED Final   Streptococcus pneumoniae NOT DETECTED NOT DETECTED Final   Streptococcus pyogenes NOT DETECTED NOT DETECTED Final   Acinetobacter baumannii NOT DETECTED NOT DETECTED Final   Enterobacteriaceae species NOT DETECTED NOT DETECTED Final   Enterobacter cloacae complex NOT DETECTED NOT DETECTED Final   Escherichia coli NOT DETECTED NOT DETECTED Final   Klebsiella oxytoca NOT DETECTED NOT DETECTED Final   Klebsiella pneumoniae NOT DETECTED NOT DETECTED Final   Proteus species NOT DETECTED NOT DETECTED Final   Serratia marcescens NOT DETECTED NOT DETECTED Final   Haemophilus influenzae NOT DETECTED NOT DETECTED Final   Neisseria meningitidis NOT DETECTED NOT DETECTED Final   Pseudomonas aeruginosa NOT DETECTED NOT DETECTED Final   Candida albicans NOT DETECTED NOT DETECTED Final   Candida glabrata NOT DETECTED NOT DETECTED  Final   Candida krusei NOT DETECTED NOT DETECTED Final   Candida parapsilosis DETECTED (A) NOT DETECTED Final    Comment: CRITICAL RESULT CALLED TO, READ BACK BY AND VERIFIED WITH: K HAMMONS PHARMD 12/26/19 2340 JDW    Candida tropicalis NOT DETECTED NOT DETECTED Final    Comment: Performed at [place] [address] [place], [place] [address]  Culture, blood (routine x 2)     Status: Abnormal   Collection Time: 12/25/19 10:34 AM   Specimen: BLOOD RIGHT HAND  Result Value Ref Range Status   Specimen Description BLOOD RIGHT HAND  Final   Special Requests   Final    BOTTLES DRAWN AEROBIC ONLY Blood Culture adequate volume   Culture  Setup Time   Final    AEROBIC BOTTLE ONLY YEAST CRITICAL RESULT CALLED TO, READ BACK BY AND VERIFIED WITH: K HAMMONS PHARMD 12/26/19 2340 JDW    Culture (A)  Final    CANDIDA  PARAPSILOSIS STAPHYLOCOCCUS AUREUS SUSCEPTIBILITIES PERFORMED ON PREVIOUS CULTURE WITHIN THE LAST 5 DAYS. Performed at [facility], [place] [address], [place], [place] [address]    Report Status 12/29/2019 FINAL  Final  Culture, blood (routine x 2)     Status: None (Preliminary result)   Collection Time: 12/27/19  9:20 AM   Specimen: BLOOD RIGHT ARM  Result Value Ref Range Status   Specimen Description BLOOD RIGHT ARM  Final   Special Requests   Final    BOTTLES DRAWN AEROBIC AND ANAEROBIC Blood Culture results may not be optimal due to an inadequate volume of blood received in culture bottles   Culture   Final    NO GROWTH 3 DAYS Performed at [facility], [place] [address] [place], [place] [address]    Report Status PENDING  Incomplete  Culture, blood (routine x 2)     Status: None (Preliminary result)   Collection Time: 12/27/19  9:30 AM   Specimen: BLOOD LEFT HAND  Result Value Ref Range Status   Specimen Description BLOOD LEFT HAND  Final   Special Requests   Final    BOTTLES DRAWN AEROBIC ONLY Blood Culture adequate volume   Culture   Final    NO GROWTH 3 DAYS Performed at [facility], [place] [address] [place], [place] [address]    Report Status PENDING  Incomplete    Michel Bickers, MD [facility] [place] [facility] [phone] pager   [phone] cell 12/30/2019, 10:50 AM

## 2019-12-30 NOTE — Progress Notes (Signed)
NAME:  Gabrielle Wade, MRN:  [id number], DOB:  09-Jan-1956, LOS: 7 ADMISSION DATE:  12/23/2019, CONSULTATION DATE:  12/22/2018 REFERRING MD:  Dr. [name], CHIEF COMPLAINT:  Chronic vent management  Brief History    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 She is ventilator depended due to respiratory failure and was transferred from [facility] for evaluation of her leukopenia and anemia. PCCM was consulted by [facility] for chronic tracheostomy and ventilator management.   History of present illness   HPI obtained from medical chart review as patient is trach/ mechanical ventilator dependent.  No bedside medical records found, therefore limited HPI.   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  In ER, hemodynamically stable and initial temp 95.5.  Patient is awake in no distress on ventilator with settings PRVC, rate 12, FiO2 30%, TV 480, PEEP 5.  Unknown if patient weans.  Workup notable for WBC 0.8, Hgb 6.2, platelets 255, glucose 177, BUN 69, sCr 1.24, alk phos 247, albumin 1.2, corrected calcium 10.8, trop hs 5, lactic acid 1, normal coags, TSH 24.57, free T4 0.66, FOB negative, SARS2 PCR neg, UA negative.  CXR showing cardiomegaly with moderate bilateral pleural effusions, bilateral airspace and bibasilar opacities concerning for developing edema vs atypical infectious process vs atelectasis vs developing infiltrate.  TRH to admit, PCCM consulted for vent and tracheostomy management.   Past Medical History  Cervical epidural abscess- MRSA in 08/2019 with resultant quadriplegia with tracheostomy and ventilator dependent respiratory failure,  PEG, DMT2, CKD stage III, COPD, GERD, anxiety/ depression, PTSD  Significant Hospital Events   1/4 Admit [facility]  Consults:  PCCM - trach / vent   Procedures:  pta trach pta PEG   Significant Diagnostic Tests:  12/24/19 Complete Echocardiogram:  -LVEF 55-60% - Grade II Diastolic Dysfunction Micro Data:  1/4 SARS 2/ Flu A/B >> neg 1/4 UC >> >100K cfus of E coli and klebsiella pneumonia 1/4 BCx 2 >> preliminary staph species methicillin-resistant 1/4 urine strep >> (-) 1/4 urine legionella >> (-)  Antimicrobials:  1/4 cefepime >> 1/6 1/4 vanc >> 1/6 meropenem >>   Interim history/subjective:   O/N Events: Patient was found to be red overnight, vancomycin was held as Vanc trough pending. No complaints overnight.  Objective   Blood pressure (!) 118/47, pulse 70, temperature 98.2 F (36.8 C), temperature source Oral, resp. rate 14, height 5' 10"  (1.778 m), weight 92.6 kg, SpO2 99 %.    Vent Mode: PRVC FiO2 (%):  [30 %] 30 % Set Rate:  [12 bmp] 12 bmp Vt Set:  [480 mL] 480 mL PEEP:  [5 cmH20] 5 cmH20 Plateau Pressure:  [12 cmH20-18 cmH20] 18 cmH20   Intake/Output Summary (Last 24 hours) at 12/30/2019 5102 Last data filed at 12/30/2019 0600 Gross per 24 hour  Intake 1920.95 ml  Output 1145 ml  Net 775.95 ml    Examination: General: Awake in bed, able to answer questions by shaking and nodding her head to questions.  HEENT: normocephalic, atraumatic, trach intact, no scleral icterus noted.  Cardio: RRR no murmurs, rubs, or gallops Pulmonary: Clear to auscultation bilaterally, no rales, wheezes, or rhonchi appreciated.  Abdomen: BS present, non distended, non tender to  palpation MSK: no pitting edema noted bilaterally in the lower extremeties Neuro: able to answer questions appropriately with nodding and shaking of her head Skin: warm and dry.   Resolved Hospital Problem list     Assessment & Plan:   Chronic Respiratory Failure with Tracheostomy and PEG 2/2 to  Cervical Epidural Abscess Resulting in Quadriplegia Requiring Ventilation Support:   - Full vent support, no weaning - Current vent settings 40/5/12/480 - Titrate O2 for sat of 88-92% - Maintain current trach size and type  HCAP:  - Blood Cultures with MRSA - Urine Cultures + E. Coli and Klebsiella - On Vancomycin and Meropenem  Candidemia: - on fluconazole per primary team  Caloric Malnutrition:  -Continue tube feeds  [name], MD IMTS, PGY-1   Best practice:  Diet: NPO, PEG tube feeds resumed Pain/Anxiety/Delirium protocol (if indicated): not needed VAP protocol (if indicated): yes DVT prophylaxis: SCDs GI prophylaxis: PPI Glucose control: CBG q 4, SSI Mobility: PT/ OT Code Status: full  Family Communication: per primary, patient updated on plan of care Disposition: admit [facility]/ PCCM for chronic vent managment  Labs   CBC: Recent Labs  Lab 12/23/19 1604 12/23/19 2138 12/25/19 0129 12/26/19 0251 12/27/19 0335 12/28/19 3729 12/28/19 2035 12/29/19 0405 12/30/19 0458  WBC 0.8*   < > 0.8* 0.9* 1.2* 0.6*  --  1.8* 2.8*  NEUTROABS 0.0*  --  0.0* 0.0*  --   --   --   --   --   HGB 6.2*  --  7.1* 7.0* 7.2* 6.7* 7.6* 7.4* 7.5*  HCT 20.9*   < > 23.5* 23.5* 24.5* 22.3* 25.1* 24.2* 26.5*  MCV 100.0   < > 97.1 97.5 100.4* 99.1  --  96.8 100.8*  PLT 255   < > 207 209 185 128*  --  148* 155   < > = values in this interval not displayed.    Basic Metabolic Panel: Recent Labs  Lab 12/25/19 0129 12/26/19 0251 12/27/19 0335 12/28/19 0638 12/29/19 0405  NA 143 145 147* 147* 149*  K 4.4 4.0 4.0 3.5 4.0  CL 107 109 111 111 111  CO2 26 32 29 32 31  GLUCOSE 103* 169* 193* 280* 132*  BUN 76* 82* 95* 99* 104*  CREATININE 1.39* 1.38* 1.28* 1.22* 0.97  CALCIUM 8.3* 8.3* 8.4* 8.5* 8.7*   GFR: Estimated Creatinine Clearance: 71.3 mL/min (by C-G formula based on SCr of 0.97 mg/dL). Recent Labs  Lab 12/23/19 1610 12/24/19 0232 12/25/19 0129 12/27/19 0335  12/28/19 0211 12/29/19 0405 12/30/19 0458  PROCALCITON  --  0.32 0.32  --   --   --   --   WBC  --  0.7* 0.8* 1.2* 0.6* 1.8* 2.8*  LATICACIDVEN 1.0  --   --   --   --   --   --     Liver Function Tests: Recent Labs  Lab 12/23/19 1604 12/24/19 0232 12/25/19 0129 12/28/19 0638  AST 16 17 14* 15  ALT 40 38 33 25  ALKPHOS 247* 206* 175* 185*  BILITOT 0.6 0.7 0.7 1.0  PROT 6.8 6.9 6.6 6.4*  ALBUMIN 1.2* 1.3* 1.3* 1.1*   No results for input(s): LIPASE, AMYLASE in the last 168 hours. No results for input(s): AMMONIA in the last 168 hours.  ABG    Component Value Date/Time   PHART 7.455 (H) 12/23/2019 2138   PCO2ART 43.6 12/23/2019 2138   PO2ART 80.0 (L) 12/23/2019 2138   HCO3 30.7 (H) 12/23/2019  2138   TCO2 32 12/23/2019 2138   O2SAT 96.0 12/23/2019 2138     Coagulation Profile: Recent Labs  Lab 12/23/19 1623  INR 1.2    Cardiac Enzymes: No results for input(s): CKTOTAL, CKMB, CKMBINDEX, TROPONINI in the last 168 hours.  HbA1C: No results found for: HGBA1C  CBG: Recent Labs  Lab 12/29/19 2009 12/30/19 0011 12/30/19 0418 12/30/19 0424 12/30/19 0448  GLUCAP 136* 110* 65* 62* 100*

## 2019-12-30 NOTE — Progress Notes (Addendum)
Hypoglycemic Event  CBG: 65  Treatment: D50 25 mL (12.5 gm)  Symptoms: None  Follow-up CBG: Time:0445 CBG Result:100  Possible Reasons for Event: Unknown  Comments/MD notified: No interruptions in tube feeds. [facility] MD notified.   Nilesh Stegall A Yanett Conkright

## 2019-12-30 NOTE — Progress Notes (Signed)
CRITICAL VALUE ALERT  Critical Value:  Vanc trough 32   Date & Time Notied:  12/30/19 0700  Provider Notified: Leonarda Salon, Pharmacist and CCM rounding am doctor notified.   Orders Received/Actions taken: Current vancomycin running and stopped mid-infusion per pharmacy instructions. Pharmacy will adjust as necessary following vanc peak.

## 2019-12-30 NOTE — Progress Notes (Addendum)
HEMATOLOGY-ONCOLOGY PROGRESS NOTE  SUBJECTIVE: Resting quietly. No bleeding noted. Just completed Meropenem for ESBL. Remains on Vancomycin for MSSA and Diflucan for Candida.   REVIEW OF SYSTEMS:   No fevers or bleeding. Remainder of the ROS is noncontributory.   I have reviewed the past medical history, past surgical history, social history and family history with the patient and they are unchanged from previous note.   PHYSICAL EXAMINATION:  Vitals:   12/30/19 1200 12/30/19 1300  BP: (!) 122/42 (!) 118/39  Pulse: 71 66  Resp: (!) 25 13  Temp:    SpO2: 96% 93%   Filed Weights   12/28/19 0230 12/29/19 0206 12/30/19 0445  Weight: 201 lb 1 oz (91.2 kg) 200 lb 13.4 oz (91.1 kg) 204 lb 2.3 oz (92.6 kg)    Intake/Output from previous day: 01/10 0701 - 01/11 0700 In: 1942.9 [I.V.:205.7; NG/GT:1150; IV Piggyback:587.2] Out: 9935 [TSVXB:9390]  GENERAL:chronically ill appearing female. No distress.  LUNGS: diminished bilaterally HEART: regular rate & rhythm and no murmurs  ABDOMEN:abdomen soft, non-tender and normal bowel sounds Musculoskeletal:no cyanosis of digits and no clubbing  NEURO: alert, difficult to understand  LABORATORY DATA:  I have reviewed the data as listed CMP Latest Ref Rng & Units 12/30/2019 12/29/2019 12/28/2019  Glucose 70 - 99 mg/dL 128(H) 132(H) 280(H)  BUN 8 - 23 mg/dL 105(H) 104(H) 99(H)  Creatinine 0.44 - 1.00 mg/dL 1.01(H) 0.97 1.22(H)  Sodium 135 - 145 mmol/L 151(H) 149(H) 147(H)  Potassium 3.5 - 5.1 mmol/L 4.3 4.0 3.5  Chloride 98 - 111 mmol/L 114(H) 111 111  CO2 22 - 32 mmol/L 33(H) 31 32  Calcium 8.9 - 10.3 mg/dL 9.1 8.7(L) 8.5(L)  Total Protein 6.5 - 8.1 g/dL 7.0 - 6.4(L)  Total Bilirubin 0.3 - 1.2 mg/dL 1.0 - 1.0  Alkaline Phos 38 - 126 U/L 183(H) - 185(H)  AST 15 - 41 U/L 17 - 15  ALT 0 - 44 U/L 24 - 25    Lab Results  Component Value Date   WBC 2.8 (L) 12/30/2019   HGB 7.5 (L) 12/30/2019   HCT 26.5 (L) 12/30/2019   MCV 100.8 (H)  12/30/2019   PLT 155 12/30/2019   NEUTROABS 0.0 (L) 12/26/2019    CT CHEST WO CONTRAST  Result Date: 12/24/2019 CLINICAL DATA:  Shortness of breath and chest pain EXAM: CT CHEST WITHOUT CONTRAST TECHNIQUE: Multidetector CT imaging of the chest was performed following the standard protocol without IV contrast. COMPARISON:  Chest x-ray from the previous day. FINDINGS: Cardiovascular: Somewhat limited due to lack of IV contrast. Aortic calcifications are seen without aneurysmal dilatation. No significant cardiac enlargement is seen. Implantable pacemaker is noted within the right ventricle. No significant coronary calcifications are noted. Mediastinum/Nodes: Thoracic inlet demonstrates a tracheostomy tube in satisfactory position. No focal mass lesion is seen. Scattered small mediastinal and hilar lymph nodes are seen likely reactive in nature given the findings in the lungs. The esophagus appears within normal limits. Lungs/Pleura: Large pleural effusions are noted bilaterally right slightly greater than left with associated lower lobe consolidation. Changes of mucous plugging are noted within the lower lobe bronchial tree on the left. No large central mass is seen. The upper lobes demonstrates some posterior dependent atelectatic changes. Small apical nodules are noted bilaterally. The largest of these is seen in the right upper lobe measuring approximately 6 mm. Upper Abdomen: Visualized upper abdomen shows no acute abnormality Musculoskeletal: Multilevel degenerative changes of the thoracic spine are seen. No acute bony abnormality is  noted. Postsurgical changes in the cervicothoracic spine are seen. IMPRESSION: Large bilateral pleural effusions with underlying consolidation in the lower lobes and upper lobe atelectatic changes. These changes are consistent with bilateral pneumonia. Reactive lymphadenopathy is seen. Small nodular densities within the apices bilaterally. Non-contrast chest CT at 3-6 months is  recommended. If the nodules are stable at time of repeat CT, then future CT at 18-24 months (from today's scan) is considered optional for low-risk patients, but is recommended for high-risk patients. This recommendation follows the consensus statement: Guidelines for Management of Incidental Pulmonary Nodules Detected on CT Images: From the Fleischner Society 2017; Radiology 2017; 284:228-243. Aortic Atherosclerosis (ICD10-I70.0). Electronically Signed   By: Inez Catalina M.D.   On: 12/24/2019 17:30   DG CHEST PORT 1 VIEW  Result Date: 12/28/2019 CLINICAL DATA:  Respiratory crackles. EXAM: PORTABLE CHEST 1 VIEW COMPARISON:  Chest CT 12/24/2019 and chest radiograph 12/23/2019 FINDINGS: Tracheostomy tube overlies the airway. The cardiac silhouette remains mildly enlarged. Veiling opacities in both lower lungs are similar to the prior radiograph and consistent with right larger than left pleural effusions. Patchy airspace opacities in the perihilar regions and upper lobes have improved. Right greater than left lower lung airspace opacities have at most slightly improved on the left. No pneumothorax is identified. IMPRESSION: 1. Persistent right larger than left pleural effusions with basilar atelectasis or consolidation. 2. Mildly improved perihilar and upper lung opacities which may reflect decreased edema or pneumonia. Electronically Signed   By: Logan Bores M.D.   On: 12/28/2019 08:38   DG Chest Port 1 View  Result Date: 12/23/2019 CLINICAL DATA:  Sepsis EXAM: PORTABLE CHEST 1 VIEW COMPARISON:  None. FINDINGS: There are moderate to large bilateral pleural effusions. The heart size is enlarged. There is scattered bilateral airspace opacities. There are prominent interstitial lung markings. There are dense bibasilar airspace opacities. There is no pneumothorax. There are old healed left-sided rib fractures. IMPRESSION: 1. Cardiomegaly with volume overload including moderate bilateral pleural effusions. 2.  Scattered bilateral airspace opacities may be secondary to developing pulmonary edema or an atypical infectious process. 3. Bibasilar airspace opacities favored to represent atelectasis with an infiltrate not excluded. Electronically Signed   By: Constance Holster M.D.   On: 12/23/2019 17:25   DG Foot 2 Views Left  Result Date: 12/25/2019 CLINICAL DATA:  64 year old female with bacteremia and left heel wound. Query osteomyelitis. EXAM: LEFT FOOT - 2 VIEW COMPARISON:  None. FINDINGS: The calcaneus appears intact with degenerative spurring. There is soft tissue swelling in the left foot maximal at the metatarsal region. Calcified peripheral vascular disease. No soft tissue gas. There is a tiny 2-3 millimeter nonspecific density or dystrophic calcification along the plantar surface of the foot at the level of the metatarsals (arrow). No acute fracture or cortical osteolysis identified. There is a healed fracture of the 5th metatarsal. IMPRESSION: 1. Soft tissue swelling with no plain radiographic evidence of osteomyelitis. 2. Calcified peripheral vascular disease. Tiny 2-3 millimeter nonspecific foreign body or dystrophic calcification in the plantar soft tissues (arrow). 3. Healed 5th metatarsal fracture. Electronically Signed   By: Genevie Ann M.D.   On: 12/25/2019 15:10   ECHOCARDIOGRAM COMPLETE  Result Date: 12/24/2019   ECHOCARDIOGRAM REPORT   Patient Name:   Gabrielle Wade Date of Exam: 12/24/2019 Medical Rec #:  [id number]      Height:       70.0 in Accession #:    [id number]     Weight:  201.0 lb Date of Birth:  [birth year]     BSA:          2.09 m Patient Age:    64 years       BP:           118/43 mmHg Patient Gender: F              HR:           69 bpm. Exam Location:  Inpatient Procedure: 2D Echo Indications:    Dyspnea R06.00  History:        Patient has no prior history of Echocardiogram examinations.                 COPD; Risk Factors:Diabetes.  Sonographer:    Mikki Santee RDCS (AE) Referring  Phys: Santee  1. Left ventricular ejection fraction, by visual estimation, is 55 to 60%. The left ventricle has normal function. There is no left ventricular hypertrophy.  2. Left ventricular diastolic parameters are consistent with Grade II diastolic dysfunction (pseudonormalization).  3. Elevated left ventricular end-diastolic pressure.  4. Global right ventricle has normal systolic function.The right ventricular size is normal. No increase in right ventricular wall thickness.  5. Left atrial size was normal.  6. Right atrial size was normal.  7. The mitral valve is normal in structure. Mild mitral valve regurgitation. No evidence of mitral stenosis.  8. The tricuspid valve is normal in structure.  9. The aortic valve was not well visualized. Aortic valve regurgitation is not visualized. No evidence of aortic valve sclerosis or stenosis. 10. The pulmonic valve was normal in structure. Pulmonic valve regurgitation is trivial. 11. Normal pulmonary artery systolic pressure. 12. The inferior vena cava is dilated in size with >50% respiratory variability, suggesting right atrial pressure of 8 mmHg. FINDINGS  Left Ventricle: Left ventricular ejection fraction, by visual estimation, is 55 to 60%. The left ventricle has normal function. The left ventricle is not well visualized. There is no left ventricular hypertrophy. Left ventricular diastolic parameters are consistent with Grade II diastolic dysfunction (pseudonormalization). Elevated left ventricular end-diastolic pressure. Right Ventricle: The right ventricular size is normal. No increase in right ventricular wall thickness. Global RV systolic function is has normal systolic function. The tricuspid regurgitant velocity is 1.83 m/s, and with an assumed right atrial pressure  of 8 mmHg, the estimated right ventricular systolic pressure is normal at 21.4 mmHg. Left Atrium: Left atrial size was normal in size. Right Atrium: Right atrial size was  normal in size Pericardium: There is no evidence of pericardial effusion. Mitral Valve: The mitral valve is normal in structure. Mild mitral valve regurgitation. No evidence of mitral valve stenosis by observation. Tricuspid Valve: The tricuspid valve is normal in structure. Tricuspid valve regurgitation is trivial. Aortic Valve: The aortic valve was not well visualized. Aortic valve regurgitation is not visualized. The aortic valve is structurally normal, with no evidence of sclerosis or stenosis. Pulmonic Valve: The pulmonic valve was normal in structure. Pulmonic valve regurgitation is trivial. Pulmonic regurgitation is trivial. Aorta: The aortic root, ascending aorta and aortic arch are all structurally normal, with no evidence of dilitation or obstruction. Venous: The inferior vena cava is dilated in size with greater than 50% respiratory variability, suggesting right atrial pressure of 8 mmHg. IAS/Shunts: No atrial level shunt detected by color flow Doppler. There is no evidence of a patent foramen ovale. No ventricular septal defect is seen or detected. There is no evidence of an  atrial septal defect.  LEFT VENTRICLE PLAX 2D LVIDd:         5.20 cm  Diastology LVIDs:         3.50 cm  LV e' lateral:   6.42 cm/s LV PW:         0.70 cm  LV E/e' lateral: 19.0 LV IVS:        0.80 cm  LV e' medial:    6.09 cm/s LVOT diam:     2.10 cm  LV E/e' medial:  20.0 LV SV:         79 ml LV SV Index:   36.71 LVOT Area:     3.46 cm  RIGHT VENTRICLE RV S prime:     11.00 cm/s TAPSE (M-mode): 2.2 cm LEFT ATRIUM             Index       RIGHT ATRIUM           Index LA diam:        4.10 cm 1.96 cm/m  RA Area:     16.40 cm LA Vol (A2C):   37.2 ml 17.78 ml/m RA Volume:   42.20 ml  20.17 ml/m LA Vol (A4C):   33.8 ml 16.16 ml/m LA Biplane Vol: 37.9 ml 18.12 ml/m  AORTIC VALVE LVOT Vmax:   90.50 cm/s LVOT Vmean:  57.500 cm/s LVOT VTI:    0.200 m  AORTA Ao Root diam: 3.10 cm MITRAL VALVE                         TRICUSPID VALVE MV  Area (PHT): 3.65 cm              TR Peak grad:   13.4 mmHg MV PHT:        60.32 msec            TR Vmax:        183.00 cm/s MV Decel Time: 208 msec MV E velocity: 122.00 cm/s 103 cm/s  SHUNTS MV A velocity: 98.80 cm/s  70.3 cm/s Systemic VTI:  0.20 m MV E/A ratio:  1.23        1.5       Systemic Diam: 2.10 cm  Cherlynn Kaiser MD Electronically signed by Cherlynn Kaiser MD Signature Date/Time: 12/24/2019/8:36:20 PM    Final    VAS [place] UPPER EXTREMITY VENOUS DUPLEX  Result Date: 12/25/2019 UPPER VENOUS STUDY  Indications: Swelling Risk Factors: None identified. Limitations: Poor ultrasound/tissue interface, body habitus, bandages and patient immobility. Comparison Study: No prior studies. Performing Technologist: Oliver Hum RVT  Examination Guidelines: A complete evaluation includes B-mode imaging, spectral Doppler, color Doppler, and power Doppler as needed of all accessible portions of each vessel. Bilateral testing is considered an integral part of a complete examination. Limited examinations for reoccurring indications may be performed as noted.  Right Findings: +----------+------------+---------+-----------+----------+-------+ RIGHT     CompressiblePhasicitySpontaneousPropertiesSummary +----------+------------+---------+-----------+----------+-------+ IJV           Full       Yes       Yes                      +----------+------------+---------+-----------+----------+-------+ Subclavian    Full       Yes       Yes                      +----------+------------+---------+-----------+----------+-------+ Axillary  Full       Yes       Yes                      +----------+------------+---------+-----------+----------+-------+ Brachial      Full       Yes       Yes                      +----------+------------+---------+-----------+----------+-------+ Radial        Full                                           +----------+------------+---------+-----------+----------+-------+ Ulnar         Full                                          +----------+------------+---------+-----------+----------+-------+ Cephalic      Full                                          +----------+------------+---------+-----------+----------+-------+ Basilic       Full                                          +----------+------------+---------+-----------+----------+-------+  Left Findings: +----------+------------+---------+-----------+----------+-------+ LEFT      CompressiblePhasicitySpontaneousPropertiesSummary +----------+------------+---------+-----------+----------+-------+ Subclavian    Full       Yes       Yes                      +----------+------------+---------+-----------+----------+-------+  Summary:  Right: No evidence of deep vein thrombosis in the upper extremity. No evidence of superficial vein thrombosis in the upper extremity.  Left: No evidence of thrombosis in the subclavian.  *See table(s) above for measurements and observations.  Diagnosing physician: Curt Jews MD Electronically signed by Curt Jews MD on 12/25/2019 at 4:40:08 PM.    Final     ASSESSMENT: 64 y.o. [facility] resident with history of epidural abscess/stroke, with paraplegia, trach/ventilator dependent, presenting with severe leukopenia and anemia but no thrombocytopenia. Has bacteremia due to MRSA and Klebsiella pneumonia, E. Coli and Candida paraplopsis.    PLAN: CBC from today has been reviewed. WBC improving and platelets are normal. Hemoglobin is stable. This is all likely due to sepsis/inflammation and not an underlying bone marrow disorder. Continue supportive PRBC transfusions for hemoglobin <7.0.   Hematology will sign off. Please call for questions.    LOS: 7 days   Mikey Bussing, DNP, AGPCNP-BC, AOCNP 12/30/19   ADDENDUM: Since our initial consult we obtained records from [place].   Specifically on 10/11/2019 Gabrielle Wade's white cell count was 7.1 hemoglobin 7.5 MCV 95 and platelets 249,000.  On 12/16/2019 her white cell count was 4.8 hemoglobin 7.1 and platelets 173,000.  Accordingly what was really different at her admission here was the extreme leukopenia and absolute neutropenia.  We reviewed the blood film which did not suggest a leukemia (and a normal platelet count also militate against that).  We considered a bone marrow biopsy but the patient refused the procedure.  The patient's white cell count now is recovering back to baseline with treatment of the underlying infections.  The hemoglobin is stable at preadmission levels and the platelet count remains normal.  Accordingly there does not appear to be a primary hematologic problem and certainly no need for bone marrow biopsy.  We will sign off at this time.  Please reconsult as needed.    Chauncey Cruel, MD Medical Oncology and Hematology [facility] [address] [place],  [address] Tel. [phone]    Fax. [phone]

## 2019-12-30 NOTE — Progress Notes (Signed)
Pharmacy Antibiotic Note  Gabrielle Wade is a 64 y.o. female with h/o quadraplegia admitted on 12/23/2019 with possible HCAP found to have ESBL E.coli, MRSA and candida parapilosis bacteremia.  Pharmacy has been consulted for Vancomycin  Dosing.     Patient on 1g q24h with a peak of 42 and trough of 32.  Patient specific kinetics: ke: 0.0133 t1/2: 52 hours, Vd: 84.5L Will hold vancomycin x48 hours and restart at 1g q48  Plan: Vancomycin 1g IV q48h F/U renal function  Height: 5\' 10"  (177.8 cm) Weight: 204 lb 2.3 oz (92.6 kg) IBW/kg (Calculated) : 68.5  Est wt 80 kg  Temp (24hrs), Avg:97.8 F (36.6 C), Min:97 F (36.1 C), Max:98.3 F (36.8 C)  Recent Labs  Lab 12/23/19 1610 12/26/19 0251 12/27/19 0335 12/28/19 02/25/20 12/29/19 0405 12/29/19 0831 12/30/19 0458  WBC  --  0.9* 1.2* 0.6* 1.8*  --  2.8*  CREATININE  --  1.38* 1.28* 1.22* 0.97  --  1.01*  LATICACIDVEN 1.0  --   --   --   --   --   --   VANCOTROUGH  --   --   --   --   --   --  32*  VANCOPEAK  --   --   --   --   --  42*  --     Estimated Creatinine Clearance: 68.5 mL/min (A) (by C-G formula based on SCr of 1.01 mg/dL (H)).    Allergies  Allergen Reactions  . Codeine   . Hydrocodone   . Influenza A (H1n1) Monovalent Vaccine   . Latex   . Morphine And Related   . Penicillins   . Shellfish Allergy   . Tape     02/27/20 [facility] 12/30/2019 8:15 AM

## 2019-12-30 NOTE — Progress Notes (Addendum)
PROGRESS NOTE  Gabrielle Wade FIE:[id number] DOB: Sep 18, 1956 DOA: 12/23/2019 PCP: Townsend Roger, MD  Brief History   Gabrielle Wade  is a 64 y.o. female, w anxiety/ depression,PTSD,  hx of epidural abscess (mrsa), w quadriplegia , Dm2, CKD stage3,  Copd, Chronic Trach/ vent dependence, Gerd, chronic dysphagia, Anemia, presents with  Hgb 6.8, and wbc 0.6 per [facility].    Pt notes slight dyspnea and dry cough, but unable to tell me how long this has been going on . Pt denies fever ,chills, cp, palp, n/v, diarrhea, brbpr, black stool.    In the ED the patient was found to have Temp of 95.5 and SaO2 of 96% on 30% FIO2. CXR demonstrates scattered bilateral airspace opacities, atelectasis, and cardiomegaly with volume overload. WBC was 0.8, Hgb 6.2, and creatinine of 1.24 with Alk phos 247. FOBT was negative. Blood cultures x 2 were obtained. They have grown out MRSA from one bottle and GNR from the other. ID has been consulted as has hematology/oncology. PCCM has been consulted for management of the patient's trach and vent.  The patient is receiving IV Meropenem and Vancomycin. She has been diuresed with Lasix 20 mg IV x 1. Meropenem was discontinued on 12/30/2019 by ID as she had completed treatment for her ESBL.   Tube feeds have been restarted.  Echocardiogram has been performed. It has demonsdtrated an EF of 50-55% and Grade II diastolic dysfunction.  Blood cultures (1/2) drawn on 12/25/2019 have grown out Candida parapsilosis. The patient has been started on diflucan   She was transfused with one unit PRBC's on 12/28/2019 due to hemoglobin of 6.7.   Consultants  PCCM Infectious disease Hematology/Oncology Interventional radiology  Procedures  None  Antibiotics   Anti-infectives (From admission, onward)    Start     Dose/Rate Route Frequency Ordered Stop   01/01/20 0700  vancomycin (VANCOCIN) IVPB 1000 mg/200 mL premix     1,000 mg 200 mL/hr over 60 Minutes Intravenous Every 48 hours  12/30/19 0819     12/28/19 0800  fluconazole (DIFLUCAN) IVPB 200 mg  Status:  Discontinued     200 mg 100 mL/hr over 60 Minutes Intravenous Every 24 hours 12/27/19 0012 12/27/19 0724   12/28/19 0800  fluconazole (DIFLUCAN) IVPB 400 mg     400 mg 100 mL/hr over 120 Minutes Intravenous Every 24 hours 12/27/19 0724     12/27/19 0609  vancomycin (VANCOCIN) IVPB 1000 mg/200 mL premix  Status:  Discontinued     1,000 mg 200 mL/hr over 60 Minutes Intravenous Every 24 hours 12/26/19 0742 12/30/19 0740   12/27/19 0230  fluconazole (DIFLUCAN) IVPB 400 mg     400 mg 100 mL/hr over 120 Minutes Intravenous Every 1 hr x 2 12/27/19 0222 12/27/19 0714   12/27/19 0100  fluconazole (DIFLUCAN) IVPB 800 mg  Status:  Discontinued     800 mg 200 mL/hr over 120 Minutes Intravenous  Once 12/27/19 0012 12/27/19 0222   12/26/19 2200  meropenem (MERREM) 1 g in sodium chloride 0.9 % 100 mL IVPB     1 g 200 mL/hr over 30 Minutes Intravenous Every 12 hours 12/26/19 1337 12/30/19 2359   12/26/19 1800  rifampin (RIFADIN) 60 mg/mL oral suspension 600 mg     600 mg Per Tube Daily-1800 12/25/19 2207     12/26/19 1000  fluconazole (DIFLUCAN) tablet 200 mg  Status:  Discontinued     200 mg Per Tube Daily 12/26/19 0514 12/27/19 0012   12/26/19 0600  vancomycin (VANCOCIN) IVPB 1000 mg/200 mL premix  Status:  Discontinued     1,000 mg 200 mL/hr over 60 Minutes Intravenous Every 48 hours 12/24/19 0512 12/26/19 0742   12/25/19 1900  meropenem (MERREM) 1 g in sodium chloride 0.9 % 100 mL IVPB  Status:  Discontinued     1 g 200 mL/hr over 30 Minutes Intravenous Every 8 hours 12/25/19 1838 12/26/19 1337   12/25/19 1630  rifampin (RIFADIN) 60 mg/mL oral suspension 600 mg  Status:  Discontinued     600 mg Per Tube Daily 12/25/19 1553 12/25/19 2207   12/24/19 2250  meropenem (MERREM) 1 g in sodium chloride 0.9 % 100 mL IVPB  Status:  Discontinued     1 g 200 mL/hr over 30 Minutes Intravenous Every 8 hours 12/24/19 1942 12/25/19  1838   12/24/19 1815  fluconazole (DIFLUCAN) 40 MG/ML suspension 200 mg  Status:  Discontinued     200 mg Per Tube Daily 12/24/19 1804 12/26/19 0514   12/24/19 1445  fluconazole (DIFLUCAN) 40 MG/ML suspension 200 mg  Status:  Discontinued     200 mg Per Tube Daily 12/24/19 1436 12/24/19 1804   12/24/19 1000  ceFEPIme (MAXIPIME) 2 g in sodium chloride 0.9 % 100 mL IVPB  Status:  Discontinued     2 g 200 mL/hr over 30 Minutes Intravenous Every 12 hours 12/24/19 0512 12/24/19 0859   12/24/19 1000  meropenem (MERREM) 1 g in sodium chloride 0.9 % 100 mL IVPB  Status:  Discontinued     1 g 200 mL/hr over 30 Minutes Intravenous Every 12 hours 12/24/19 0859 12/24/19 1942   12/24/19 0315  vancomycin (VANCOREADY) IVPB 1250 mg/250 mL     1,250 mg 166.7 mL/hr over 90 Minutes Intravenous  Once 12/24/19 0250 12/24/19 0819   12/23/19 2045  ceFEPIme (MAXIPIME) 2 g in sodium chloride 0.9 % 100 mL IVPB     2 g 200 mL/hr over 30 Minutes Intravenous  Once 12/23/19 2030 12/23/19 2201     Meropenem: Course completed on 12/30/2019 Vancomycin to be continued through 02/02/2020. Then patient should be restarted on keflex for treatment of MSSA. Fluconazole continue through 01/10/2020.  Interval History/Subjective  The patient is resting comfortably. The patient is awake and alert. She attempts to communicate, but I am unable to read her lips.   Objective   Vitals:  Vitals:   12/30/19 1200 12/30/19 1300  BP: (!) 122/42 (!) 118/39  Pulse: 71 66  Resp: (!) 25 13  Temp:    SpO2: 96% 93%    Exam:  Constitutional:  The patient is awake and alert. She attempts to communicate, but I am unable to read her lips. No acute distress. Respiratory:  No increased work of breathing. Diminished breath sounds bilaterally No wheezes or rhonchi are appreciated. No tactile fremitus Cardiovascular:  Regular rate and rhythm No murmurs, ectopy, or gallups. No lateral PMI. No thrills. Abdomen:  Abdomen is soft,  non-tender, non-distended No hernias, masses, or organomegaly Normoactive bowel sounds.  Musculoskeletal:  No cyanosis, clubbing, or edema Left heel is bandaged. Skin:  No rashes, lesions, ulcers palpation of skin: no induration or nodules Stage III ulcer left heel. Neurologic:  CN 2-12 intact Pt is a quadriplegic Psychiatric:  Mental status Mood, affect appropriate judgment and insight appear intact  I have personally reviewed the following:   Today's Data  Vitals, BMP, CBC  Micro Data  Blood Cultures: 1 positive for Klebsiella pneumoniae, 1 positive for E. Coli, MRSA  positive 1 positive for candida paraplopsis  Cardiology Data  Echo performed, EF 55-60%. Grade II diastolic dysfunction. Normal Global RV systolic function. Non valvular vegetations.  Scheduled Meds:  sodium chloride   Intravenous Once   chlorhexidine gluconate (MEDLINE KIT)  15 mL Mouth Rinse BID   Chlorhexidine Gluconate Cloth  6 each Topical Daily   feeding supplement (PRO-STAT SUGAR FREE 64)  30 mL Per Tube TID   ferrous sulfate  300 mg Per Tube Q breakfast   free water  200 mL Per Tube Q4H   Gerhardt's butt cream  1 application Topical QID   insulin aspart  0-15 Units Subcutaneous Q4H   insulin aspart  5 Units Subcutaneous Q4H   insulin glargine  10 Units Subcutaneous Daily   ipratropium-albuterol  3 mL Nebulization TID   mouth rinse  15 mL Mouth Rinse 10 times per day   nutrition supplement (JUVEN)  1 packet Per Tube BID   nystatin  5 mL Oral TID AC & HS   pantoprazole sodium  40 mg Per Tube Q1200   rifampin  600 mg Per Tube q1800   sodium chloride flush  10-40 mL Intracatheter Q12H   Continuous Infusions:  sodium chloride 10 mL/hr at 12/30/19 1200   feeding supplement (JEVITY 1.5 CAL/FIBER) 50 mL/hr at 12/30/19 0444   fluconazole (DIFLUCAN) IV Stopped (12/30/19 1055)   meropenem (MERREM) IV Stopped (12/30/19 1146)   [START ON 01/01/2020] vancomycin      Principal Problem:   MRSA  bacteremia Active Problems:   Anemia   Neutropenia (HCC)   Quadriplegia (HCC)   Diabetes ([place])   E coli bacteremia   Pressure ulcer of left heel, stage 3 (HCC)   Edema of right upper arm   Lead-less pacemaker   Candidemia ([place])   Chronic respiratory failure (HCC)   LOS: 7 days   A & P  Sepsis: Neutropenia, AKI, Hypothermia, Hypoxia, MRSA Bacteremia, GNR bacteremia, pulmonary edema due to sepsis.  Bacteremia: MRSA and Klebsiella pneumonia, E. Coli and Candida paraplopsis. Infectious disease consulted. Echocardiogram has been performed and demonstrates no evidence of endocarditis. The patient is receiving IV Vancomycin, meropenem, and diflucan. 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 This resulted in her quadriplegia. She has been at [facility] for a month. Possible sources include, osteomyelitis of C-spine, PICC, foley catheter, heel wound. Left heel x-ray did not supply evidence of osteomyelitis. There is concern for the leadless pacemaker device that is seated in the ventricle wall. Rifampin has been added to coverage due to leadless pacemaker. Eiology of MRSA bacteremia remains clinically undetermined. Infectious disease has recommended IV vancomycin with rifampin daily for 6 weeks with resumption of suppressive cephalexin for contaminated hardware in the neck from previous surgery. Meropenem is to be continued for ESBL UTI. Last day for Meropenem will be 12/31/2019. On 12/27/2019 a blood culture grew out candida paraplopsis for which the patient was started on IV diflucan. Infectious disease has discontinued meropenem today for treatement of the ESBL. The Vancomycin will be continued through 02/02/2020 after which the patient will be restarted on keflex for continued treatment of MSSA.Marland Kitchen Diflucan will be continued through 01/10/2020.  Neutropenia/Anemia: Severe. Likely due to Sepsis from bacteremia. Hematology consulted. The  patient has been transfused with 2 units PRBC's. The plan is for bone marrow biopsy. I appreciate hematology/oncology. SPEP, Folate RBC pending, ANA negative, ESR elevated at 125, reticulocyte and DAT pending. WBC 0.6 12/28/2019. Hgb  was 6.7 that day as well. She received one unit of PRBC's in transfusion. Today her hemoglobin was improved at 7.5. WBC is 2.8 today.  Hypernatremia: Sodium today is 151. Will increase free water flushes in tube feeds from 200cc q4 hours to 250 q 4 hours. Monitor.  Acute on chronic respiratory failure, tracheostomy and vent dependence: PCCM consultated. CXR demonstrated scattered bilateral opacities secondary to developing pulmonary edema due to acute on chronic diastolic stage II exacerbation, or atypical infectious process. Atelectasis also present. She continues to have oxygen saturation with 40% O2 by mechanical ventilation. PASSY MUIR Valve.   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 PT/OT will be consulted to maintain range of motion.   Right upper extremity swelling: Doppler of the right upper extremity is negative for DVT. Swelling is likely due to PICC in this extremity. It is expected that this will be removed. I have asked that nursing elevate this extremity.  AKI: Creatinine is improved at 1.01 today. It is unknown what the baseline creatinine is, however, Given her reduced muscle mass from quadriplegia, I would be surprised if it were greater than 1.0.  Diabetes: HbA1c pending. She is receiving Lanuts 10 units daily with correction insulin and prandial insulin. She is now on tube feeds. I will decrease lantus. The patient was hypoglycemic this morning. Over the last 24 hours her glucoses have been 62 - 127. Monitor.  Hypoalbuminemia: Pt was receiving Isosource 1.5 cal at 55 cc/hr prior to admission. Dietary has been consulted for continuation of tube feeds per PEG.  Abnormal TSH: Tc and T4 low. Hypothyroidism. Supplement with levothyroxine.  I have  seen and examined this patient myself. I have spent 32 minutes in his evaluation and care.  Gabrielle Hakim, DO [facility] Direct contact: see [email or web address]  7PM-7AM contact night coverage as above 12/30/2019, 1:52 PM  LOS: 1 day

## 2019-12-30 NOTE — Progress Notes (Signed)
PHARMACY CONSULT NOTE FOR:  OUTPATIENT  PARENTERAL ANTIBIOTIC THERAPY (OPAT)  Indication: MRSA and Candida bacteremia  Regimen: Vancomycin 1000mg  IV q48h and fluconazole 400mg  IV q24h End date: 02/02/2020(Vancomycin) and 01/10/2020 (fluconazole)  IV antibiotic discharge orders are pended. To discharging provider:  please sign these orders via discharge navigator,  Select New Orders & click on the button choice - Manage This Unsigned Work.     Thank you for allowing pharmacy to be a part of this patient's care.  02/04/2020 12/30/2019, 1:18 PM

## 2019-12-30 NOTE — Progress Notes (Signed)
Addendum: Hematology has signed out in a separate note.  Here I just wanted to document some of the micro results from [facility] hospitalis from the records they are forwarded to [place].    Specifically on 10/07/2019 tracheal aspirate showed only light growth of usual flora on 10/06/2019 stool showed no evidence of C. difficile.  On 10/07/2019 urine culture grew Klebsiella pneumonia, more than 10 to the 5 colony-forming units resistant to ampicillin, ampicillin sulbactam and cefuroxime, intermediate to amoxicillin clavulanate, susceptible to cefazolin gentamicin levofloxacin and PIP days ago.  Also to trimethoprim sulfa.  Blood culture 11/17/2019 showed no growth

## 2019-12-31 LAB — COMPREHENSIVE METABOLIC PANEL
ALT: 25 U/L (ref 0–44)
AST: 16 U/L (ref 15–41)
Albumin: 1.2 g/dL — ABNORMAL LOW (ref 3.5–5.0)
Alkaline Phosphatase: 184 U/L — ABNORMAL HIGH (ref 38–126)
Anion gap: 4 — ABNORMAL LOW (ref 5–15)
BUN: 104 mg/dL — ABNORMAL HIGH (ref 8–23)
CO2: 34 mmol/L — ABNORMAL HIGH (ref 22–32)
Calcium: 9.3 mg/dL (ref 8.9–10.3)
Chloride: 113 mmol/L — ABNORMAL HIGH (ref 98–111)
Creatinine, Ser: 1.02 mg/dL — ABNORMAL HIGH (ref 0.44–1.00)
GFR calc Af Amer: >60 mL/min (ref >60)
GFR calc non Af Amer: 58 mL/min — ABNORMAL LOW (ref >60)
Glucose, Bld: 122 mg/dL — ABNORMAL HIGH (ref 70–99)
Potassium: 4.2 mmol/L (ref 3.5–5.1)
Sodium: 151 mmol/L — ABNORMAL HIGH (ref 135–145)
Total Bilirubin: 0.6 mg/dL (ref 0.3–1.2)
Total Protein: 7 g/dL (ref 6.5–8.1)

## 2019-12-31 LAB — POCT I-STAT 7, (LYTES, BLD GAS, ICA,H+H)
Acid-Base Excess: 9 mmol/L — ABNORMAL HIGH (ref 0.0–2.0)
Bicarbonate: 34.9 mmol/L — ABNORMAL HIGH (ref 20.0–28.0)
Calcium, Ion: 1.37 mmol/L (ref 1.15–1.40)
HCT: 25 % — ABNORMAL LOW (ref 36.0–46.0)
Hemoglobin: 8.5 g/dL — ABNORMAL LOW (ref 12.0–15.0)
O2 Saturation: 80 %
Patient temperature: 97.6
Potassium: 4.4 mmol/L (ref 3.5–5.1)
Sodium: 152 mmol/L — ABNORMAL HIGH (ref 135–145)
TCO2: 37 mmol/L — ABNORMAL HIGH (ref 22–32)
pCO2 arterial: 53.5 mmHg — ABNORMAL HIGH (ref 32.0–48.0)
pH, Arterial: 7.42 (ref 7.350–7.450)
pO2, Arterial: 43 mmHg — ABNORMAL LOW (ref 83.0–108.0)

## 2019-12-31 LAB — CBC
HCT: 26.7 % — ABNORMAL LOW (ref 36.0–46.0)
Hemoglobin: 7.6 g/dL — ABNORMAL LOW (ref 12.0–15.0)
MCH: 29 pg (ref 26.0–34.0)
MCHC: 28.5 g/dL — ABNORMAL LOW (ref 30.0–36.0)
MCV: 101.9 fL — ABNORMAL HIGH (ref 80.0–100.0)
Platelets: 164 10*3/uL (ref 150–400)
RBC: 2.62 MIL/uL — ABNORMAL LOW (ref 3.87–5.11)
RDW: 19 % — ABNORMAL HIGH (ref 11.5–15.5)
WBC: 2.9 10*3/uL — ABNORMAL LOW (ref 4.0–10.5)
nRBC: 0 % (ref 0.0–0.2)

## 2019-12-31 LAB — BASIC METABOLIC PANEL
Anion gap: 4 — ABNORMAL LOW (ref 5–15)
BUN: 106 mg/dL — ABNORMAL HIGH (ref 8–23)
CO2: 33 mmol/L — ABNORMAL HIGH (ref 22–32)
Calcium: 9.3 mg/dL (ref 8.9–10.3)
Chloride: 112 mmol/L — ABNORMAL HIGH (ref 98–111)
Creatinine, Ser: 0.91 mg/dL (ref 0.44–1.00)
GFR calc Af Amer: >60 mL/min (ref >60)
GFR calc non Af Amer: >60 mL/min (ref >60)
Glucose, Bld: 184 mg/dL — ABNORMAL HIGH (ref 70–99)
Potassium: 4.2 mmol/L (ref 3.5–5.1)
Sodium: 149 mmol/L — ABNORMAL HIGH (ref 135–145)

## 2019-12-31 LAB — GLUCOSE, CAPILLARY
Glucose-Capillary: 99 mg/dL (ref 70–99)
Glucose-Capillary: 121 mg/dL — ABNORMAL HIGH (ref 70–99)
Glucose-Capillary: 173 mg/dL — ABNORMAL HIGH (ref 70–99)
Glucose-Capillary: 152 mg/dL — ABNORMAL HIGH (ref 70–99)
Glucose-Capillary: 119 mg/dL — ABNORMAL HIGH (ref 70–99)

## 2019-12-31 LAB — SARS CORONAVIRUS 2 (TAT 6-24 HRS): SARS Coronavirus 2: NEGATIVE

## 2019-12-31 MED ORDER — INSULIN ASPART 100 UNIT/ML ~~LOC~~ SOLN: 5.0000 [IU] | SUBCUTANEOUS | 11 refills | Status: AC

## 2019-12-31 MED ORDER — ACETAMINOPHEN 325 MG PO TABS: 650.0000 mg | ORAL_TABLET | Freq: Four times a day (QID) | ORAL | 0 refills | Status: AC | PRN

## 2019-12-31 MED ORDER — FREE WATER: 250.0000 mL | 0 refills | Status: DC

## 2019-12-31 MED ORDER — PANTOPRAZOLE SODIUM 40 MG PO PACK: 40.0000 mg | PACK | Freq: Every day | ORAL | 0 refills | Status: AC

## 2019-12-31 MED ORDER — GERHARDT'S BUTT CREAM: 1.0000 "application " | TOPICAL_CREAM | Freq: Four times a day (QID) | CUTANEOUS | 0 refills | Status: AC

## 2019-12-31 MED ORDER — LEVOTHYROXINE SODIUM 25 MCG PO TABS: 25.0000 ug | ORAL_TABLET | Freq: Every day | ORAL | 0 refills | Status: AC

## 2019-12-31 MED ORDER — FLUCONAZOLE IV FOR PTA / DISCHARGE USE ONLY): 400.0000 mg | INTRAVENOUS | 0 refills | Status: AC

## 2019-12-31 MED ORDER — NYSTATIN 100000 UNIT/ML MT SUSP: 5.0000 mL | Freq: Three times a day (TID) | OROMUCOSAL | 0 refills | Status: DC

## 2019-12-31 MED ORDER — RIFAMPIN ORAL SUSPENSION 25 MG/ML: 600.0000 mg | Freq: Every day | ORAL | 0 refills | Status: DC

## 2019-12-31 MED ORDER — VANCOMYCIN IV (FOR PTA / DISCHARGE USE ONLY): 1000.0000 mg | INTRAVENOUS | 0 refills | Status: AC

## 2019-12-31 MED ORDER — OXYCODONE HCL 5 MG PO TABS: 5.0000 mg | ORAL_TABLET | ORAL | 0 refills | Status: AC | PRN

## 2019-12-31 MED ORDER — LORAZEPAM 2 MG/ML PO CONC: 1.0000 mg | ORAL | 0 refills | Status: AC | PRN

## 2019-12-31 MED ORDER — LEVOTHYROXINE SODIUM 25 MCG PO TABS
25.0000 ug | ORAL_TABLET | Freq: Every day | ORAL | Status: DC
  Administered 2020-01-01 – 2020-01-07 (×7): 25 ug via ORAL
  Filled 2019-12-31 (×7): qty 1

## 2019-12-31 MED ORDER — FERROUS SULFATE 300 (60 FE) MG/5ML PO SYRP: 300.0000 mg | ORAL_SOLUTION | Freq: Every day | ORAL | 3 refills | Status: AC

## 2019-12-31 MED ORDER — INSULIN GLARGINE 100 UNIT/ML ~~LOC~~ SOLN: 6.0000 [IU] | Freq: Every day | SUBCUTANEOUS | 11 refills | Status: AC

## 2019-12-31 MED ORDER — PRO-STAT SUGAR FREE PO LIQD: 30.0000 mL | Freq: Three times a day (TID) | ORAL | 0 refills | Status: AC

## 2019-12-31 NOTE — Progress Notes (Signed)
PROGRESS NOTE  Gabrielle Wade BLT:[id number] DOB: 11/30/1956 DOA: 12/23/2019 PCP: Townsend Roger, MD  Brief History   Gabrielle Wade  is a 64 y.o. female, w anxiety/ depression,PTSD,  hx of epidural abscess (mrsa), w quadriplegia , Dm2, CKD stage3,  Copd, Chronic Trach/ vent dependence, Gerd, chronic dysphagia, Anemia, presents with  Hgb 6.8, and wbc 0.6 per [facility].    Pt notes slight dyspnea and dry cough, but unable to tell me how long this has been going on . Pt denies fever ,chills, cp, palp, n/v, diarrhea, brbpr, black stool.    In the ED the patient was found to have Temp of 95.5 and SaO2 of 96% on 30% FIO2. CXR demonstrates scattered bilateral airspace opacities, atelectasis, and cardiomegaly with volume overload. WBC was 0.8, Hgb 6.2, and creatinine of 1.24 with Alk phos 247. FOBT was negative. Blood cultures x 2 were obtained. They have grown out MRSA from one bottle and GNR from the other. ID has been consulted as has hematology/oncology. PCCM has been consulted for management of the patient's trach and vent.  The patient is receiving IV Meropenem and Vancomycin. She has been diuresed with Lasix 20 mg IV x 1. Meropenem was discontinued on 12/30/2019 by ID as she had completed treatment for her ESBL. Vancomycin will continue through 02/02/2020  Tube feeds have been restarted. She is receiving Jevity 1.5 cal at 50 cc/hr with 250 cc free water flushes Q4hours.   Echocardiogram has been performed. It has demonsdtrated an EF of 50-55% and Grade II diastolic dysfunction.  Blood cultures (1/2) drawn on 12/25/2019 have grown out Candida parapsilosis. The patient has been started on diflucan. Diflucan will continue through 01/09/2020.  She was transfused with one unit PRBC's on 12/28/2019 due to hemoglobin of 6.7. Hemoglobin today is 7.6.  Consultants  . PCCM . Infectious disease . Hematology/Oncology . Interventional radiology  Procedures  . None  Antibiotics   Anti-infectives (From  admission, onward)   Start     Dose/Rate Route Frequency Ordered Stop   01/01/20 0700  vancomycin (VANCOCIN) IVPB 1000 mg/200 mL premix     1,000 mg 200 mL/hr over 60 Minutes Intravenous Every 48 hours 12/30/19 0819     12/28/19 0800  fluconazole (DIFLUCAN) IVPB 200 mg  Status:  Discontinued     200 mg 100 mL/hr over 60 Minutes Intravenous Every 24 hours 12/27/19 0012 12/27/19 0724   12/28/19 0800  fluconazole (DIFLUCAN) IVPB 400 mg     400 mg 100 mL/hr over 120 Minutes Intravenous Every 24 hours 12/27/19 0724     12/27/19 0609  vancomycin (VANCOCIN) IVPB 1000 mg/200 mL premix  Status:  Discontinued     1,000 mg 200 mL/hr over 60 Minutes Intravenous Every 24 hours 12/26/19 0742 12/30/19 0740   12/27/19 0230  fluconazole (DIFLUCAN) IVPB 400 mg     400 mg 100 mL/hr over 120 Minutes Intravenous Every 1 hr x 2 12/27/19 0222 12/27/19 0714   12/27/19 0100  fluconazole (DIFLUCAN) IVPB 800 mg  Status:  Discontinued     800 mg 200 mL/hr over 120 Minutes Intravenous  Once 12/27/19 0012 12/27/19 0222   12/26/19 2200  meropenem (MERREM) 1 g in sodium chloride 0.9 % 100 mL IVPB     1 g 200 mL/hr over 30 Minutes Intravenous Every 12 hours 12/26/19 1337 12/30/19 2333   12/26/19 1800  rifampin (RIFADIN) 60 mg/mL oral suspension 600 mg     600 mg Per Tube Daily-1800 12/25/19 2207  12/26/19 1000  fluconazole (DIFLUCAN) tablet 200 mg  Status:  Discontinued     200 mg Per Tube Daily 12/26/19 0514 12/27/19 0012   12/26/19 0600  vancomycin (VANCOCIN) IVPB 1000 mg/200 mL premix  Status:  Discontinued     1,000 mg 200 mL/hr over 60 Minutes Intravenous Every 48 hours 12/24/19 0512 12/26/19 0742   12/25/19 1900  meropenem (MERREM) 1 g in sodium chloride 0.9 % 100 mL IVPB  Status:  Discontinued     1 g 200 mL/hr over 30 Minutes Intravenous Every 8 hours 12/25/19 1838 12/26/19 1337   12/25/19 1630  rifampin (RIFADIN) 60 mg/mL oral suspension 600 mg  Status:  Discontinued     600 mg Per Tube Daily 12/25/19  1553 12/25/19 2207   12/24/19 2250  meropenem (MERREM) 1 g in sodium chloride 0.9 % 100 mL IVPB  Status:  Discontinued     1 g 200 mL/hr over 30 Minutes Intravenous Every 8 hours 12/24/19 1942 12/25/19 1838   12/24/19 1815  fluconazole (DIFLUCAN) 40 MG/ML suspension 200 mg  Status:  Discontinued     200 mg Per Tube Daily 12/24/19 1804 12/26/19 0514   12/24/19 1445  fluconazole (DIFLUCAN) 40 MG/ML suspension 200 mg  Status:  Discontinued     200 mg Per Tube Daily 12/24/19 1436 12/24/19 1804   12/24/19 1000  ceFEPIme (MAXIPIME) 2 g in sodium chloride 0.9 % 100 mL IVPB  Status:  Discontinued     2 g 200 mL/hr over 30 Minutes Intravenous Every 12 hours 12/24/19 0512 12/24/19 0859   12/24/19 1000  meropenem (MERREM) 1 g in sodium chloride 0.9 % 100 mL IVPB  Status:  Discontinued     1 g 200 mL/hr over 30 Minutes Intravenous Every 12 hours 12/24/19 0859 12/24/19 1942   12/24/19 0315  vancomycin (VANCOREADY) IVPB 1250 mg/250 mL     1,250 mg 166.7 mL/hr over 90 Minutes Intravenous  Once 12/24/19 0250 12/24/19 0819   12/23/19 2045  ceFEPIme (MAXIPIME) 2 g in sodium chloride 0.9 % 100 mL IVPB     2 g 200 mL/hr over 30 Minutes Intravenous  Once 12/23/19 2030 12/23/19 2201    . Meropenem: Course completed on 12/30/2019 . Vancomycin to be continued through 02/02/2020. Then patient should be restarted on keflex for treatment of MSSA. Marland Kitchen Fluconazole continue through 01/10/2020.  Interval History/Subjective  The patient is resting comfortably. The patient is awake and alert. She attempts to communicate, but I am unable to read her lips.   Objective   Vitals:  Vitals:   12/31/19 1000 12/31/19 1100  BP: (!) 140/54 133/60  Pulse: 81 60  Resp: 17 16  Temp:  97.6 F (36.4 C)  SpO2: 96% 94%    Exam:  Constitutional:  . The patient is awake and alert. She attempts to communicate, but I am unable to read her lips. No acute distress. Respiratory:  . No increased work of breathing. . Diminished  breath sounds bilaterally . No wheezes or rhonchi are appreciated. . No tactile fremitus Cardiovascular:  . Regular rate and rhythm . No murmurs, ectopy, or gallups. . No lateral PMI. No thrills. Abdomen:  . Abdomen is soft, non-tender, non-distended . No hernias, masses, or organomegaly . Normoactive bowel sounds.  Musculoskeletal:  . No cyanosis, clubbing, or edema . Left heel is bandaged. Skin:  . No rashes, lesions, ulcers . palpation of skin: no induration or nodules . Stage III ulcer left heel. Neurologic:  . CN  2-12 intact . Pt is a quadriplegic Psychiatric:  . Mental status o Mood, affect appropriate . judgment and insight appear intact  I have personally reviewed the following:   Today's Data  . Vitals, BMP, CBC  Micro Data  . Blood Cultures: 1 positive for Klebsiella pneumoniae, 1 positive for E. Coli, MRSA positive . 1 positive for candida paraplopsis  Cardiology Data  . Echo performed, EF 55-60%. Grade II diastolic dysfunction. Normal Global RV systolic function. Non valvular vegetations.  Scheduled Meds: . sodium chloride   Intravenous Once  . chlorhexidine gluconate (MEDLINE KIT)  15 mL Mouth Rinse BID  . Chlorhexidine Gluconate Cloth  6 each Topical Daily  . feeding supplement (PRO-STAT SUGAR FREE 64)  30 mL Per Tube TID  . ferrous sulfate  300 mg Per Tube Q breakfast  . free water  250 mL Per Tube Q4H  . Gerhardt's butt cream  1 application Topical QID  . insulin aspart  0-15 Units Subcutaneous Q4H  . insulin aspart  5 Units Subcutaneous Q4H  . insulin glargine  6 Units Subcutaneous Daily  . ipratropium-albuterol  3 mL Nebulization TID  . [START ON 01/01/2020] levothyroxine  25 mcg Oral Q0600  . mouth rinse  15 mL Mouth Rinse 10 times per day  . nutrition supplement (JUVEN)  1 packet Per Tube BID  . nystatin  5 mL Oral TID AC & HS  . pantoprazole sodium  40 mg Per Tube Q1200  . rifampin  600 mg Per Tube q1800  . sodium chloride flush  10-40 mL  Intracatheter Q12H   Continuous Infusions: . sodium chloride Stopped (12/30/19 1220)  . feeding supplement (JEVITY 1.5 CAL/FIBER) 50 mL/hr at 12/31/19 0716  . fluconazole (DIFLUCAN) IV 400 mg (12/31/19 0849)  . [START ON 01/01/2020] vancomycin      Principal Problem:   MRSA bacteremia Active Problems:   Anemia   Neutropenia ([place])   Quadriplegia (HCC)   Diabetes ([place])   E coli bacteremia   Pressure ulcer of left heel, stage 3 (HCC)   Edema of right upper arm   Lead-less pacemaker   Candidemia ([place])   Chronic respiratory failure (HCC)   LOS: 8 days   A & P  Sepsis: Resolving. Neutropenia, AKI, Hypothermia, Hypoxia, MRSA Bacteremia, GNR bacteremia, pulmonary edema due to sepsis.  Bacteremia: MRSA and Klebsiella pneumonia, E. Coli and Candida paraplopsis. Infectious disease consulted. Echocardiogram has been performed and demonstrates no evidence of endocarditis. The patient is receiving IV Vancomycin, meropenem, and diflucan. 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 This resulted in her quadriplegia. She has been at [facility] for a month. Possible sources include, osteomyelitis of C-spine, PICC, foley catheter, heel wound. Left heel x-ray did not supply evidence of osteomyelitis. There is concern for the leadless pacemaker device that is seated in the ventricle wall. Rifampin has been added to coverage due to leadless pacemaker. Eiology of MRSA bacteremia remains clinically undetermined. Infectious disease has recommended IV vancomycin with rifampin daily for 6 weeks with resumption of suppressive cephalexin for contaminated hardware in the neck from previous surgery. Meropenem is to be continued for ESBL UTI. Last day for Meropenem will be 12/31/2019. On 12/27/2019 a blood culture grew out candida paraplopsis for which the patient was started on IV diflucan. Infectious disease has discontinued meropenem today for treatement of  the ESBL. The Vancomycin will be continued through 02/02/2020 after which the patient will be restarted  on keflex for continued treatment of MSSA.Marland Kitchen Diflucan will be continued through 01/10/2020.  Neutropenia/Anemia: Severe. Likely due to Sepsis from bacteremia. Hematology consulted. The patient has been transfused with 2 units PRBC's. SPEP, Folate RBC pending, ANA negative, ESR elevated at 125, reticulocyte and DAT pending. WBC 0.6 12/28/2019. Hgb was 6.7 that day as well. She received one unit of PRBC's in transfusion. Today her hemoglobin was improved at 7.5. WBC is 2.8 today. Ultimately hematology/oncology has determined that bone marrow biopsy will not be necessary.  Hypernatremia: Sodium remains 151. Free water flushes in tube feeds has been increased from 200cc q4 hours to 250 q 4 hours. Monitor.  Acute on chronic respiratory failure, tracheostomy and vent dependence: PCCM consultated. CXR demonstrated scattered bilateral opacities secondary to developing pulmonary edema due to acute on chronic diastolic stage II exacerbation, or atypical infectious process. Atelectasis also present. She continues to have oxygen saturation with 40% O2 by mechanical ventilation. PASSY MUIR Valve.   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 PT/OT has been consulted to maintain range of motion.   Right upper extremity swelling: Doppler of the right upper extremity is negative for DVT. Swelling is likely due to PICC in this extremity. It is expected that this will be removed. I have asked that nursing elevate this extremity.  AKI: Creatinine is improved at 1.02 today. It is unknown what the baseline creatinine is, however, Given her reduced muscle mass from quadriplegia, I would be surprised if it were greater than 1.0.  Diabetes: HbA1c pending. She is receiving Lanuts 10 units daily with correction insulin and prandial insulin. She is now on tube feeds. I will decrease lantus. The patient was hypoglycemic this  morning. Over the last 24 hours her glucoses have been 99 - 173. Monitor.  Hypoalbuminemia: Pt is receiving Jevity 1.5 cal at 50 cc/hr with 250 cc free water flushes Q4hours. She is tolerating tube feeds well  Abnormal TSH: Tc and T4 low. Hypothyroidism. Supplement with levothyroxine.  I have seen and examined this patient myself. I have spent 34 minutes in his evaluation and care.  CODE STATUS: Full Code DVT Prophylaxis: SCD's Family communication: none available Disposition: Return to LTAC if patient continue IV antibiotics/antigungals at [facility].  Samanthan Dugo, DO [facility] Direct contact: see [email or web address]  7PM-7AM contact night coverage as above 12/31/2019, 12:26 PM  LOS: 1 day

## 2019-12-31 NOTE — Discharge Summary (Addendum)
Physician Discharge Summary  Gabrielle Wade ACZ:[id number] DOB: [birth year] DOA: 12/23/2019  PCP: Townsend Roger, MD  Admit date: 12/23/2019 Discharge date: 12/31/2019  Recommendations for Outpatient Follow-up:  1. Discharge to SNF 2. Complete course of IV Vancomycin. Last day is 02/02/2020.  3. Complete course of Rifampin 600 mg daily on 02/02/2020 4. Complete course of diflucan on 01/10/2020.  Discharge Diagnoses: Principal diagnosis is #1 1. Hypernatremia 2. MRSA and Candidal Bacteremia 3. ESBL UTI 4. Anemia and neutropenia due to severe infection an sepsis. 5. Chronic respiratory failure/ventilator dependent. 6. Quadriplegia 7. Right upper extremity swelling - No DVT 8. Anasarca 9. Quadriplegia, Hypothyroidism  Discharge Condition: Fair Disposition: SNF  Diet recommendation: Tube feeds: Jevity 1.5 cal at 50 cc/hr with 250 cc free water flushes Q4hours.   Filed Weights   12/29/19 0206 12/30/19 0445 12/31/19 0414  Weight: 91.1 kg 92.6 kg 91.8 kg    History of present illness:  Gabrielle Wade  is a 64 y.o. female, w anxiety/ depression,PTSD,  hx of epidural abscess (mrsa), w quadriplegia , Dm2, CKD stage3,  Copd, Chronic Trach/ vent dependence, Gerd, chronic dysphagia, Anemia, presents with ? Thrombocytopenia, Hgb 6.8, and wbc 0.6 per [facility].   Pt notes slight dyspnea and dry cough, but unable to tell me how long this has been going on . Pt denies fever ,chills, cp, palp, n/v, diarrhea, brbpr, black stool.    In ED,  T 95.5, P 74 R 18, Bp 146/81  Pox 96% on 30% FIO2  CXR IMPRESSION: 1. Cardiomegaly with volume overload including moderate bilateral pleural effusions. 2. Scattered bilateral airspace opacities may be secondary to developing pulmonary edema or an atypical infectious process. 3. Bibasilar airspace opacities favored to represent atelectasis with an infiltrate not excluded.  Hospital Course:  Gabrielle Wade a63 y.o.female,w anxiety/ depression,PTSD,  hx of epidural abscess (mrsa), w quadriplegia , Dm2, CKD stage3, Copd, Chronic Trach/ vent dependence, Gerd, chronic dysphagia, Anemia, presents with  Hgb 6.8, and wbc 0.6 per [facility].   Pt notes slight dyspnea and dry cough, but unable to tell me how long this has been going on . Pt denies fever ,chills, cp, palp, n/v, diarrhea, brbpr, black stool.   In the ED the patient was found to have Temp of 95.5 and SaO2 of 96% on 30% FIO2. CXR demonstrates scattered bilateral airspace opacities, atelectasis, and cardiomegaly with volume overload. WBC was 0.8, Hgb 6.2, and creatinine of 1.24 with Alk phos 247. FOBT was negative. Blood cultures x 2 were obtained. They have grown out MRSA from one bottle and GNR from the other. ID has been consulted as has hematology/oncology. PCCM has been consulted for management of the patient's trach and vent.  The patient is receiving IV Meropenem and Vancomycin. She has been diuresed with Lasix 20 mg IV x 1. Meropenem was discontinued on 12/30/2019 by ID as she had completed treatment for her ESBL. Vancomycin will continue through 02/02/2020  Tube feeds have been restarted. She is receiving Jevity 1.5 cal at 50 cc/hr with 250 cc free water flushes Q4hours.   Echocardiogram has been performed. It has demonsdtrated an EF of 50-55% and Grade II diastolic dysfunction.  Blood cultures (1/2) drawn on 12/25/2019 have grown out Candida parapsilosis. The patient has been started on diflucan. Diflucan will continue through 01/09/2020.  She was transfused with one unit PRBC's on 12/28/2019 due to hemoglobin of 6.7. Hemoglobin today is 7.6.   Today's assessment: S: The patient is resting comfortably. No new complaints. O: Vitals:  Vitals:   12/31/19 1000 12/31/19 1100  BP: (!) 140/54 133/60  Pulse: 81 60  Resp: 17 16  Temp:  97.6 F (36.4 C)  SpO2: 96% 94%   Exam:  Constitutional:   The patient is awake and alert. She attempts to communicate, but I am unable to  read her lips. No acute distress. Respiratory:   No increased work of breathing.  Diminished breath sounds bilaterally  No wheezes or rhonchi are appreciated.  No tactile fremitus Cardiovascular:   Regular rate and rhythm  No murmurs, ectopy, or gallups.  No lateral PMI. No thrills. Abdomen:   Abdomen is soft, non-tender, non-distended  No hernias, masses, or organomegaly  Normoactive bowel sounds.  Musculoskeletal:   No cyanosis, clubbing, or edema  Left heel is bandaged. Skin:   No rashes, lesions, ulcers  palpation of skin: no induration or nodules  Stage III ulcer left heel. Neurologic:   CN 2-12 intact  Pt is a quadriplegic Psychiatric:   Mental status ? Mood, affect appropriate  judgment and insight appear intact  Discharge Instructions  Discharge Instructions    Activity as tolerated - No restrictions   Complete by: As directed    Call MD for:  difficulty breathing, headache or visual disturbances   Complete by: As directed    Call MD for:  severe uncontrolled pain   Complete by: As directed    Call MD for:  temperature >100.4   Complete by: As directed    Diet Carb Modified   Complete by: As directed    Home infusion instructions   Complete by: As directed    Instructions: Flushing of vascular access device: 0.9% NaCl pre/post medication administration and prn patency; Heparin 100 u/ml, 66m for implanted ports and Heparin 10u/ml, 569mfor all other central venous catheters.     Allergies as of 12/31/2019      Reactions   Codeine    Hydrocodone    Influenza A (h1n1) Monovalent Vaccine    Latex    Morphine And Related    Penicillins    Shellfish Allergy    Tape       Medication List    STOP taking these medications   cephALEXin 500 MG capsule Commonly known as: KEFLEX   famotidine 20 MG tablet Commonly known as: PEPCID   insulin NPH Human 100 UNIT/ML injection Commonly known as: NOVOLIN N   oxyCODONE-acetaminophen 5-325 MG  tablet Commonly known as: PERCOCET/ROXICET     TAKE these medications   acetaminophen 325 MG tablet Commonly known as: TYLENOL Take 2 tablets (650 mg total) by mouth every 6 (six) hours as needed for mild pain (or Fever >/= 101).   budesonide 0.5 MG/2ML nebulizer solution Commonly known as: PULMICORT Take 0.5 mg by nebulization 2 (two) times daily.   chlorhexidine 0.12 % solution Commonly known as: PERIDEX Use as directed 15 mLs in the mouth or throat See admin instructions. By shift starting   feeding supplement (PRO-STAT SUGAR FREE 64) Liqd Place 30 mLs into feeding tube 3 (three) times daily.   ferrous sulfate 300 (60 Fe) MG/5ML syrup Place 5 mLs (300 mg total) into feeding tube daily with breakfast. Start taking on: January 01, 2020   fluconazole  IVPB Commonly known as: DIFLUCAN Inject 400 mg into the vein daily for 11 days. Indication:  Candida bacteremia  Last Day of Therapy:  01/10/2020 Labs - Once weekly:  CBC/D and BMP, Labs - Every other week:  ESR and CRP  free water Soln Place 250 mLs into feeding tube every 4 (four) hours.   Gerhardt's butt cream Crea Apply 1 application topically 4 (four) times daily.   insulin aspart 100 UNIT/ML injection Commonly known as: novoLOG Inject 5 Units into the skin every 4 (four) hours.   insulin glargine 100 UNIT/ML injection Commonly known as: LANTUS Inject 0.06 mLs (6 Units total) into the skin daily. Start taking on: January 01, 2020   insulin lispro 100 UNIT/ML injection Commonly known as: HUMALOG Inject 3-12 Units into the skin See admin instructions. < 60 or > 400 notify Dr; 150 - 200, 3 units; 201 - 250, 6 units; 251 - 300 8 units; 301 - 350, 12 units   ipratropium-albuterol 0.5-2.5 (3) MG/3ML Soln Commonly known as: DUONEB Take 3 mLs by nebulization every 4 (four) hours as needed (shortness of breath).   ISOSOURCE 1.5 CAL PO Take 55 mL/hr by mouth See admin instructions. By shift starting   levothyroxine 25  MCG tablet Commonly known as: SYNTHROID Take 1 tablet (25 mcg total) by mouth daily at 6 (six) AM. Start taking on: January 01, 2020   LORazepam 2 MG/ML concentrated solution Commonly known as: ATIVAN Place 0.5 mLs (1 mg total) into feeding tube every 4 (four) hours as needed for anxiety. What changed:   how much to take  how to take this  when to take this   nystatin 100000 UNIT/ML suspension Commonly known as: MYCOSTATIN Take 5 mLs (500,000 Units total) by mouth 4 (four) times daily -  before meals and at bedtime.   oxyCODONE 5 MG immediate release tablet Commonly known as: Oxy IR/ROXICODONE Take 1 tablet (5 mg total) by mouth every 4 (four) hours as needed for moderate pain or severe pain.   pantoprazole sodium 40 mg/20 mL Pack Commonly known as: PROTONIX Place 20 mLs (40 mg total) into feeding tube daily at 12 noon. Start taking on: January 01, 2020   rifampin 60 mg/mL Susp oral suspension Commonly known as: RIFADIN Place 10 mLs (600 mg total) into feeding tube daily at 6 PM.   traZODone 50 MG tablet Commonly known as: DESYREL Take 50 mg by mouth at bedtime.   vancomycin  IVPB Inject 1,000 mg into the vein every other day. Indication:  MRSA bacteremia Last Day of Therapy:  02/02/2020 Labs - Sunday/Monday:  CBC/D, BMP, and vancomycin trough. Labs - Thursday:  BMP and vancomycin trough Labs - Every other week:  ESR and CRP            Home Infusion Instuctions  (From admission, onward)         Start     Ordered   12/31/19 0000  Home infusion instructions    Question:  Instructions  Answer:  Flushing of vascular access device: 0.9% NaCl pre/post medication administration and prn patency; Heparin 100 u/ml, 81m for implanted ports and Heparin 10u/ml, 511mfor all other central venous catheters.   12/31/19 1244         Allergies  Allergen Reactions  . Codeine   . Hydrocodone   . Influenza A (H1n1) Monovalent Vaccine   . Latex   . Morphine And Related    . Penicillins   . Shellfish Allergy   . Tape     The results of significant diagnostics from this hospitalization (including imaging, microbiology, ancillary and laboratory) are listed below for reference.    Significant Diagnostic Studies: CT CHEST WO CONTRAST  Result Date: 12/24/2019 CLINICAL DATA:  Shortness  of breath and chest pain EXAM: CT CHEST WITHOUT CONTRAST TECHNIQUE: Multidetector CT imaging of the chest was performed following the standard protocol without IV contrast. COMPARISON:  Chest x-ray from the previous day. FINDINGS: Cardiovascular: Somewhat limited due to lack of IV contrast. Aortic calcifications are seen without aneurysmal dilatation. No significant cardiac enlargement is seen. Implantable pacemaker is noted within the right ventricle. No significant coronary calcifications are noted. Mediastinum/Nodes: Thoracic inlet demonstrates a tracheostomy tube in satisfactory position. No focal mass lesion is seen. Scattered small mediastinal and hilar lymph nodes are seen likely reactive in nature given the findings in the lungs. The esophagus appears within normal limits. Lungs/Pleura: Large pleural effusions are noted bilaterally right slightly greater than left with associated lower lobe consolidation. Changes of mucous plugging are noted within the lower lobe bronchial tree on the left. No large central mass is seen. The upper lobes demonstrates some posterior dependent atelectatic changes. Small apical nodules are noted bilaterally. The largest of these is seen in the right upper lobe measuring approximately 6 mm. Upper Abdomen: Visualized upper abdomen shows no acute abnormality Musculoskeletal: Multilevel degenerative changes of the thoracic spine are seen. No acute bony abnormality is noted. Postsurgical changes in the cervicothoracic spine are seen. IMPRESSION: Large bilateral pleural effusions with underlying consolidation in the lower lobes and upper lobe atelectatic changes.  These changes are consistent with bilateral pneumonia. Reactive lymphadenopathy is seen. Small nodular densities within the apices bilaterally. Non-contrast chest CT at 3-6 months is recommended. If the nodules are stable at time of repeat CT, then future CT at 18-24 months (from today's scan) is considered optional for low-risk patients, but is recommended for high-risk patients. This recommendation follows the consensus statement: Guidelines for Management of Incidental Pulmonary Nodules Detected on CT Images: From the Fleischner Society 2017; Radiology 2017; 284:228-243. Aortic Atherosclerosis (ICD10-I70.0). Electronically Signed   By: Inez Catalina M.D.   On: 12/24/2019 17:30   DG CHEST PORT 1 VIEW  Result Date: 12/28/2019 CLINICAL DATA:  Respiratory crackles. EXAM: PORTABLE CHEST 1 VIEW COMPARISON:  Chest CT 12/24/2019 and chest radiograph 12/23/2019 FINDINGS: Tracheostomy tube overlies the airway. The cardiac silhouette remains mildly enlarged. Veiling opacities in both lower lungs are similar to the prior radiograph and consistent with right larger than left pleural effusions. Patchy airspace opacities in the perihilar regions and upper lobes have improved. Right greater than left lower lung airspace opacities have at most slightly improved on the left. No pneumothorax is identified. IMPRESSION: 1. Persistent right larger than left pleural effusions with basilar atelectasis or consolidation. 2. Mildly improved perihilar and upper lung opacities which may reflect decreased edema or pneumonia. Electronically Signed   By: Logan Bores M.D.   On: 12/28/2019 08:38   DG Chest Port 1 View  Result Date: 12/23/2019 CLINICAL DATA:  Sepsis EXAM: PORTABLE CHEST 1 VIEW COMPARISON:  None. FINDINGS: There are moderate to large bilateral pleural effusions. The heart size is enlarged. There is scattered bilateral airspace opacities. There are prominent interstitial lung markings. There are dense bibasilar airspace  opacities. There is no pneumothorax. There are old healed left-sided rib fractures. IMPRESSION: 1. Cardiomegaly with volume overload including moderate bilateral pleural effusions. 2. Scattered bilateral airspace opacities may be secondary to developing pulmonary edema or an atypical infectious process. 3. Bibasilar airspace opacities favored to represent atelectasis with an infiltrate not excluded. Electronically Signed   By: Constance Holster M.D.   On: 12/23/2019 17:25   DG Foot 2 Views Left  Result Date:  12/25/2019 CLINICAL DATA:  64 year old female with bacteremia and left heel wound. Query osteomyelitis. EXAM: LEFT FOOT - 2 VIEW COMPARISON:  None. FINDINGS: The calcaneus appears intact with degenerative spurring. There is soft tissue swelling in the left foot maximal at the metatarsal region. Calcified peripheral vascular disease. No soft tissue gas. There is a tiny 2-3 millimeter nonspecific density or dystrophic calcification along the plantar surface of the foot at the level of the metatarsals (arrow). No acute fracture or cortical osteolysis identified. There is a healed fracture of the 5th metatarsal. IMPRESSION: 1. Soft tissue swelling with no plain radiographic evidence of osteomyelitis. 2. Calcified peripheral vascular disease. Tiny 2-3 millimeter nonspecific foreign body or dystrophic calcification in the plantar soft tissues (arrow). 3. Healed 5th metatarsal fracture. Electronically Signed   By: Genevie Ann M.D.   On: 12/25/2019 15:10   ECHOCARDIOGRAM COMPLETE  Result Date: 12/24/2019   ECHOCARDIOGRAM REPORT   Patient Name:   Anie Pergola Date of Exam: 12/24/2019 Medical Rec #:  [id number]      Height:       70.0 in Accession #:    [id number]     Weight:       201.0 lb Date of Birth:  [birth year]     BSA:          2.09 m Patient Age:    19 years       BP:           118/43 mmHg Patient Gender: F              HR:           69 bpm. Exam Location:  Inpatient Procedure: 2D Echo Indications:    Dyspnea  R06.00  History:        Patient has no prior history of Echocardiogram examinations.                 COPD; Risk Factors:Diabetes.  Sonographer:    Mikki Santee RDCS (AE) Referring Phys: Sharon  1. Left ventricular ejection fraction, by visual estimation, is 55 to 60%. The left ventricle has normal function. There is no left ventricular hypertrophy.  2. Left ventricular diastolic parameters are consistent with Grade II diastolic dysfunction (pseudonormalization).  3. Elevated left ventricular end-diastolic pressure.  4. Global right ventricle has normal systolic function.The right ventricular size is normal. No increase in right ventricular wall thickness.  5. Left atrial size was normal.  6. Right atrial size was normal.  7. The mitral valve is normal in structure. Mild mitral valve regurgitation. No evidence of mitral stenosis.  8. The tricuspid valve is normal in structure.  9. The aortic valve was not well visualized. Aortic valve regurgitation is not visualized. No evidence of aortic valve sclerosis or stenosis. 10. The pulmonic valve was normal in structure. Pulmonic valve regurgitation is trivial. 11. Normal pulmonary artery systolic pressure. 12. The inferior vena cava is dilated in size with >50% respiratory variability, suggesting right atrial pressure of 8 mmHg. FINDINGS  Left Ventricle: Left ventricular ejection fraction, by visual estimation, is 55 to 60%. The left ventricle has normal function. The left ventricle is not well visualized. There is no left ventricular hypertrophy. Left ventricular diastolic parameters are consistent with Grade II diastolic dysfunction (pseudonormalization). Elevated left ventricular end-diastolic pressure. Right Ventricle: The right ventricular size is normal. No increase in right ventricular wall thickness. Global RV systolic function is has normal systolic function. The tricuspid regurgitant velocity is 1.83 m/s,  and with an assumed right atrial  pressure  of 8 mmHg, the estimated right ventricular systolic pressure is normal at 21.4 mmHg. Left Atrium: Left atrial size was normal in size. Right Atrium: Right atrial size was normal in size Pericardium: There is no evidence of pericardial effusion. Mitral Valve: The mitral valve is normal in structure. Mild mitral valve regurgitation. No evidence of mitral valve stenosis by observation. Tricuspid Valve: The tricuspid valve is normal in structure. Tricuspid valve regurgitation is trivial. Aortic Valve: The aortic valve was not well visualized. Aortic valve regurgitation is not visualized. The aortic valve is structurally normal, with no evidence of sclerosis or stenosis. Pulmonic Valve: The pulmonic valve was normal in structure. Pulmonic valve regurgitation is trivial. Pulmonic regurgitation is trivial. Aorta: The aortic root, ascending aorta and aortic arch are all structurally normal, with no evidence of dilitation or obstruction. Venous: The inferior vena cava is dilated in size with greater than 50% respiratory variability, suggesting right atrial pressure of 8 mmHg. IAS/Shunts: No atrial level shunt detected by color flow Doppler. There is no evidence of a patent foramen ovale. No ventricular septal defect is seen or detected. There is no evidence of an atrial septal defect.  LEFT VENTRICLE PLAX 2D LVIDd:         5.20 cm  Diastology LVIDs:         3.50 cm  LV e' lateral:   6.42 cm/s LV PW:         0.70 cm  LV E/e' lateral: 19.0 LV IVS:        0.80 cm  LV e' medial:    6.09 cm/s LVOT diam:     2.10 cm  LV E/e' medial:  20.0 LV SV:         79 ml LV SV Index:   36.71 LVOT Area:     3.46 cm  RIGHT VENTRICLE RV S prime:     11.00 cm/s TAPSE (M-mode): 2.2 cm LEFT ATRIUM             Index       RIGHT ATRIUM           Index LA diam:        4.10 cm 1.96 cm/m  RA Area:     16.40 cm LA Vol (A2C):   37.2 ml 17.78 ml/m RA Volume:   42.20 ml  20.17 ml/m LA Vol (A4C):   33.8 ml 16.16 ml/m LA Biplane Vol: 37.9 ml  18.12 ml/m  AORTIC VALVE LVOT Vmax:   90.50 cm/s LVOT Vmean:  57.500 cm/s LVOT VTI:    0.200 m  AORTA Ao Root diam: 3.10 cm MITRAL VALVE                         TRICUSPID VALVE MV Area (PHT): 3.65 cm              TR Peak grad:   13.4 mmHg MV PHT:        60.32 msec            TR Vmax:        183.00 cm/s MV Decel Time: 208 msec MV E velocity: 122.00 cm/s 103 cm/s  SHUNTS MV A velocity: 98.80 cm/s  70.3 cm/s Systemic VTI:  0.20 m MV E/A ratio:  1.23        1.5       Systemic Diam: 2.10 cm  Cherlynn Kaiser MD Electronically signed by  Cherlynn Kaiser MD Signature Date/Time: 12/24/2019/8:36:20 PM    Final    VAS [place] UPPER EXTREMITY VENOUS DUPLEX  Result Date: 12/25/2019 UPPER VENOUS STUDY  Indications: Swelling Risk Factors: None identified. Limitations: Poor ultrasound/tissue interface, body habitus, bandages and patient immobility. Comparison Study: No prior studies. Performing Technologist: Oliver Hum RVT  Examination Guidelines: A complete evaluation includes B-mode imaging, spectral Doppler, color Doppler, and power Doppler as needed of all accessible portions of each vessel. Bilateral testing is considered an integral part of a complete examination. Limited examinations for reoccurring indications may be performed as noted.  Right Findings: +----------+------------+---------+-----------+----------+-------+ RIGHT     CompressiblePhasicitySpontaneousPropertiesSummary +----------+------------+---------+-----------+----------+-------+ IJV           Full       Yes       Yes                      +----------+------------+---------+-----------+----------+-------+ Subclavian    Full       Yes       Yes                      +----------+------------+---------+-----------+----------+-------+ Axillary      Full       Yes       Yes                      +----------+------------+---------+-----------+----------+-------+ Brachial      Full       Yes       Yes                       +----------+------------+---------+-----------+----------+-------+ Radial        Full                                          +----------+------------+---------+-----------+----------+-------+ Ulnar         Full                                          +----------+------------+---------+-----------+----------+-------+ Cephalic      Full                                          +----------+------------+---------+-----------+----------+-------+ Basilic       Full                                          +----------+------------+---------+-----------+----------+-------+  Left Findings: +----------+------------+---------+-----------+----------+-------+ LEFT      CompressiblePhasicitySpontaneousPropertiesSummary +----------+------------+---------+-----------+----------+-------+ Subclavian    Full       Yes       Yes                      +----------+------------+---------+-----------+----------+-------+  Summary:  Right: No evidence of deep vein thrombosis in the upper extremity. No evidence of superficial vein thrombosis in the upper extremity.  Left: No evidence of thrombosis in the subclavian.  *See table(s) above for measurements and observations.  Diagnosing physician: Curt Jews MD Electronically signed by Curt Jews MD on 12/25/2019 at 4:40:08 PM.  Final     Microbiology: Recent Results (from the past 240 hour(s))  Blood culture (routine x 2)     Status: Abnormal   Collection Time: 12/23/19  4:25 PM   Specimen: BLOOD RIGHT ARM  Result Value Ref Range Status   Specimen Description BLOOD RIGHT ARM  Final   Special Requests   Final    BOTTLES DRAWN AEROBIC AND ANAEROBIC Blood Culture adequate volume   Culture  Setup Time   Final    GRAM POSITIVE COCCI IN BOTH AEROBIC AND ANAEROBIC BOTTLES CRITICAL VALUE NOTED.  VALUE IS CONSISTENT WITH PREVIOUSLY REPORTED AND CALLED VALUE.    Culture (A)  Final    STAPHYLOCOCCUS AUREUS SUSCEPTIBILITIES PERFORMED ON PREVIOUS  CULTURE WITHIN THE LAST 5 DAYS. Performed at [facility], [place] [address] [place], [place] [address]    Report Status 12/26/2019 FINAL  Final  Respiratory Panel by RT PCR (Flu A&B, Covid) - Nasopharyngeal Swab     Status: None   Collection Time: 12/23/19  4:27 PM   Specimen: Nasopharyngeal Swab  Result Value Ref Range Status   SARS Coronavirus 2 by RT PCR NEGATIVE NEGATIVE Final    Comment: (NOTE) SARS-CoV-2 target nucleic acids are NOT DETECTED. The SARS-CoV-2 RNA is generally detectable in upper respiratoy specimens during the acute phase of infection. The lowest concentration of SARS-CoV-2 viral copies this assay can detect is 131 copies/mL. A negative result does not preclude SARS-Cov-2 infection and should not be used as the sole basis for treatment or other patient management decisions. A negative result may occur with  improper specimen collection/handling, submission of specimen other than nasopharyngeal swab, presence of viral mutation(s) within the areas targeted by this assay, and inadequate number of viral copies (<131 copies/mL). A negative result must be combined with clinical observations, patient history, and epidemiological information. The expected result is Negative. Fact Sheet for Patients:  PinkCheek.be Fact Sheet for Healthcare Providers:  GravelBags.it This test is not yet ap proved or cleared by the [place] FDA and  has been authorized for detection and/or diagnosis of SARS-CoV-2 by FDA under an Emergency Use Authorization (EUA). This EUA will remain  in effect (meaning this test can be used) for the duration of the COVID-19 declaration under Section 564(b)(1) of the Act, 21 U.S.C. section 360bbb-3(b)(1), unless the authorization is terminated or revoked sooner.    Influenza A by PCR NEGATIVE NEGATIVE Final   Influenza B by PCR NEGATIVE NEGATIVE Final    Comment: (NOTE) The Xpert Xpress  SARS-CoV-2/FLU/RSV assay is intended as an aid in  the diagnosis of influenza from Nasopharyngeal swab specimens and  should not be used as a sole basis for treatment. Nasal washings and  aspirates are unacceptable for Xpert Xpress SARS-CoV-2/FLU/RSV  testing. Fact Sheet for Patients: PinkCheek.be Fact Sheet for Healthcare Providers: GravelBags.it This test is not yet approved or cleared by the [place] FDA and  has been authorized for detection and/or diagnosis of SARS-CoV-2 by  FDA under an Emergency Use Authorization (EUA). This EUA will remain  in effect (meaning this test can be used) for the duration of the  Covid-19 declaration under Section 564(b)(1) of the Act, 21  U.S.C. section 360bbb-3(b)(1), unless the authorization is  terminated or revoked. Performed at [facility], [place] [address] [place], [place] [address]   Urine culture     Status: Abnormal   Collection Time: 12/23/19  4:51 PM   Specimen: In/Out Cath Urine  Result Value Ref  Range Status   Specimen Description IN/OUT CATH URINE  Final   Special Requests   Final    NONE Performed at [facility], [place] [address], [place], [place] [address]    Culture (A)  Final    >=100,000 COLONIES/mL KLEBSIELLA PNEUMONIAE >=100,000 COLONIES/mL ESCHERICHIA COLI Confirmed Extended Spectrum Beta-Lactamase Producer (ESBL).  In bloodstream infections from ESBL organisms, carbapenems are preferred over piperacillin/tazobactam. They are shown to have a lower risk of mortality.    Report Status 12/25/2019 FINAL  Final   Organism ID, Bacteria KLEBSIELLA PNEUMONIAE (A)  Final   Organism ID, Bacteria ESCHERICHIA COLI (A)  Final      Susceptibility   Escherichia coli - MIC*    AMPICILLIN >=32 RESISTANT Resistant     CEFAZOLIN >=64 RESISTANT Resistant     CEFTRIAXONE >=64 RESISTANT Resistant     CIPROFLOXACIN >=4 RESISTANT Resistant     GENTAMICIN <=1 SENSITIVE  Sensitive     IMIPENEM <=0.25 SENSITIVE Sensitive     NITROFURANTOIN <=16 SENSITIVE Sensitive     TRIMETH/SULFA <=20 SENSITIVE Sensitive     AMPICILLIN/SULBACTAM >=32 RESISTANT Resistant     PIP/TAZO 32 INTERMEDIATE Intermediate     * >=100,000 COLONIES/mL ESCHERICHIA COLI   Klebsiella pneumoniae - MIC*    AMPICILLIN >=32 RESISTANT Resistant     CEFAZOLIN <=4 SENSITIVE Sensitive     CEFTRIAXONE <=0.25 SENSITIVE Sensitive     CIPROFLOXACIN <=0.25 SENSITIVE Sensitive     GENTAMICIN <=1 SENSITIVE Sensitive     IMIPENEM <=0.25 SENSITIVE Sensitive     NITROFURANTOIN 128 RESISTANT Resistant     TRIMETH/SULFA <=20 SENSITIVE Sensitive     AMPICILLIN/SULBACTAM 16 INTERMEDIATE Intermediate     PIP/TAZO <=4 SENSITIVE Sensitive     * >=100,000 COLONIES/mL KLEBSIELLA PNEUMONIAE  Blood culture (routine x 2)     Status: Abnormal   Collection Time: 12/23/19  4:58 PM   Specimen: BLOOD  Result Value Ref Range Status   Specimen Description BLOOD RIGHT THUMB  Final   Special Requests   Final    BOTTLES DRAWN AEROBIC AND ANAEROBIC Blood Culture adequate volume   Culture  Setup Time   Final    GRAM NEGATIVE RODS IN BOTH AEROBIC AND ANAEROBIC BOTTLES GRAM POSITIVE COCCI CRITICAL RESULT CALLED TO, READ BACK BY AND VERIFIED WITH: J. FRENS PHARMD, AT 0820 12/24/19 BY D.V [facility] Performed at [facility], [place] [address], [place], [place] [address]    Culture (A)  Final    ESCHERICHIA COLI STAPHYLOCOCCUS AUREUS Confirmed Extended Spectrum Beta-Lactamase Producer (ESBL).  In bloodstream infections from ESBL organisms, carbapenems are preferred over piperacillin/tazobactam. They are shown to have a lower risk of mortality.    Report Status 12/26/2019 FINAL  Final   Organism ID, Bacteria ESCHERICHIA COLI  Final   Organism ID, Bacteria STAPHYLOCOCCUS AUREUS  Final      Susceptibility   Escherichia coli - MIC*    AMPICILLIN >=32 RESISTANT Resistant     CEFAZOLIN >=64 RESISTANT Resistant     CEFEPIME  16 RESISTANT Resistant     CEFTAZIDIME RESISTANT Resistant     CEFTRIAXONE >=64 RESISTANT Resistant     CIPROFLOXACIN >=4 RESISTANT Resistant     GENTAMICIN <=1 SENSITIVE Sensitive     IMIPENEM <=0.25 SENSITIVE Sensitive     TRIMETH/SULFA <=20 SENSITIVE Sensitive     AMPICILLIN/SULBACTAM 16 INTERMEDIATE Intermediate     PIP/TAZO <=4 SENSITIVE Sensitive     * ESCHERICHIA COLI   Staphylococcus aureus - MIC*  CIPROFLOXACIN >=8 RESISTANT Resistant     ERYTHROMYCIN >=8 RESISTANT Resistant     GENTAMICIN <=0.5 SENSITIVE Sensitive     OXACILLIN >=4 RESISTANT Resistant     TETRACYCLINE >=16 RESISTANT Resistant     VANCOMYCIN <=0.5 SENSITIVE Sensitive     TRIMETH/SULFA <=10 SENSITIVE Sensitive     CLINDAMYCIN <=0.25 SENSITIVE Sensitive     RIFAMPIN <=0.5 SENSITIVE Sensitive     Inducible Clindamycin NEGATIVE Sensitive     * STAPHYLOCOCCUS AUREUS  Blood Culture ID Panel (Reflexed)     Status: Abnormal   Collection Time: 12/23/19  4:58 PM  Result Value Ref Range Status   Enterococcus species NOT DETECTED NOT DETECTED Final   Listeria monocytogenes NOT DETECTED NOT DETECTED Final   Staphylococcus species DETECTED (A) NOT DETECTED Final    Comment: CRITICAL RESULT CALLED TO, READ BACK BY AND VERIFIED WITH: J. FRENS PHARMD, AT 2297 12/24/19 BY D. VANHOOK    Staphylococcus aureus (BCID) DETECTED (A) NOT DETECTED Final    Comment: Methicillin (oxacillin)-resistant Staphylococcus aureus (MRSA). MRSA is predictably resistant to beta-lactam antibiotics (except ceftaroline). Preferred therapy is vancomycin unless clinically contraindicated. Patient requires contact precautions if  hospitalized. CRITICAL RESULT CALLED TO, READ BACK BY AND VERIFIED WITH: J. FRENS PHARMD, AT 9892 12/24/19 BY D. VANHOOK    Methicillin resistance DETECTED (A) NOT DETECTED Final    Comment: CRITICAL RESULT CALLED TO, READ BACK BY AND VERIFIED WITH: J. FRENS PHARMD, AT 1194 12/24/19 BY D. VANHOOK    Streptococcus species  NOT DETECTED NOT DETECTED Final   Streptococcus agalactiae NOT DETECTED NOT DETECTED Final   Streptococcus pneumoniae NOT DETECTED NOT DETECTED Final   Streptococcus pyogenes NOT DETECTED NOT DETECTED Final   Acinetobacter baumannii NOT DETECTED NOT DETECTED Final   Enterobacteriaceae species DETECTED (A) NOT DETECTED Final    Comment: Enterobacteriaceae represent a large family of gram-negative bacteria, not a single organism. CRITICAL RESULT CALLED TO, READ BACK BY AND VERIFIED WITH: J. FRENS PHARMD, AT 1740 12/24/19 BY D. VANHOOK    Enterobacter cloacae complex NOT DETECTED NOT DETECTED Final   Escherichia coli DETECTED (A) NOT DETECTED Final    Comment: CRITICAL RESULT CALLED TO, READ BACK BY AND VERIFIED WITH: J. FRENS PHARMD, AT 8144 12/24/19 BY D. VANHOOK    Klebsiella oxytoca NOT DETECTED NOT DETECTED Final   Klebsiella pneumoniae NOT DETECTED NOT DETECTED Final   Proteus species NOT DETECTED NOT DETECTED Final   Serratia marcescens NOT DETECTED NOT DETECTED Final   Carbapenem resistance NOT DETECTED NOT DETECTED Final   Haemophilus influenzae NOT DETECTED NOT DETECTED Final   Neisseria meningitidis NOT DETECTED NOT DETECTED Final   Pseudomonas aeruginosa NOT DETECTED NOT DETECTED Final   Candida albicans NOT DETECTED NOT DETECTED Final   Candida glabrata NOT DETECTED NOT DETECTED Final   Candida krusei NOT DETECTED NOT DETECTED Final   Candida parapsilosis NOT DETECTED NOT DETECTED Final   Candida tropicalis NOT DETECTED NOT DETECTED Final    Comment: Performed at [facility], [place] [address], [place], [place] [address]  MRSA PCR Screening     Status: Abnormal   Collection Time: 12/24/19  5:50 PM   Specimen: Nasal Mucosa; Nasopharyngeal  Result Value Ref Range Status   MRSA by PCR POSITIVE (A) NEGATIVE Final    Comment:        The GeneXpert MRSA Assay (FDA approved for NASAL specimens only), is one component of a comprehensive MRSA colonization surveillance program.  It is not intended to diagnose  MRSA infection nor to guide or monitor treatment for MRSA infections. RESULT CALLED TO, READ BACK BY AND VERIFIED WITH: Everlean Alstrom RN 2017 12/24/19 A BROWNING Performed at [facility], [place] [address] [place], [place] [address]   Culture, blood (routine x 2)     Status: Abnormal   Collection Time: 12/25/19 10:26 AM   Specimen: BLOOD RIGHT HAND  Result Value Ref Range Status   Specimen Description BLOOD RIGHT HAND  Final   Special Requests   Final    BOTTLES DRAWN AEROBIC ONLY Blood Culture adequate volume   Culture  Setup Time   Final    AEROBIC BOTTLE ONLY YEAST CRITICAL RESULT CALLED TO, READ BACK BY AND VERIFIED WITH: Carmelia Bake [facility] 12/26/19 2340 JDW Performed at [facility], [place] [address] [place], [place] [address]    Culture CANDIDA PARAPSILOSIS (A)  Final   Report Status 12/28/2019 FINAL  Final  Blood Culture ID Panel (Reflexed)     Status: Abnormal   Collection Time: 12/25/19 10:26 AM  Result Value Ref Range Status   Enterococcus species NOT DETECTED NOT DETECTED Final   Listeria monocytogenes NOT DETECTED NOT DETECTED Final   Staphylococcus species NOT DETECTED NOT DETECTED Final   Staphylococcus aureus (BCID) NOT DETECTED NOT DETECTED Final   Streptococcus species NOT DETECTED NOT DETECTED Final   Streptococcus agalactiae NOT DETECTED NOT DETECTED Final   Streptococcus pneumoniae NOT DETECTED NOT DETECTED Final   Streptococcus pyogenes NOT DETECTED NOT DETECTED Final   Acinetobacter baumannii NOT DETECTED NOT DETECTED Final   Enterobacteriaceae species NOT DETECTED NOT DETECTED Final   Enterobacter cloacae complex NOT DETECTED NOT DETECTED Final   Escherichia coli NOT DETECTED NOT DETECTED Final   Klebsiella oxytoca NOT DETECTED NOT DETECTED Final   Klebsiella pneumoniae NOT DETECTED NOT DETECTED Final   Proteus species NOT DETECTED NOT DETECTED Final   Serratia marcescens NOT DETECTED NOT DETECTED Final   Haemophilus  influenzae NOT DETECTED NOT DETECTED Final   Neisseria meningitidis NOT DETECTED NOT DETECTED Final   Pseudomonas aeruginosa NOT DETECTED NOT DETECTED Final   Candida albicans NOT DETECTED NOT DETECTED Final   Candida glabrata NOT DETECTED NOT DETECTED Final   Candida krusei NOT DETECTED NOT DETECTED Final   Candida parapsilosis DETECTED (A) NOT DETECTED Final    Comment: CRITICAL RESULT CALLED TO, READ BACK BY AND VERIFIED WITH: K HAMMONS PHARMD 12/26/19 2340 JDW    Candida tropicalis NOT DETECTED NOT DETECTED Final    Comment: Performed at [facility], [place] [address], [place], [place] [address]  Culture, blood (routine x 2)     Status: Abnormal   Collection Time: 12/25/19 10:34 AM   Specimen: BLOOD RIGHT HAND  Result Value Ref Range Status   Specimen Description BLOOD RIGHT HAND  Final   Special Requests   Final    BOTTLES DRAWN AEROBIC ONLY Blood Culture adequate volume   Culture  Setup Time   Final    AEROBIC BOTTLE ONLY YEAST CRITICAL RESULT CALLED TO, READ BACK BY AND VERIFIED WITH: K HAMMONS PHARMD 12/26/19 2340 JDW    Culture (A)  Final    CANDIDA PARAPSILOSIS STAPHYLOCOCCUS AUREUS SUSCEPTIBILITIES PERFORMED ON PREVIOUS CULTURE WITHIN THE LAST 5 DAYS. Performed at [facility], [place] [address] [place], [place] [address]    Report Status 12/29/2019 FINAL  Final  Culture, blood (routine x 2)     Status: None (Preliminary result)   Collection Time: 12/27/19  9:20  AM   Specimen: BLOOD RIGHT ARM  Result Value Ref Range Status   Specimen Description BLOOD RIGHT ARM  Final   Special Requests   Final    BOTTLES DRAWN AEROBIC AND ANAEROBIC Blood Culture results may not be optimal due to an inadequate volume of blood received in culture bottles   Culture   Final    NO GROWTH 3 DAYS Performed at [facility], [place] [address], [place], [place] [address]    Report Status PENDING  Incomplete  Culture, blood (routine x 2)     Status: None (Preliminary result)    Collection Time: 12/27/19  9:30 AM   Specimen: BLOOD LEFT HAND  Result Value Ref Range Status   Specimen Description BLOOD LEFT HAND  Final   Special Requests   Final    BOTTLES DRAWN AEROBIC ONLY Blood Culture adequate volume   Culture   Final    NO GROWTH 3 DAYS Performed at [facility], [place] [address], [place], [place] [address]    Report Status PENDING  Incomplete     Labs: Basic Metabolic Panel: Recent Labs  Lab 12/27/19 0335 12/28/19 6553 12/29/19 0405 12/30/19 0458 12/31/19 0426 12/31/19 1037  NA 147* 147* 149* 151* 151* 152*  K 4.0 3.5 4.0 4.3 4.2 4.4  CL 111 111 111 114* 113*  --   CO2 29 32 31 33* 34*  --   GLUCOSE 193* 280* 132* 128* 122*  --   BUN 95* 99* 104* 105* 104*  --   CREATININE 1.28* 1.22* 0.97 1.01* 1.02*  --   CALCIUM 8.4* 8.5* 8.7* 9.1 9.3  --    Liver Function Tests: Recent Labs  Lab 12/25/19 0129 12/28/19 0638 12/30/19 0458 12/31/19 0426  AST 14* 15 17 16   ALT 33 25 24 25   ALKPHOS 175* 185* 183* 184*  BILITOT 0.7 1.0 1.0 0.6  PROT 6.6 6.4* 7.0 7.0  ALBUMIN 1.3* 1.1* 1.3* 1.2*   No results for input(s): LIPASE, AMYLASE in the last 168 hours. No results for input(s): AMMONIA in the last 168 hours. CBC: Recent Labs  Lab 12/25/19 0129 12/26/19 0251 12/27/19 0335 12/28/19 7482 12/28/19 2035 12/29/19 0405 12/30/19 0458 12/31/19 0426 12/31/19 1037  WBC 0.8* 0.9* 1.2* 0.6*  --  1.8* 2.8* 2.9*  --   NEUTROABS 0.0* 0.0*  --   --   --   --   --   --   --   HGB 7.1* 7.0* 7.2* 6.7* 7.6* 7.4* 7.5* 7.6* 8.5*  HCT 23.5* 23.5* 24.5* 22.3* 25.1* 24.2* 26.5* 26.7* 25.0*  MCV 97.1 97.5 100.4* 99.1  --  96.8 100.8* 101.9*  --   PLT 207 209 185 128*  --  148* 155 164  --    Cardiac Enzymes: No results for input(s): CKTOTAL, CKMB, CKMBINDEX, TROPONINI in the last 168 hours. BNP: BNP (last 3 results) No results for input(s): BNP in the last 8760 hours.  ProBNP (last 3 results) No results for input(s): PROBNP in the last 8760  hours.  CBG: Recent Labs  Lab 12/30/19 1944 12/30/19 2339 12/31/19 0333 12/31/19 0753 12/31/19 1148  GLUCAP 144* 138* 99 121* 173*    Principal Problem:   MRSA bacteremia Active Problems:   Anemia   Neutropenia (HCC)   Quadriplegia (HCC)   Diabetes (HCC)   E coli bacteremia   Pressure ulcer of left heel, stage 3 (HCC)   Edema of right upper arm   Lead-less pacemaker  Candidemia ([place])   Chronic respiratory failure ([place])   Time coordinating discharge: 38 minutes  Signed:        Ava Swayze, DO [facility]  12/31/2019, 12:45 PM

## 2020-01-01 ENCOUNTER — Inpatient Hospital Stay (HOSPITAL_COMMUNITY): Payer: Medicare PPO

## 2020-01-01 LAB — TYPE AND SCREEN
ABO/RH(D): O POS
Antibody Screen: NEGATIVE
Donor AG Type: NEGATIVE
Donor AG Type: NEGATIVE
Unit division: 0
Unit division: 0

## 2020-01-01 LAB — GLUCOSE, CAPILLARY
Glucose-Capillary: 105 mg/dL — ABNORMAL HIGH (ref 70–99)
Glucose-Capillary: 105 mg/dL — ABNORMAL HIGH (ref 70–99)
Glucose-Capillary: 108 mg/dL — ABNORMAL HIGH (ref 70–99)
Glucose-Capillary: 116 mg/dL — ABNORMAL HIGH (ref 70–99)
Glucose-Capillary: 132 mg/dL — ABNORMAL HIGH (ref 70–99)
Glucose-Capillary: 174 mg/dL — ABNORMAL HIGH (ref 70–99)
Glucose-Capillary: 69 mg/dL — ABNORMAL LOW (ref 70–99)
Glucose-Capillary: 74 mg/dL (ref 70–99)

## 2020-01-01 LAB — BASIC METABOLIC PANEL
Anion gap: 6 (ref 5–15)
BUN: 101 mg/dL — ABNORMAL HIGH (ref 8–23)
CO2: 33 mmol/L — ABNORMAL HIGH (ref 22–32)
Calcium: 9.3 mg/dL (ref 8.9–10.3)
Chloride: 108 mmol/L (ref 98–111)
Creatinine, Ser: 0.85 mg/dL (ref 0.44–1.00)
GFR calc Af Amer: >60 mL/min (ref >60)
GFR calc non Af Amer: >60 mL/min (ref >60)
Glucose, Bld: 149 mg/dL — ABNORMAL HIGH (ref 70–99)
Potassium: 4.1 mmol/L (ref 3.5–5.1)
Sodium: 147 mmol/L — ABNORMAL HIGH (ref 135–145)

## 2020-01-01 LAB — BPAM RBC
Blood Product Expiration Date: 202102122359
Blood Product Expiration Date: 202102122359
ISSUE DATE / TIME: 202101091701
Unit Type and Rh: 5100
Unit Type and Rh: 5100

## 2020-01-01 LAB — CULTURE, BLOOD (ROUTINE X 2)
Culture: NO GROWTH
Culture: NO GROWTH
Special Requests: ADEQUATE

## 2020-01-01 LAB — HEPATIC FUNCTION PANEL
ALT: 22 U/L (ref 0–44)
AST: 16 U/L (ref 15–41)
Albumin: 1.3 g/dL — ABNORMAL LOW (ref 3.5–5.0)
Alkaline Phosphatase: 170 U/L — ABNORMAL HIGH (ref 38–126)
Bilirubin, Direct: <0.1 mg/dL (ref 0.0–0.2)
Total Bilirubin: 0.6 mg/dL (ref 0.3–1.2)
Total Protein: 7.2 g/dL (ref 6.5–8.1)

## 2020-01-01 NOTE — Progress Notes (Signed)
Nutrition Follow-up  DOCUMENTATION CODES:   Not applicable  INTERVENTION:   Continue:    Jevity 1.5 at 50 ml/h via PEG.   Pro-stat 30 ml TID.   Provides 2100 kcal, 122 gm protein, 912 ml free water daily.   Free water flushes 250 ml every 4 hours for total free water intake 2412 ml per day.   Juven BID via PEG, each packet provides 80 calories, 8 grams of carbohydrate, 2.5  grams of protein (collagen), 7 grams of L-arginine and 7 grams of L-glutamine; supplement contains CaHMB, Vitamins C, E, B12 and Zinc to promote wound healing  NUTRITION DIAGNOSIS:   Increased nutrient needs related to wound healing as evidenced by estimated needs.  Ongoing  GOAL:   Patient will meet greater than or equal to 90% of their needs  Met with TF  MONITOR:   TF tolerance, Weight trends, Labs, Skin  ASSESSMENT:   64 yo female admitted from [facility] with MRSA bacteremia. PMH includes quadriplegia, chronic trach, vent dependence, PEG, anxiety/depression, PTSD, epidural abscess, DM-2, CKD stage 3, COPD, shellfish allergy.   S/P FEES 1/7. Patient with severe oral dysphagia with severe aspiration risk. Remains NPO. Receiving Jevity 1.5 via PEG at 50 ml/h with Pro-stat 30 ml TID and Juven BID. Free water flushes 250 ml every 4 hours.   Patient remains on chronic ventilator support via trach. Temp (24hrs), Avg:97.1 F (36.2 C), Min:96 F (35.6 C), Max:97.7 F (36.5 C)   Labs reviewed. Sodium 147 (H), BUN 101 (H) CBG's: [phone]  Medications reviewed.   Weights stable, 92 kg today.  Plans for d/c to SNF vs LTACH soon.  Diet Order:   Diet Order                Diet NPO time specified Except for: Sips with Meds  Diet effective midnight              EDUCATION NEEDS:   No education needs have been identified at this time  Skin:  Skin Assessment: Skin Integrity Issues: Skin Integrity Issues:: Stage III Stage III: L heel  Last BM:  1/12 type 7 rectal tube  Height:    Ht Readings from Last 1 Encounters:  12/24/19 5' 10" (1.778 m)    Weight:   Wt Readings from Last 1 Encounters:  01/01/20 92 kg    Ideal Body Weight:  65.5 kg  BMI:  Body mass index is 29.1 kg/m.  Estimated Nutritional Needs:   Kcal:  2549-8264  Protein:  110-130 gm  Fluid:  >/= 2 L    Molli Barrows, RD, LDN, [place] Pager [phone] After Hours Pager [phone]

## 2020-01-01 NOTE — Progress Notes (Addendum)
Patient ID: Gabrielle Wade, female   DOB: [birth year], 64 y.o.   MRN: [id number] Patient is waiting to be discharged to SNF.  Patient seen and examined at bedside.  Currently medically stable for transfer.  Please refer to the discharge summary done by Dr. Gerri Lins on 12/31/2019 for full details.

## 2020-01-01 NOTE — Evaluation (Signed)
Occupational Therapy Evaluation Patient Details Name: Gabrielle Wade MRN: [id number] DOB: 1956-05-16 Today's Date: 01/01/2020    History of Present Illness Gabrielle Wade  is a 64 y.o. female, w anxiety/ depression,PTSD,  hx of epidural abscess (mrsa), w quadriplegia , Dm2, CKD stage3,  Copd, Chronic Trach/ vent dependence, Gerd, chronic dysphagia, Anemia, presents with ? Thrombocytopenia, Hgb 6.8, and wbc 0.6 per [facility].    Clinical Impression   Pt admitted for above and limited by problem list below.  She was at [facility], PT called and patient was receiving OT/PT for P/AAROM but she had not transferred to a chair.  She requires total assist at baseline for self care and bed mobility.  Believe she will benefit from continued OT services while admitted in order to optimize ROM, positioning, and determine if she can progress functionally to reduce burden of care.  Will follow acutely, dc plan listed below.     Follow Up Recommendations  LTACH;Supervision/Assistance - 24 hour    Equipment Recommendations  Other (comment)(TBD at next venue of care)    Recommendations for Other Services       Precautions / Restrictions Precautions Precautions: Cervical Restrictions Weight Bearing Restrictions: No      Mobility Bed Mobility               General bed mobility comments: NT   Transfers                 General transfer comment: NT    Balance                                           ADL either performed or assessed with clinical judgement   ADL Overall ADL's : Needs assistance/impaired                                     Functional mobility during ADLs: Total assistance General ADL Comments: total assist for all self care at this time      Vision   Vision Assessment?: No apparent visual deficits     Perception     Praxis      Pertinent Vitals/Pain Pain Assessment: Faces Faces Pain Scale: Hurts a little bit Pain  Location: generalized  Pain Descriptors / Indicators: Discomfort Pain Intervention(s): Monitored during session;Repositioned;Patient requesting pain meds-RN notified     Hand Dominance Right   Extremity/Trunk Assessment Upper Extremity Assessment Upper Extremity Assessment: RUE deficits/detail;LUE deficits/detail RUE Deficits / Details: no active movement, pt reports sensation WFL; edema noted RUE Coordination: decreased fine motor;decreased gross motor LUE Deficits / Details: no active movement, pt reports sensation WFL; edema noted LUE Coordination: decreased fine motor;decreased gross motor   Lower Extremity Assessment Lower Extremity Assessment: Defer to PT evaluation RLE Deficits / Details: no active movement  LLE Deficits / Details: Trace movement noted at knee/hip but pt could not perform to command so question if it was reflexive       Communication Communication Communication: Tracheostomy(on vent, shakes head and mouths words )   Cognition Arousal/Alertness: Awake/alert Behavior During Therapy: Flat affect Overall Cognitive Status: Difficult to assess  General Comments       Exercises Exercises: Other exercises Low Level/ICU Exercises Ankle Circles/Pumps: PROM;Both;5 reps;Supine Hip ABduction/ADduction: PROM;Both;5 reps;Supine Heel Slides: PROM;Both;5 reps;Supine Other Exercises Other Exercises: Engaged in PROM of BUE from shoulder to hands, edema noted and placed UEs on pillows for elevation   Shoulder Instructions      Home Living Family/patient expects to be discharged to:: Other (Comment)                                 Additional Comments: LTAC      Prior Functioning/Environment Level of Independence: Needs assistance  Gait / Transfers Assistance Needed: Did not get OOB per staff at [facility].  She was in [facility] and then had been on Subacute at [facility]  12/30-1/4 before she got sick.  Per [facility], the PT/OT performed P/ROM and AA/ROM and reposiitioned in bed only and was not lifted out of bed. ADL's / Homemaking Assistance Needed: Total care            OT Problem List: Decreased strength;Decreased range of motion;Decreased activity tolerance;Decreased coordination;Decreased safety awareness;Decreased knowledge of use of DME or AE;Decreased knowledge of precautions;Cardiopulmonary status limiting activity;Impaired sensation;Impaired UE functional use;Obesity;Increased edema      OT Treatment/Interventions: Therapeutic activities;Self-care/ADL training;Neuromuscular education;Manual therapy;Balance training;Cognitive remediation/compensation;Patient/family education    OT Goals(Current goals can be found in the care plan section) Acute Rehab OT Goals Patient Stated Goal: to get OOB to chair and to have therapy OT Goal Formulation: With patient Time For Goal Achievement: 01/15/20 Potential to Achieve Goals: Fair  OT Frequency: Min 1X/week   Barriers to D/C:            Co-evaluation              AM-PAC OT "6 Clicks" Daily Activity     Outcome Measure Help from another person eating meals?: Total Help from another person taking care of personal grooming?: Total Help from another person toileting, which includes using toliet, bedpan, or urinal?: Total Help from another person bathing (including washing, rinsing, drying)?: Total Help from another person to put on and taking off regular upper body clothing?: Total Help from another person to put on and taking off regular lower body clothing?: Total 6 Click Score: 6   End of Session Nurse Communication: Mobility status  Activity Tolerance: Patient tolerated treatment well Patient left: in bed;with call bell/phone within reach;with bed alarm set;with nursing/sitter in room  OT Visit Diagnosis: Other abnormalities of gait and mobility (R26.89);Muscle weakness (generalized)  (M62.81);Other symptoms and signs involving the nervous system (R29.898)                Time: 7867-6720 OT Time Calculation (min): 24 min Charges:  OT General Charges $OT Visit: 1 Visit OT Evaluation $OT Eval Moderate Complexity: 1 Mod OT Treatments $Therapeutic Exercise: 8-22 mins  Jolaine Artist, OT Acute Rehabilitation Services Pager [phone] Office [phone]   Delight Stare 01/01/2020, 1:06 PM

## 2020-01-01 NOTE — Progress Notes (Addendum)
2:50pm: CSW attempted to reach [place] at [place] once again without success.  1pm: CSW left voicemail with Coralee North at [facility] to determine if a decisio was made regarding this patient's possible admission.  12pm: CSW received return call from [place] at [place] who agreed to review this patient. All clinicals sent for review.  11am: CSW made several additional attempts to reach Clydie Braun at [facility] without success.  CSW attempted to reach [place] at [facility] in [place], a voicemail was left requesting a return call.  Insurance authorization will also need to be obtained prior to the patient transferring to any facility.   8am: CSW spoke with Misty Stanley at [facility] who states there is not a bed available for this patient until tomorrow. Misty Stanley reports that the patient's husband is aware that [facility] is no longer in network with her insurance and was planning to call and get it switched. Misty Stanley confirms she will take the patient back tomorrow if [facility] is unable to offer a bed.  CSW reached out to Clydie Braun at [facility] to determine bed availability and to request a review of this patient for possible admission.  Edwin Dada, MSW, LCSW-A Transitions of Care  Clinical Social Worker  [facility] Emergency Departments  Medical ICU [phone]

## 2020-01-01 NOTE — Progress Notes (Deleted)
NAME:  Gabrielle Wade, MRN:  [id number], DOB:  May 16, 1956, LOS: 9 ADMISSION DATE:  12/23/2019, CONSULTATION DATE:  12/22/2018 REFERRING MD:  Dr. [name], CHIEF COMPLAINT:  Chronic vent management  Brief History    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 She is ventilator depended due to respiratory failure and was transferred from [facility] for evaluation of her leukopenia and anemia. PCCM was consulted by [facility] for chronic tracheostomy and ventilator management.   History of present illness   HPI obtained from medical chart review as patient is trach/ mechanical ventilator dependent.  No bedside medical records found, therefore limited HPI.   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  In ER, hemodynamically stable and initial temp 95.5.  Patient is awake in no distress on ventilator with settings PRVC, rate 12, FiO2 30%, TV 480, PEEP 5.  Unknown if patient weans.  Workup notable for WBC 0.8, Hgb 6.2, platelets 255, glucose 177, BUN 69, sCr 1.24, alk phos 247, albumin 1.2, corrected calcium 10.8, trop hs 5, lactic acid 1, normal coags, TSH 24.57, free T4 0.66, FOB negative, SARS2 PCR neg, UA negative.  CXR showing cardiomegaly with moderate bilateral pleural effusions, bilateral airspace and bibasilar opacities concerning for developing edema vs atypical infectious process vs atelectasis vs developing infiltrate.  TRH to admit, PCCM consulted for vent and tracheostomy management.   Past Medical History  Cervical epidural abscess- MRSA in 08/2019 with resultant quadriplegia with tracheostomy and ventilator dependent respiratory failure,  PEG, DMT2, CKD stage III, COPD, GERD, anxiety/ depression, PTSD  Significant Hospital Events   1/4 Admit [facility]  Consults:  PCCM - trach / vent   Procedures:  pta trach pta PEG   Significant Diagnostic Tests:  12/24/19 Complete Echocardiogram:  -LVEF 55-60% - Grade II Diastolic Dysfunction Micro Data:  1/4 SARS 2/ Flu A/B >> neg 1/4 UC >> >100K cfus of E coli and klebsiella pneumonia 1/4 BCx 2 >> preliminary staph species methicillin-resistant 1/4 urine strep >> (-) 1/4 urine legionella >> (-)  Antimicrobials:  1/4 cefepime >> 1/6 1/4 vanc >>1/14 1/6 meropenem >> 1/11 1/10 Rifampin>>1/14  Interim history/subjective:   O/N Events:No events overnight.  No complaints this AM.  Objective   Blood pressure (!) 152/64, pulse 81, temperature 97.7 F (36.5 C), temperature source Oral, resp. rate 14, height 5' 10"  (1.778 m), weight 92 kg, SpO2 93 %.    Vent Mode: PRVC FiO2 (%):  [30 %-40 %] 30 % Set Rate:  [12 bmp] 12 bmp Vt Set:  [480 mL] 480 mL PEEP:  [5 cmH20] 5 cmH20 Plateau Pressure:  [20 cmH20-25 cmH20] 20 cmH20   Intake/Output Summary (Last 24 hours) at 01/01/2020 0724 Last data filed at 01/01/2020 0600 Gross per 24 hour  Intake 2100 ml  Output 1700 ml  Net 400 ml    Examination: General: Awake in bed, able to answer questions by shaking and nodding her head to questions.  HEENT: normocephalic, atraumatic, trach intact, no scleral icterus noted.  Cardio: RRR no murmurs, rubs, or gallops Pulmonary: Clear to auscultation bilaterally, no rales, wheezes, or rhonchi appreciated.  Abdomen: BS present, non distended, non tender to palpation. MSK: no pitting edema noted bilaterally  in the lower extremeties Neuro: able to answer questions appropriately with nodding and shaking of her head Skin: warm and dry.   Resolved Hospital Problem list     Assessment & Plan:   Chronic Respiratory Failure with Tracheostomy and PEG 2/2 to Cervical Epidural Abscess Resulting in  Quadriplegia Requiring Ventilation Support:   - Full vent support, no weaning - Current vent settings 30/5/12/480 - Titrate O2 for sat of 88-92% - Maintain current trach size and type  HCAP:  - Blood Cultures with MRSA - Urine Cultures + E. Coli and Klebsiella - On Vancomycin and Rifampin - Continue Per Primary   Candidemia: - Continuing diflucan   Caloric Malnutrition:  -Continue tube feeds  [name], MD IMTS, PGY-1 Pager: [phone] 01/01/2020,8:17 AM   Best practice:  Diet: NPO, PEG tube feeds resumed Pain/Anxiety/Delirium protocol (if indicated): not needed VAP protocol (if indicated): yes DVT prophylaxis: SCDs GI prophylaxis: PPI Glucose control: CBG q 4, SSI Mobility: PT/ OT Code Status: full  Family Communication: per primary, patient updated on plan of care Disposition: admit [facility]/ PCCM for chronic vent managment  Labs   CBC: Recent Labs  Lab 12/26/19 0251 12/27/19 0335 12/28/19 8372 12/28/19 2035 12/29/19 0405 12/30/19 0458 12/31/19 0426 12/31/19 1037  WBC 0.9* 1.2* 0.6*  --  1.8* 2.8* 2.9*  --   NEUTROABS 0.0*  --   --   --   --   --   --   --   HGB 7.0* 7.2* 6.7* 7.6* 7.4* 7.5* 7.6* 8.5*  HCT 23.5* 24.5* 22.3* 25.1* 24.2* 26.5* 26.7* 25.0*  MCV 97.5 100.4* 99.1  --  96.8 100.8* 101.9*  --   PLT 209 185 128*  --  148* 155 164  --     Basic Metabolic Panel: Recent Labs  Lab 12/29/19 0405 12/30/19 0458 12/31/19 0426 12/31/19 1037 12/31/19 1326 01/01/20 0524  NA 149* 151* 151* 152* 149* 147*  K 4.0 4.3 4.2 4.4 4.2 4.1  CL 111 114* 113*  --  112* 108  CO2 31 33* 34*  --  33* 33*  GLUCOSE 132* 128* 122*  --  184* 149*  BUN 104* 105* 104*  --  106* 101*  CREATININE 0.97 1.01* 1.02*  --  0.91 0.85  CALCIUM 8.7* 9.1 9.3  --  9.3 9.3   GFR: Estimated Creatinine Clearance: 81.1 mL/min (by C-G formula based on SCr of 0.85 mg/dL). Recent Labs  Lab 12/28/19 0638 12/29/19 0405 12/30/19 0458 12/31/19 0426  WBC 0.6* 1.8* 2.8* 2.9*     Liver Function Tests: Recent Labs  Lab 12/28/19 9021 12/30/19 0458 12/31/19 0426 01/01/20 0524  AST 15 17 16 16   ALT 25 24 25 22   ALKPHOS 185* 183* 184* 170*  BILITOT 1.0 1.0 0.6 0.6  PROT 6.4* 7.0 7.0 7.2  ALBUMIN 1.1* 1.3* 1.2* 1.3*   No results for input(s): LIPASE, AMYLASE in the last 168 hours. No results for input(s): AMMONIA in the last 168 hours.  ABG    Component Value Date/Time   PHART 7.420 12/31/2019 1037   PCO2ART 53.5 (H) 12/31/2019 1037   PO2ART 43.0 (L) 12/31/2019 1037   HCO3 34.9 (H) 12/31/2019 1037   TCO2 37 (H) 12/31/2019 1037   O2SAT 80.0 12/31/2019 1037     Coagulation Profile: No results for input(s): INR, PROTIME in the last 168 hours.  Cardiac Enzymes: No results for input(s): CKTOTAL, CKMB, CKMBINDEX, TROPONINI in the last 168 hours.  HbA1C: No results found for: HGBA1C  CBG: Recent Labs  Lab 12/31/19 1552 12/31/19 1939 01/01/20 0017 01/01/20 0021 01/01/20 0350  GLUCAP 152* 119* 69* 74 116*

## 2020-01-01 NOTE — Evaluation (Signed)
Physical Therapy Evaluation Patient Details Name: Gabrielle Wade MRN: [id number] DOB: 02-Feb-1956 Today's Date: 01/01/2020   History of Present Illness  Marybel Alcott  is a 64 y.o. female, w anxiety/ depression,PTSD,  hx of epidural abscess (mrsa), w quadriplegia , Dm2, CKD stage3,  Copd, Chronic Trach/ vent dependence, Gerd, chronic dysphagia, Anemia, presents with ? Thrombocytopenia, Hgb 6.8, and wbc 0.6 per [facility].   Clinical Impression  Pt admitted with above diagnosis. Called [facility] and pt was not getting lifted OOB and had only been there since 12/30.  PT/OT were performing P/ROM and AA/ROM with pt prior to pt moving to [facility].  Will trial PT with pt 1x week and determine if pt can progress.  Pt currently with functional limitations due to the deficits listed below (see PT Problem List). Pt will benefit from skilled PT to increase their independence and safety with mobility to allow discharge to the venue listed below.      Follow Up Recommendations LTACH;Supervision/Assistance - 24 hour    Equipment Recommendations  Other (comment)(TBA)    Recommendations for Other Services       Precautions / Restrictions Precautions Precautions: Cervical Restrictions Weight Bearing Restrictions: No      Mobility  Bed Mobility               General bed mobility comments: NT   Transfers                 General transfer comment: NT  Ambulation/Gait             General Gait Details: Unable  Stairs            Wheelchair Mobility    Modified Rankin (Stroke Patients Only)       Balance                                             Pertinent Vitals/Pain Pain Assessment: No/denies pain    Home Living Family/patient expects to be discharged to:: Other (Comment)                 Additional Comments: LTAC    Prior Function Level of Independence: Needs assistance   Gait / Transfers Assistance Needed: Did not get OOB per staff at  [place].  She was in [facility] 10/[facility]-12/30 and then had been on Subacute at [facility] 12/30-1/4 before she got sick.  Per [facility], the PT/OT performed P/ROM and AA/ROM and reposiitioned in bed only and was not lifted out of bed.  ADL's / Homemaking Assistance Needed: Total care        Hand Dominance        Extremity/Trunk Assessment   Upper Extremity Assessment Upper Extremity Assessment: Defer to OT evaluation    Lower Extremity Assessment Lower Extremity Assessment: RLE deficits/detail;LLE deficits/detail RLE Deficits / Details: no active movement  LLE Deficits / Details: Trace movement noted at knee/hip but pt could not perform to command so question if it was reflexive       Communication   Communication: Tracheostomy(on vent, shakes head to yes no questions)  Cognition Arousal/Alertness: Awake/alert Behavior During Therapy: Flat affect Overall Cognitive Status: Difficult to assess  General Comments      Exercises Low Level/ICU Exercises Ankle Circles/Pumps: PROM;Both;5 reps;Supine Hip ABduction/ADduction: PROM;Both;5 reps;Supine Heel Slides: PROM;Both;5 reps;Supine   Assessment/Plan    PT Assessment Patient needs continued PT services  PT Problem List Decreased balance;Decreased mobility;Decreased activity tolerance;Decreased strength;Decreased range of motion;Decreased knowledge of precautions       PT Treatment Interventions Therapeutic activities;Therapeutic exercise;Functional mobility training;Patient/family education    PT Goals (Current goals can be found in the Care Plan section)  Acute Rehab PT Goals Patient Stated Goal: pt states she wants therapy PT Goal Formulation: Patient unable to participate in goal setting Time For Goal Achievement: 01/15/20 Potential to Achieve Goals: Fair    Frequency Min 1X/week   Barriers to discharge Decreased caregiver support       Co-evaluation               AM-PAC PT "6 Clicks" Mobility  Outcome Measure Help needed turning from your back to your side while in a flat bed without using bedrails?: Total Help needed moving from lying on your back to sitting on the side of a flat bed without using bedrails?: Total Help needed moving to and from a bed to a chair (including a wheelchair)?: Total Help needed standing up from a chair using your arms (e.g., wheelchair or bedside chair)?: Total Help needed to walk in hospital room?: Total Help needed climbing 3-5 steps with a railing? : Total 6 Click Score: 6    End of Session   Activity Tolerance: Patient limited by fatigue Patient left: in bed;with call bell/phone within reach;with bed alarm set;with SCD's reapplied Nurse Communication: Mobility status;Need for lift equipment PT Visit Diagnosis: Muscle weakness (generalized) (M62.81)    Time: 2330-0762 PT Time Calculation (min) (ACUTE ONLY): 25 min   Charges:   PT Evaluation $PT Eval Moderate Complexity: 1 Mod PT Treatments $Self Care/Home Management: 8-22        Terrill Wauters W,PT Acute Rehabilitation Services Pager:  [phone]  Office:  [phone]    Berline Lopes 01/01/2020, 1:00 PM

## 2020-01-02 LAB — CBC WITH DIFFERENTIAL/PLATELET
Abs Immature Granulocytes: 0.32 10*3/uL — ABNORMAL HIGH (ref 0.00–0.07)
Basophils Absolute: 0.1 10*3/uL (ref 0.0–0.1)
Basophils Relative: 2 %
Eosinophils Absolute: 0.7 10*3/uL — ABNORMAL HIGH (ref 0.0–0.5)
Eosinophils Relative: 17 %
HCT: 26.9 % — ABNORMAL LOW (ref 36.0–46.0)
Hemoglobin: 7.7 g/dL — ABNORMAL LOW (ref 12.0–15.0)
Immature Granulocytes: 8 %
Lymphocytes Relative: 23 %
Lymphs Abs: 0.9 10*3/uL (ref 0.7–4.0)
MCH: 28.8 pg (ref 26.0–34.0)
MCHC: 28.6 g/dL — ABNORMAL LOW (ref 30.0–36.0)
MCV: 100.7 fL — ABNORMAL HIGH (ref 80.0–100.0)
Monocytes Absolute: 0.4 10*3/uL (ref 0.1–1.0)
Monocytes Relative: 9 %
Neutro Abs: 1.8 10*3/uL (ref 1.7–7.7)
Neutrophils Relative %: 41 %
Platelets: 196 10*3/uL (ref 150–400)
RBC: 2.67 MIL/uL — ABNORMAL LOW (ref 3.87–5.11)
RDW: 18.2 % — ABNORMAL HIGH (ref 11.5–15.5)
WBC: 4.2 10*3/uL (ref 4.0–10.5)
nRBC: 0 % (ref 0.0–0.2)

## 2020-01-02 LAB — BASIC METABOLIC PANEL
Anion gap: 6 (ref 5–15)
BUN: 98 mg/dL — ABNORMAL HIGH (ref 8–23)
CO2: 33 mmol/L — ABNORMAL HIGH (ref 22–32)
Calcium: 9.1 mg/dL (ref 8.9–10.3)
Chloride: 110 mmol/L (ref 98–111)
Creatinine, Ser: 0.81 mg/dL (ref 0.44–1.00)
GFR calc Af Amer: >60 mL/min (ref >60)
GFR calc non Af Amer: >60 mL/min (ref >60)
Glucose, Bld: 146 mg/dL — ABNORMAL HIGH (ref 70–99)
Potassium: 4.4 mmol/L (ref 3.5–5.1)
Sodium: 149 mmol/L — ABNORMAL HIGH (ref 135–145)

## 2020-01-02 LAB — MAGNESIUM: Magnesium: 2.1 mg/dL (ref 1.7–2.4)

## 2020-01-02 LAB — GLUCOSE, CAPILLARY
Glucose-Capillary: 98 mg/dL (ref 70–99)
Glucose-Capillary: 139 mg/dL — ABNORMAL HIGH (ref 70–99)
Glucose-Capillary: 107 mg/dL — ABNORMAL HIGH (ref 70–99)
Glucose-Capillary: 161 mg/dL — ABNORMAL HIGH (ref 70–99)
Glucose-Capillary: 126 mg/dL — ABNORMAL HIGH (ref 70–99)
Glucose-Capillary: 122 mg/dL — ABNORMAL HIGH (ref 70–99)

## 2020-01-02 MED ORDER — FREE WATER
300.0000 mL | Status: DC
  Administered 2020-01-02 – 2020-01-07 (×31): 300 mL

## 2020-01-02 NOTE — Progress Notes (Addendum)
11am: CSW received return call from [place] at [place] who states this patient is under review by respiratory staff at this time. Coralee North will return call to CSW with a decision.  CSW updated patient's RN Brett Albino of information.  9:30am: CSW spoke with Damian Leavell from [facility] marketing department to request a review of this patient's clinical information. Damian Leavell agreeable, CSW faxed clinicals to [phone].  8am: CSW attempted to reach admissions at [facility] without success, additional voicemail was left requesting return call.  CSW spoke with patient's husband Darcel Bayley to inform him of the efforts being made to discharge the patient back to a vent SNF. Darcel Bayley desires the patient to go to a facility covered by her insurance benefits. Darcel Bayley denies having private funds to cover the co-pay days the patient would be responsible for if she were to return to [facility].  Edwin Dada, MSW, LCSW-A Transitions of Care  Clinical Social Worker  [facility] Emergency Departments  Medical ICU [phone]

## 2020-01-02 NOTE — Progress Notes (Signed)
Patient ID: Gabrielle Wade, female   DOB: [birth year], 64 y.o.   MRN: [id number]  PROGRESS NOTE    Gabrielle Wade  HQP:[id number] DOB: 10/21/1956 DOA: 12/23/2019 PCP: Townsend Roger, MD   Brief Narrative:  64 year old female with history of anxiety/depression, PTSD, MRSA epidural abscess, quadriplegia, diabetes mellitus type 2, chronic kidney disease stage III, COPD, chronic hypoxic respiratory failure on chronic trach/vent dependence, chronic dysphagia status post PEG tube, GERD, anemia presented with hemoglobin of 6.8 and WBC of 0.6 per [facility].  On presentation, chest x-ray showed bilateral airspace opacities.  She was found to have MRSA, ESBL E. coli bacteremia along with candidemia.  ID and oncology were consulted.  She was treated with meropenem, and vancomycin and Diflucan.  PCCM was consulted as well for trach/vent management.  Echo showed EF of 50 to 50% with grade 2 diastolic dysfunction.  He was transfused 1 unit of packed red cells on 12/28/2019.  Subsequently, she has remained stable and is waiting to be discharged back to SNF with vent facility.  Currently medically stable for discharge.  Assessment & Plan:   Sepsis: Present on admission -Resolved.  Currently hemodynamically stable.  Polymicrobial bloodstream infection including MRSA, ESBL E. coli bacteremia and Candida parapsilosis -Source was probably right arm PICC line which was removed after admission.  Left heel x-ray did not show evidence of osteomyelitis.  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  Right upper extremity duplex was negative for DVT. -Has completed treatment for E. coli bacteremia with meropenem. -Currently on Diflucan and vancomycin.  ID has recommended vancomycin till 02/02/2020 along with oral rifampin to complete 6 weeks of therapy.  Continue Diflucan till 01/10/2020.  Repeat blood cultures from 12/27/2019 have been  negative so far.  Neutropenia/anemia -Likely due to sepsis from bacteremia. -Hematology evaluated the patient during the hospitalization. -Status post 1 unit of packed red cell transfusion on 12/28/2019. -Subsequently white blood cell count has normalized. -Transfuse packed red cell if hemoglobin is less than 7. -Outpatient follow-up with hematology  Hypernatremia -Sodium is 149 today.  Increase free water to 300 cc every 4 hours.  Monitor  Acute on chronic hypoxic respiratory failure/chronic vent dependence on tracheostomy -PCCM managing tracheostomy on ventilator.  Quadriplegia Dysphagia requiring PEG tube -Continue PEG tube feeding.  Acute kidney injury -Resolved  Diabetes mellitus type 2 -Continue long-acting and short-acting insulin with CBGs with SSI  Hypoalbuminemia -Continue tube feeding as per dietary recommendations.  Generalized deconditioning -Overall prognosis is very poor.  Will request palliative care consultation for goals of care discussion -Patient is currently medically stable for discharge to SNF with plan facility.  Care management/social worker working on the same.   DVT prophylaxis: Not on heparin/Lovenox because of anemia Code Status: Full Family Communication: None at bedside  disposition Plan: SNF with vent capacity once bed is available  Consultants: PCCM/ID/hematology/IR  Procedures: None  Antimicrobials:  Anti-infectives (From admission, onward)   Start     Dose/Rate Route Frequency Ordered Stop   01/01/20 0700  vancomycin (VANCOCIN) IVPB 1000 mg/200 mL premix     1,000 mg 200 mL/hr over 60 Minutes Intravenous Every 48 hours 12/30/19 0819     12/31/19 0000  vancomycin IVPB     1,000 mg Intravenous Every 48 hours 12/31/19 1244 02/03/20 2359   12/31/19 0000  fluconazole (DIFLUCAN) IVPB     400 mg Intravenous Every 24 hours 12/31/19 1244  01/11/20 2359   12/31/19 0000  rifampin (RIFADIN) 60 mg/mL SUSP oral suspension     600 mg Per Tube  Daily-1800 12/31/19 1244 02/03/20 2359   12/28/19 0800  fluconazole (DIFLUCAN) IVPB 200 mg  Status:  Discontinued     200 mg 100 mL/hr over 60 Minutes Intravenous Every 24 hours 12/27/19 0012 12/27/19 0724   12/28/19 0800  fluconazole (DIFLUCAN) IVPB 400 mg     400 mg 100 mL/hr over 120 Minutes Intravenous Every 24 hours 12/27/19 0724     12/27/19 0609  vancomycin (VANCOCIN) IVPB 1000 mg/200 mL premix  Status:  Discontinued     1,000 mg 200 mL/hr over 60 Minutes Intravenous Every 24 hours 12/26/19 0742 12/30/19 0740   12/27/19 0230  fluconazole (DIFLUCAN) IVPB 400 mg     400 mg 100 mL/hr over 120 Minutes Intravenous Every 1 hr x 2 12/27/19 0222 12/27/19 0714   12/27/19 0100  fluconazole (DIFLUCAN) IVPB 800 mg  Status:  Discontinued     800 mg 200 mL/hr over 120 Minutes Intravenous  Once 12/27/19 0012 12/27/19 0222   12/26/19 2200  meropenem (MERREM) 1 g in sodium chloride 0.9 % 100 mL IVPB     1 g 200 mL/hr over 30 Minutes Intravenous Every 12 hours 12/26/19 1337 12/30/19 2333   12/26/19 1800  rifampin (RIFADIN) 60 mg/mL oral suspension 600 mg     600 mg Per Tube Daily-1800 12/25/19 2207     12/26/19 1000  fluconazole (DIFLUCAN) tablet 200 mg  Status:  Discontinued     200 mg Per Tube Daily 12/26/19 0514 12/27/19 0012   12/26/19 0600  vancomycin (VANCOCIN) IVPB 1000 mg/200 mL premix  Status:  Discontinued     1,000 mg 200 mL/hr over 60 Minutes Intravenous Every 48 hours 12/24/19 0512 12/26/19 0742   12/25/19 1900  meropenem (MERREM) 1 g in sodium chloride 0.9 % 100 mL IVPB  Status:  Discontinued     1 g 200 mL/hr over 30 Minutes Intravenous Every 8 hours 12/25/19 1838 12/26/19 1337   12/25/19 1630  rifampin (RIFADIN) 60 mg/mL oral suspension 600 mg  Status:  Discontinued     600 mg Per Tube Daily 12/25/19 1553 12/25/19 2207   12/24/19 2250  meropenem (MERREM) 1 g in sodium chloride 0.9 % 100 mL IVPB  Status:  Discontinued     1 g 200 mL/hr over 30 Minutes Intravenous Every 8 hours  12/24/19 1942 12/25/19 1838   12/24/19 1815  fluconazole (DIFLUCAN) 40 MG/ML suspension 200 mg  Status:  Discontinued     200 mg Per Tube Daily 12/24/19 1804 12/26/19 0514   12/24/19 1445  fluconazole (DIFLUCAN) 40 MG/ML suspension 200 mg  Status:  Discontinued     200 mg Per Tube Daily 12/24/19 1436 12/24/19 1804   12/24/19 1000  ceFEPIme (MAXIPIME) 2 g in sodium chloride 0.9 % 100 mL IVPB  Status:  Discontinued     2 g 200 mL/hr over 30 Minutes Intravenous Every 12 hours 12/24/19 0512 12/24/19 0859   12/24/19 1000  meropenem (MERREM) 1 g in sodium chloride 0.9 % 100 mL IVPB  Status:  Discontinued     1 g 200 mL/hr over 30 Minutes Intravenous Every 12 hours 12/24/19 0859 12/24/19 1942   12/24/19 0315  vancomycin (VANCOREADY) IVPB 1250 mg/250 mL     1,250 mg 166.7 mL/hr over 90 Minutes Intravenous  Once 12/24/19 0250 12/24/19 0819   12/23/19 2045  ceFEPIme (MAXIPIME) 2 g in  sodium chloride 0.9 % 100 mL IVPB     2 g 200 mL/hr over 30 Minutes Intravenous  Once 12/23/19 2030 12/23/19 2201       Subjective: Patient seen and examined at bedside.  Awake, poor historian.  Nods her head to some questions.  No overnight fever or vomiting reported. Objective: Vitals:   01/02/20 0700 01/02/20 0738 01/02/20 0800 01/02/20 0900  BP: 139/60  (!) 137/51 (!) 127/51  Pulse: 73  85 69  Resp: 15  19 15   Temp:  (!) 97.5 F (36.4 C)    TempSrc:  Axillary    SpO2: 100%  98% 97%  Weight:      Height:        Intake/Output Summary (Last 24 hours) at 01/02/2020 1037 Last data filed at 01/02/2020 0900 Gross per 24 hour  Intake 2219.09 ml  Output 1310 ml  Net 909.09 ml   Filed Weights   12/31/19 0414 01/01/20 0410 01/02/20 0424  Weight: 91.8 kg 92 kg 83.5 kg    Examination:  General exam: Looks chronically ill.  No distress. Neck: Tracheostomy present Respiratory system: Bilateral decreased breath sounds at bases with scattered crackles Cardiovascular system: S1 & S2 heard, Rate  controlled Gastrointestinal system: Abdomen is nondistended, soft and nontender. Normal bowel sounds heard. Extremities: No cyanosis, clubbing; trace lower extremity edema Central nervous system: Awake, nods her head to some questions.  Quadriplegic.   Lymph: No cervical lymphadenopathy Psychiatry: Flat affect    Data Reviewed: I have personally reviewed following labs and imaging studies  CBC: Recent Labs  Lab 12/28/19 0638 12/28/19 2035 12/29/19 0405 12/30/19 0458 12/31/19 0426 12/31/19 1037 01/02/20 0708  WBC 0.6*  --  1.8* 2.8* 2.9*  --  4.2  NEUTROABS  --   --   --   --   --   --  1.8  HGB 6.7*   < > 7.4* 7.5* 7.6* 8.5* 7.7*  HCT 22.3*   < > 24.2* 26.5* 26.7* 25.0* 26.9*  MCV 99.1  --  96.8 100.8* 101.9*  --  100.7*  PLT 128*  --  148* 155 164  --  196   < > = values in this interval not displayed.   Basic Metabolic Panel: Recent Labs  Lab 12/30/19 0458 12/30/19 0458 12/31/19 0426 12/31/19 1037 12/31/19 1326 01/01/20 0524 01/02/20 0708  NA 151*   < > 151* 152* 149* 147* 149*  K 4.3   < > 4.2 4.4 4.2 4.1 4.4  CL 114*  --  113*  --  112* 108 110  CO2 33*  --  34*  --  33* 33* 33*  GLUCOSE 128*  --  122*  --  184* 149* 146*  BUN 105*  --  104*  --  106* 101* 98*  CREATININE 1.01*  --  1.02*  --  0.91 0.85 0.81  CALCIUM 9.1  --  9.3  --  9.3 9.3 9.1  MG  --   --   --   --   --   --  2.1   < > = values in this interval not displayed.   GFR: Estimated Creatinine Clearance: 81.4 mL/min (by C-G formula based on SCr of 0.81 mg/dL). Liver Function Tests: Recent Labs  Lab 12/28/19 0638 12/30/19 0458 12/31/19 0426 01/01/20 0524  AST 15 17 16 16   ALT 25 24 25 22   ALKPHOS 185* 183* 184* 170*  BILITOT 1.0 1.0 0.6 0.6  PROT 6.4* 7.0 7.0 7.2  ALBUMIN 1.1* 1.3* 1.2* 1.3*   No results for input(s): LIPASE, AMYLASE in the last 168 hours. No results for input(s): AMMONIA in the last 168 hours. Coagulation Profile: No results for input(s): INR, PROTIME in the last  168 hours. Cardiac Enzymes: No results for input(s): CKTOTAL, CKMB, CKMBINDEX, TROPONINI in the last 168 hours. BNP (last 3 results) No results for input(s): PROBNP in the last 8760 hours. HbA1C: No results for input(s): HGBA1C in the last 72 hours. CBG: Recent Labs  Lab 01/01/20 1603 01/01/20 1942 01/01/20 2330 01/02/20 0329 01/02/20 0735  GLUCAP 108* 105* 105* 98 139*   Lipid Profile: No results for input(s): CHOL, HDL, LDLCALC, TRIG, CHOLHDL, LDLDIRECT in the last 72 hours. Thyroid Function Tests: No results for input(s): TSH, T4TOTAL, FREET4, T3FREE, THYROIDAB in the last 72 hours. Anemia Panel: No results for input(s): VITAMINB12, FOLATE, FERRITIN, TIBC, IRON, RETICCTPCT in the last 72 hours. Sepsis Labs: No results for input(s): PROCALCITON, LATICACIDVEN in the last 168 hours.  Recent Results (from the past 240 hour(s))  Blood culture (routine x 2)     Status: Abnormal   Collection Time: 12/23/19  4:25 PM   Specimen: BLOOD RIGHT ARM  Result Value Ref Range Status   Specimen Description BLOOD RIGHT ARM  Final   Special Requests   Final    BOTTLES DRAWN AEROBIC AND ANAEROBIC Blood Culture adequate volume   Culture  Setup Time   Final    GRAM POSITIVE COCCI IN BOTH AEROBIC AND ANAEROBIC BOTTLES CRITICAL VALUE NOTED.  VALUE IS CONSISTENT WITH PREVIOUSLY REPORTED AND CALLED VALUE.    Culture (A)  Final    STAPHYLOCOCCUS AUREUS SUSCEPTIBILITIES PERFORMED ON PREVIOUS CULTURE WITHIN THE LAST 5 DAYS. Performed at [facility], [place] [address], [place], [place] [address]    Report Status 12/26/2019 FINAL  Final  Respiratory Panel by RT PCR (Flu A&B, Covid) - Nasopharyngeal Swab     Status: None   Collection Time: 12/23/19  4:27 PM   Specimen: Nasopharyngeal Swab  Result Value Ref Range Status   SARS Coronavirus 2 by RT PCR NEGATIVE NEGATIVE Final    Comment: (NOTE) SARS-CoV-2 target nucleic acids are NOT DETECTED. The SARS-CoV-2 RNA is generally detectable in  upper respiratoy specimens during the acute phase of infection. The lowest concentration of SARS-CoV-2 viral copies this assay can detect is 131 copies/mL. A negative result does not preclude SARS-Cov-2 infection and should not be used as the sole basis for treatment or other patient management decisions. A negative result may occur with  improper specimen collection/handling, submission of specimen other than nasopharyngeal swab, presence of viral mutation(s) within the areas targeted by this assay, and inadequate number of viral copies (<131 copies/mL). A negative result must be combined with clinical observations, patient history, and epidemiological information. The expected result is Negative. Fact Sheet for Patients:  PinkCheek.be Fact Sheet for Healthcare Providers:  GravelBags.it This test is not yet ap proved or cleared by the [place] FDA and  has been authorized for detection and/or diagnosis of SARS-CoV-2 by FDA under an Emergency Use Authorization (EUA). This EUA will remain  in effect (meaning this test can be used) for the duration of the COVID-19 declaration under Section 564(b)(1) of the Act, 21 U.S.C. section 360bbb-3(b)(1), unless the authorization is terminated or revoked sooner.    Influenza A by PCR NEGATIVE NEGATIVE Final   Influenza B by PCR NEGATIVE NEGATIVE Final    Comment: (NOTE) The Xpert Xpress SARS-CoV-2/FLU/RSV assay is  intended as an aid in  the diagnosis of influenza from Nasopharyngeal swab specimens and  should not be used as a sole basis for treatment. Nasal washings and  aspirates are unacceptable for Xpert Xpress SARS-CoV-2/FLU/RSV  testing. Fact Sheet for Patients: PinkCheek.be Fact Sheet for Healthcare Providers: GravelBags.it This test is not yet approved or cleared by the [place] FDA and  has been authorized for  detection and/or diagnosis of SARS-CoV-2 by  FDA under an Emergency Use Authorization (EUA). This EUA will remain  in effect (meaning this test can be used) for the duration of the  Covid-19 declaration under Section 564(b)(1) of the Act, 21  U.S.C. section 360bbb-3(b)(1), unless the authorization is  terminated or revoked. Performed at [facility], [place] [address] [place], [place] [address]   Urine culture     Status: Abnormal   Collection Time: 12/23/19  4:51 PM   Specimen: In/Out Cath Urine  Result Value Ref Range Status   Specimen Description IN/OUT CATH URINE  Final   Special Requests   Final    NONE Performed at [facility], [place] [address] [place], [place] [address]    Culture (A)  Final    >=100,000 COLONIES/mL KLEBSIELLA PNEUMONIAE >=100,000 COLONIES/mL ESCHERICHIA COLI Confirmed Extended Spectrum Beta-Lactamase Producer (ESBL).  In bloodstream infections from ESBL organisms, carbapenems are preferred over piperacillin/tazobactam. They are shown to have a lower risk of mortality.    Report Status 12/25/2019 FINAL  Final   Organism ID, Bacteria KLEBSIELLA PNEUMONIAE (A)  Final   Organism ID, Bacteria ESCHERICHIA COLI (A)  Final      Susceptibility   Escherichia coli - MIC*    AMPICILLIN >=32 RESISTANT Resistant     CEFAZOLIN >=64 RESISTANT Resistant     CEFTRIAXONE >=64 RESISTANT Resistant     CIPROFLOXACIN >=4 RESISTANT Resistant     GENTAMICIN <=1 SENSITIVE Sensitive     IMIPENEM <=0.25 SENSITIVE Sensitive     NITROFURANTOIN <=16 SENSITIVE Sensitive     TRIMETH/SULFA <=20 SENSITIVE Sensitive     AMPICILLIN/SULBACTAM >=32 RESISTANT Resistant     PIP/TAZO 32 INTERMEDIATE Intermediate     * >=100,000 COLONIES/mL ESCHERICHIA COLI   Klebsiella pneumoniae - MIC*    AMPICILLIN >=32 RESISTANT Resistant     CEFAZOLIN <=4 SENSITIVE Sensitive     CEFTRIAXONE <=0.25 SENSITIVE Sensitive     CIPROFLOXACIN <=0.25 SENSITIVE Sensitive     GENTAMICIN <=1 SENSITIVE  Sensitive     IMIPENEM <=0.25 SENSITIVE Sensitive     NITROFURANTOIN 128 RESISTANT Resistant     TRIMETH/SULFA <=20 SENSITIVE Sensitive     AMPICILLIN/SULBACTAM 16 INTERMEDIATE Intermediate     PIP/TAZO <=4 SENSITIVE Sensitive     * >=100,000 COLONIES/mL KLEBSIELLA PNEUMONIAE  Blood culture (routine x 2)     Status: Abnormal   Collection Time: 12/23/19  4:58 PM   Specimen: BLOOD  Result Value Ref Range Status   Specimen Description BLOOD RIGHT THUMB  Final   Special Requests   Final    BOTTLES DRAWN AEROBIC AND ANAEROBIC Blood Culture adequate volume   Culture  Setup Time   Final    GRAM NEGATIVE RODS IN BOTH AEROBIC AND ANAEROBIC BOTTLES GRAM POSITIVE COCCI CRITICAL RESULT CALLED TO, READ BACK BY AND VERIFIED WITH: J. FRENS PHARMD, AT 0820 12/24/19 BY D.V [facility] Performed at [facility], [place] [address], [place], [place] [address]    Culture (A)  Final    ESCHERICHIA COLI STAPHYLOCOCCUS AUREUS Confirmed Extended  Spectrum Beta-Lactamase Producer (ESBL).  In bloodstream infections from ESBL organisms, carbapenems are preferred over piperacillin/tazobactam. They are shown to have a lower risk of mortality.    Report Status 12/26/2019 FINAL  Final   Organism ID, Bacteria ESCHERICHIA COLI  Final   Organism ID, Bacteria STAPHYLOCOCCUS AUREUS  Final      Susceptibility   Escherichia coli - MIC*    AMPICILLIN >=32 RESISTANT Resistant     CEFAZOLIN >=64 RESISTANT Resistant     CEFEPIME 16 RESISTANT Resistant     CEFTAZIDIME RESISTANT Resistant     CEFTRIAXONE >=64 RESISTANT Resistant     CIPROFLOXACIN >=4 RESISTANT Resistant     GENTAMICIN <=1 SENSITIVE Sensitive     IMIPENEM <=0.25 SENSITIVE Sensitive     TRIMETH/SULFA <=20 SENSITIVE Sensitive     AMPICILLIN/SULBACTAM 16 INTERMEDIATE Intermediate     PIP/TAZO <=4 SENSITIVE Sensitive     * ESCHERICHIA COLI   Staphylococcus aureus - MIC*    CIPROFLOXACIN >=8 RESISTANT Resistant     ERYTHROMYCIN >=8 RESISTANT Resistant      GENTAMICIN <=0.5 SENSITIVE Sensitive     OXACILLIN >=4 RESISTANT Resistant     TETRACYCLINE >=16 RESISTANT Resistant     VANCOMYCIN <=0.5 SENSITIVE Sensitive     TRIMETH/SULFA <=10 SENSITIVE Sensitive     CLINDAMYCIN <=0.25 SENSITIVE Sensitive     RIFAMPIN <=0.5 SENSITIVE Sensitive     Inducible Clindamycin NEGATIVE Sensitive     * STAPHYLOCOCCUS AUREUS  Blood Culture ID Panel (Reflexed)     Status: Abnormal   Collection Time: 12/23/19  4:58 PM  Result Value Ref Range Status   Enterococcus species NOT DETECTED NOT DETECTED Final   Listeria monocytogenes NOT DETECTED NOT DETECTED Final   Staphylococcus species DETECTED (A) NOT DETECTED Final    Comment: CRITICAL RESULT CALLED TO, READ BACK BY AND VERIFIED WITH: J. FRENS PHARMD, AT 5809 12/24/19 BY D. VANHOOK    Staphylococcus aureus (BCID) DETECTED (A) NOT DETECTED Final    Comment: Methicillin (oxacillin)-resistant Staphylococcus aureus (MRSA). MRSA is predictably resistant to beta-lactam antibiotics (except ceftaroline). Preferred therapy is vancomycin unless clinically contraindicated. Patient requires contact precautions if  hospitalized. CRITICAL RESULT CALLED TO, READ BACK BY AND VERIFIED WITH: J. FRENS PHARMD, AT 9833 12/24/19 BY D. VANHOOK    Methicillin resistance DETECTED (A) NOT DETECTED Final    Comment: CRITICAL RESULT CALLED TO, READ BACK BY AND VERIFIED WITH: J. FRENS PHARMD, AT 8250 12/24/19 BY D. VANHOOK    Streptococcus species NOT DETECTED NOT DETECTED Final   Streptococcus agalactiae NOT DETECTED NOT DETECTED Final   Streptococcus pneumoniae NOT DETECTED NOT DETECTED Final   Streptococcus pyogenes NOT DETECTED NOT DETECTED Final   Acinetobacter baumannii NOT DETECTED NOT DETECTED Final   Enterobacteriaceae species DETECTED (A) NOT DETECTED Final    Comment: Enterobacteriaceae represent a large family of gram-negative bacteria, not a single organism. CRITICAL RESULT CALLED TO, READ BACK BY AND VERIFIED WITH: J. FRENS  PHARMD, AT 5397 12/24/19 BY D. VANHOOK    Enterobacter cloacae complex NOT DETECTED NOT DETECTED Final   Escherichia coli DETECTED (A) NOT DETECTED Final    Comment: CRITICAL RESULT CALLED TO, READ BACK BY AND VERIFIED WITH: J. FRENS PHARMD, AT 6734 12/24/19 BY D. VANHOOK    Klebsiella oxytoca NOT DETECTED NOT DETECTED Final   Klebsiella pneumoniae NOT DETECTED NOT DETECTED Final   Proteus species NOT DETECTED NOT DETECTED Final   Serratia marcescens NOT DETECTED NOT DETECTED Final   Carbapenem resistance NOT DETECTED NOT  DETECTED Final   Haemophilus influenzae NOT DETECTED NOT DETECTED Final   Neisseria meningitidis NOT DETECTED NOT DETECTED Final   Pseudomonas aeruginosa NOT DETECTED NOT DETECTED Final   Candida albicans NOT DETECTED NOT DETECTED Final   Candida glabrata NOT DETECTED NOT DETECTED Final   Candida krusei NOT DETECTED NOT DETECTED Final   Candida parapsilosis NOT DETECTED NOT DETECTED Final   Candida tropicalis NOT DETECTED NOT DETECTED Final    Comment: Performed at [facility], [place] [address], [place], [place] [address]  MRSA PCR Screening     Status: Abnormal   Collection Time: 12/24/19  5:50 PM   Specimen: Nasal Mucosa; Nasopharyngeal  Result Value Ref Range Status   MRSA by PCR POSITIVE (A) NEGATIVE Final    Comment:        The GeneXpert MRSA Assay (FDA approved for NASAL specimens only), is one component of a comprehensive MRSA colonization surveillance program. It is not intended to diagnose MRSA infection nor to guide or monitor treatment for MRSA infections. RESULT CALLED TO, READ BACK BY AND VERIFIED WITH: Everlean Alstrom RN 2017 12/24/19 A BROWNING Performed at [facility], [place] [address] [place], [place] [address]   Culture, blood (routine x 2)     Status: Abnormal   Collection Time: 12/25/19 10:26 AM   Specimen: BLOOD RIGHT HAND  Result Value Ref Range Status   Specimen Description BLOOD RIGHT HAND  Final   Special Requests   Final     BOTTLES DRAWN AEROBIC ONLY Blood Culture adequate volume   Culture  Setup Time   Final    AEROBIC BOTTLE ONLY YEAST CRITICAL RESULT CALLED TO, READ BACK BY AND VERIFIED WITH: Carmelia Bake [facility] 12/26/19 2340 JDW Performed at [facility], [place] [address] [place], [place] [address]    Culture CANDIDA PARAPSILOSIS (A)  Final   Report Status 12/28/2019 FINAL  Final  Blood Culture ID Panel (Reflexed)     Status: Abnormal   Collection Time: 12/25/19 10:26 AM  Result Value Ref Range Status   Enterococcus species NOT DETECTED NOT DETECTED Final   Listeria monocytogenes NOT DETECTED NOT DETECTED Final   Staphylococcus species NOT DETECTED NOT DETECTED Final   Staphylococcus aureus (BCID) NOT DETECTED NOT DETECTED Final   Streptococcus species NOT DETECTED NOT DETECTED Final   Streptococcus agalactiae NOT DETECTED NOT DETECTED Final   Streptococcus pneumoniae NOT DETECTED NOT DETECTED Final   Streptococcus pyogenes NOT DETECTED NOT DETECTED Final   Acinetobacter baumannii NOT DETECTED NOT DETECTED Final   Enterobacteriaceae species NOT DETECTED NOT DETECTED Final   Enterobacter cloacae complex NOT DETECTED NOT DETECTED Final   Escherichia coli NOT DETECTED NOT DETECTED Final   Klebsiella oxytoca NOT DETECTED NOT DETECTED Final   Klebsiella pneumoniae NOT DETECTED NOT DETECTED Final   Proteus species NOT DETECTED NOT DETECTED Final   Serratia marcescens NOT DETECTED NOT DETECTED Final   Haemophilus influenzae NOT DETECTED NOT DETECTED Final   Neisseria meningitidis NOT DETECTED NOT DETECTED Final   Pseudomonas aeruginosa NOT DETECTED NOT DETECTED Final   Candida albicans NOT DETECTED NOT DETECTED Final   Candida glabrata NOT DETECTED NOT DETECTED Final   Candida krusei NOT DETECTED NOT DETECTED Final   Candida parapsilosis DETECTED (A) NOT DETECTED Final    Comment: CRITICAL RESULT CALLED TO, READ BACK BY AND VERIFIED WITH: K HAMMONS PHARMD 12/26/19 2340 JDW    Candida tropicalis NOT  DETECTED NOT DETECTED Final    Comment: Performed at  [facility], [place] [address], [place], [place] [address]  Culture, blood (routine x 2)     Status: Abnormal   Collection Time: 12/25/19 10:34 AM   Specimen: BLOOD RIGHT HAND  Result Value Ref Range Status   Specimen Description BLOOD RIGHT HAND  Final   Special Requests   Final    BOTTLES DRAWN AEROBIC ONLY Blood Culture adequate volume   Culture  Setup Time   Final    AEROBIC BOTTLE ONLY YEAST CRITICAL RESULT CALLED TO, READ BACK BY AND VERIFIED WITH: K HAMMONS PHARMD 12/26/19 2340 JDW    Culture (A)  Final    CANDIDA PARAPSILOSIS STAPHYLOCOCCUS AUREUS SUSCEPTIBILITIES PERFORMED ON PREVIOUS CULTURE WITHIN THE LAST 5 DAYS. Performed at [facility], [place] [address] [place], [place] [address]    Report Status 12/29/2019 FINAL  Final  Culture, blood (routine x 2)     Status: None   Collection Time: 12/27/19  9:20 AM   Specimen: BLOOD RIGHT ARM  Result Value Ref Range Status   Specimen Description BLOOD RIGHT ARM  Final   Special Requests   Final    BOTTLES DRAWN AEROBIC AND ANAEROBIC Blood Culture results may not be optimal due to an inadequate volume of blood received in culture bottles   Culture   Final    NO GROWTH 5 DAYS Performed at [facility], [place] [address], [place], [place] [address]    Report Status 01/01/2020 FINAL  Final  Culture, blood (routine x 2)     Status: None   Collection Time: 12/27/19  9:30 AM   Specimen: BLOOD LEFT HAND  Result Value Ref Range Status   Specimen Description BLOOD LEFT HAND  Final   Special Requests   Final    BOTTLES DRAWN AEROBIC ONLY Blood Culture adequate volume   Culture   Final    NO GROWTH 5 DAYS Performed at [facility], [place] [address] [place], [place] [address]    Report Status 01/01/2020 FINAL  Final  SARS CORONAVIRUS 2 (TAT 6-24 HRS) Nasopharyngeal Nasopharyngeal Swab     Status: None   Collection Time: 12/31/19  1:40 PM   Specimen: Nasopharyngeal  Swab  Result Value Ref Range Status   SARS Coronavirus 2 NEGATIVE NEGATIVE Final    Comment: (NOTE) SARS-CoV-2 target nucleic acids are NOT DETECTED. The SARS-CoV-2 RNA is generally detectable in upper and lower respiratory specimens during the acute phase of infection. Negative results do not preclude SARS-CoV-2 infection, do not rule out co-infections with other pathogens, and should not be used as the sole basis for treatment or other patient management decisions. Negative results must be combined with clinical observations, patient history, and epidemiological information. The expected result is Negative. Fact Sheet for Patients: SugarRoll.be Fact Sheet for Healthcare Providers: https://www.woods-mathews.com/ This test is not yet approved or cleared by the [place] FDA and  has been authorized for detection and/or diagnosis of SARS-CoV-2 by FDA under an Emergency Use Authorization (EUA). This EUA will remain  in effect (meaning this test can be used) for the duration of the COVID-19 declaration under Section 56 4(b)(1) of the Act, 21 U.S.C. section 360bbb-3(b)(1), unless the authorization is terminated or revoked sooner. Performed at [facility], [place] [address] [place], [place] [address]          Radiology Studies: No results found.      Scheduled Meds: . sodium chloride   Intravenous Once  . chlorhexidine gluconate (  MEDLINE KIT)  15 mL Mouth Rinse BID  . Chlorhexidine Gluconate Cloth  6 each Topical Daily  . feeding supplement (PRO-STAT SUGAR FREE 64)  30 mL Per Tube TID  . ferrous sulfate  300 mg Per Tube Q breakfast  . free water  250 mL Per Tube Q4H  . Gerhardt's butt cream  1 application Topical QID  . insulin aspart  0-15 Units Subcutaneous Q4H  . insulin aspart  5 Units Subcutaneous Q4H  . insulin glargine  6 Units Subcutaneous Daily  . ipratropium-albuterol  3 mL Nebulization TID  . levothyroxine  25  mcg Oral Q0600  . mouth rinse  15 mL Mouth Rinse 10 times per day  . nutrition supplement (JUVEN)  1 packet Per Tube BID  . nystatin  5 mL Oral TID AC & HS  . pantoprazole sodium  40 mg Per Tube Q1200  . rifampin  600 mg Per Tube q1800  . sodium chloride flush  10-40 mL Intracatheter Q12H   Continuous Infusions: . sodium chloride Stopped (01/02/20 0755)  . feeding supplement (JEVITY 1.5 CAL/FIBER) 1,000 mL (01/01/20 1626)  . fluconazole (DIFLUCAN) IV 100 mL/hr at 01/02/20 0900  . vancomycin Stopped (01/01/20 0745)          Aline August, MD [facility] 01/02/2020, 10:37 AM

## 2020-01-02 NOTE — Progress Notes (Signed)
Pharmacy Antibiotic Note  Gabrielle Wade is a 64 y.o. female with h/o quadraplegia admitted on 12/23/2019 with possible HCAP found to have ESBL E.coli, MRSA and Candida parapilosis bacteremia.  Pharmacy has been consulted for vancomycin dosing.     Renal function is stable, afebrile, WBC 2.9.  Plan: Vanc 1gm IV Q48H Fluconazole 400 IV Q24H  Rifampin 600mg  PT Q24H Monitor renal fxn, clinical progress, vanc levels at Css if not discharged   Height: 5\' 10"  (177.8 cm) Weight: 184 lb 1.4 oz (83.5 kg) IBW/kg (Calculated) : 68.5   Temp (24hrs), Avg:97.5 F (36.4 C), Min:97 F (36.1 C), Max:97.8 F (36.6 C)  Recent Labs  Lab 12/28/19 12/28/19 4503 12/29/19 0405 12/29/19 0405 12/29/19 0831 12/30/19 0458 12/31/19 0426 12/31/19 1326 01/01/20 0524 01/02/20 0708  WBC 0.6*  --  1.8*  --   --  2.8* 2.9*  --   --  4.2  CREATININE 1.22*   < > 0.97   < >  --  1.01* 1.02* 0.91 0.85 0.81  VANCOTROUGH  --   --   --   --   --  32*  --   --   --   --   VANCOPEAK  --   --   --   --  42*  --   --   --   --   --    < > = values in this interval not displayed.    Estimated Creatinine Clearance: 81.4 mL/min (by C-G formula based on SCr of 0.81 mg/dL).    Allergies  Allergen Reactions  . Codeine   . Hydrocodone   . Influenza A (H1n1) Monovalent Vaccine   . Latex   . Morphine And Related   . Penicillins   . Shellfish Allergy   . Tape    Vanc 1/4 >> (2/14) Cefepime 1/4 >> 1/4 Merrem 1/5 >> 1/11 Fluc 1/5 >> (1/22) Rifampin 1/6 >> Nystatin  1/11 VP/VT 42/32 on 1g q24 >> decr 1g q48  1/4 BCx - MRSA and Ecoli 1/4 UCx - Klebsiella and ESBL Ecoli 1/5 MRSA PCR - positive 1/6 BCx - Candida parapsilosis + MSSA 1/8 BCx - negative 1/12 covid - negative  Taige Housman D. 3/12, PharmD, BCPS, BCCCP 01/02/2020, 2:10 PM

## 2020-01-02 NOTE — Care Management (Addendum)
CM acknowledges consult for [facility] referral. Pt is from [facility] - chronic trach/vent.peg/quad.  LTACH  referral given to both [facility] and [facility].  [facility] is not in network with pts insurance.  [facility] is in network and will review pt. Per [facility] pt is not appropriate for LTACH due to;  Quad - no potential for therapy recovery, pt is chronic trach/vent/peg - no potential for recovery.

## 2020-01-03 LAB — BASIC METABOLIC PANEL
Anion gap: 7 (ref 5–15)
BUN: 98 mg/dL — ABNORMAL HIGH (ref 8–23)
CO2: 32 mmol/L (ref 22–32)
Calcium: 9 mg/dL (ref 8.9–10.3)
Chloride: 105 mmol/L (ref 98–111)
Creatinine, Ser: 0.73 mg/dL (ref 0.44–1.00)
GFR calc Af Amer: >60 mL/min (ref >60)
GFR calc non Af Amer: >60 mL/min (ref >60)
Glucose, Bld: 105 mg/dL — ABNORMAL HIGH (ref 70–99)
Potassium: 4.6 mmol/L (ref 3.5–5.1)
Sodium: 144 mmol/L (ref 135–145)

## 2020-01-03 LAB — CBC WITH DIFFERENTIAL/PLATELET
Abs Immature Granulocytes: 0 10*3/uL (ref 0.00–0.07)
Basophils Absolute: 0.2 10*3/uL — ABNORMAL HIGH (ref 0.0–0.1)
Basophils Relative: 5 %
Eosinophils Absolute: 0.4 10*3/uL (ref 0.0–0.5)
Eosinophils Relative: 10 %
HCT: 26.4 % — ABNORMAL LOW (ref 36.0–46.0)
Hemoglobin: 7.7 g/dL — ABNORMAL LOW (ref 12.0–15.0)
Lymphocytes Relative: 20 %
Lymphs Abs: 0.9 10*3/uL (ref 0.7–4.0)
MCH: 28.9 pg (ref 26.0–34.0)
MCHC: 29.2 g/dL — ABNORMAL LOW (ref 30.0–36.0)
MCV: 99.2 fL (ref 80.0–100.0)
Monocytes Absolute: 0.2 10*3/uL (ref 0.1–1.0)
Monocytes Relative: 4 %
Myelocytes: 1 %
Neutro Abs: 2.6 10*3/uL (ref 1.7–7.7)
Neutrophils Relative %: 60 %
Platelets: 199 10*3/uL (ref 150–400)
RBC: 2.66 MIL/uL — ABNORMAL LOW (ref 3.87–5.11)
RDW: 17.9 % — ABNORMAL HIGH (ref 11.5–15.5)
WBC: 4.3 10*3/uL (ref 4.0–10.5)
nRBC: 0 % (ref 0.0–0.2)
nRBC: 1 /100 WBC — ABNORMAL HIGH

## 2020-01-03 LAB — GLUCOSE, CAPILLARY
Glucose-Capillary: 111 mg/dL — ABNORMAL HIGH (ref 70–99)
Glucose-Capillary: 118 mg/dL — ABNORMAL HIGH (ref 70–99)
Glucose-Capillary: 130 mg/dL — ABNORMAL HIGH (ref 70–99)
Glucose-Capillary: 138 mg/dL — ABNORMAL HIGH (ref 70–99)
Glucose-Capillary: 86 mg/dL (ref 70–99)
Glucose-Capillary: 89 mg/dL (ref 70–99)

## 2020-01-03 LAB — HEPATIC FUNCTION PANEL
ALT: 19 U/L (ref 0–44)
AST: 16 U/L (ref 15–41)
Albumin: 1.3 g/dL — ABNORMAL LOW (ref 3.5–5.0)
Alkaline Phosphatase: 169 U/L — ABNORMAL HIGH (ref 38–126)
Bilirubin, Direct: <0.1 mg/dL (ref 0.0–0.2)
Total Bilirubin: 0.5 mg/dL (ref 0.3–1.2)
Total Protein: 6.9 g/dL (ref 6.5–8.1)

## 2020-01-03 LAB — MAGNESIUM: Magnesium: 2 mg/dL (ref 1.7–2.4)

## 2020-01-03 NOTE — Progress Notes (Signed)
Patient ID: Gabrielle Wade, female   DOB: 08/30/1956, 64 y.o.   MRN: [id number]  PROGRESS NOTE    Gabrielle Wade  YBO:[id number] DOB: [birth year] DOA: 12/23/2019 PCP: Townsend Roger, MD   Brief Narrative:  64 year old female with history of anxiety/depression, PTSD, MRSA epidural abscess, quadriplegia, diabetes mellitus type 2, chronic kidney disease stage III, COPD, chronic hypoxic respiratory failure on chronic trach/vent dependence, chronic dysphagia status post PEG tube, GERD, anemia presented with hemoglobin of 6.8 and WBC of 0.6 per [facility].  On presentation, chest x-ray showed bilateral airspace opacities.  She was found to have MRSA, ESBL E. coli bacteremia along with candidemia.  ID and oncology were consulted.  She was treated with meropenem, and vancomycin and Diflucan.  PCCM was consulted as well for trach/vent management.  Echo showed EF of 50 to 50% with grade 2 diastolic dysfunction.  He was transfused 1 unit of packed red cells on 12/28/2019.  Subsequently, she has remained stable and is waiting to be discharged back to SNF with vent facility.  Currently medically stable for discharge.  Assessment & Plan:   Sepsis: Present on admission -Resolved.  Currently hemodynamically stable.  Polymicrobial bloodstream infection including MRSA, ESBL E. coli bacteremia and candidemia with Candida parapsilosis -Source was probably right arm PICC line which was removed after admission.  Left heel x-ray did not show evidence of osteomyelitis.  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  Right upper extremity duplex was negative for DVT. -Has completed treatment for E. coli bacteremia with meropenem. -Currently on Diflucan and vancomycin.  ID has recommended vancomycin till 02/02/2020 along with oral rifampin to complete 6 weeks of therapy.  Continue Diflucan till 01/10/2020.  Repeat blood cultures from 12/27/2019  have been negative so far.  Neutropenia/anemia -Likely due to sepsis from bacteremia. -Hematology evaluated the patient during the hospitalization. -Status post 1 unit of packed red cell transfusion on 12/28/2019. -Subsequently white blood cell count has normalized. -Transfuse packed red cell if hemoglobin is less than 7. -Outpatient follow-up with hematology  Hypernatremia -Improving.  Sodium is 144 today.  Continue free water.  Monitor  Acute on chronic hypoxic respiratory failure/chronic vent dependence on tracheostomy -PCCM managing tracheostomy on ventilator.  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  Acute kidney injury -Resolved  Diabetes mellitus type 2 -Continue long-acting and short-acting insulin with CBGs with SSI  Hypoalbuminemia -Continue tube feeding as per dietary recommendations.  Generalized deconditioning -Overall prognosis is very poor.  Palliative care consultation requested. -Patient is currently medically stable for discharge to SNF with plan facility.  Care management/social worker working on the same.   DVT prophylaxis: Not on heparin/Lovenox because of anemia Code Status: Full Family Communication: None at bedside  disposition Plan: SNF with vent capacity once bed is available  Consultants: PCCM/ID/hematology/IR  Procedures: None  Antimicrobials:  Anti-infectives (From admission, onward)   Start     Dose/Rate Route Frequency Ordered Stop   01/01/20 0700  vancomycin (VANCOCIN) IVPB 1000 mg/200 mL premix     1,000 mg 200 mL/hr over 60 Minutes Intravenous Every 48 hours 12/30/19 0819     12/31/19 0000  vancomycin IVPB     1,000 mg Intravenous Every 48 hours 12/31/19 1244 02/03/20 2359   12/31/19 0000  fluconazole (DIFLUCAN) IVPB     400 mg Intravenous Every 24 hours 12/31/19 1244 01/11/20 2359  12/31/19 0000  rifampin (RIFADIN) 60 mg/mL SUSP oral suspension     600 mg Per  Tube Daily-1800 12/31/19 1244 02/03/20 2359   12/28/19 0800  fluconazole (DIFLUCAN) IVPB 200 mg  Status:  Discontinued     200 mg 100 mL/hr over 60 Minutes Intravenous Every 24 hours 12/27/19 0012 12/27/19 0724   12/28/19 0800  fluconazole (DIFLUCAN) IVPB 400 mg     400 mg 100 mL/hr over 120 Minutes Intravenous Every 24 hours 12/27/19 0724     12/27/19 0609  vancomycin (VANCOCIN) IVPB 1000 mg/200 mL premix  Status:  Discontinued     1,000 mg 200 mL/hr over 60 Minutes Intravenous Every 24 hours 12/26/19 0742 12/30/19 0740   12/27/19 0230  fluconazole (DIFLUCAN) IVPB 400 mg     400 mg 100 mL/hr over 120 Minutes Intravenous Every 1 hr x 2 12/27/19 0222 12/27/19 0714   12/27/19 0100  fluconazole (DIFLUCAN) IVPB 800 mg  Status:  Discontinued     800 mg 200 mL/hr over 120 Minutes Intravenous  Once 12/27/19 0012 12/27/19 0222   12/26/19 2200  meropenem (MERREM) 1 g in sodium chloride 0.9 % 100 mL IVPB     1 g 200 mL/hr over 30 Minutes Intravenous Every 12 hours 12/26/19 1337 12/30/19 2333   12/26/19 1800  rifampin (RIFADIN) 60 mg/mL oral suspension 600 mg     600 mg Per Tube Daily-1800 12/25/19 2207     12/26/19 1000  fluconazole (DIFLUCAN) tablet 200 mg  Status:  Discontinued     200 mg Per Tube Daily 12/26/19 0514 12/27/19 0012   12/26/19 0600  vancomycin (VANCOCIN) IVPB 1000 mg/200 mL premix  Status:  Discontinued     1,000 mg 200 mL/hr over 60 Minutes Intravenous Every 48 hours 12/24/19 0512 12/26/19 0742   12/25/19 1900  meropenem (MERREM) 1 g in sodium chloride 0.9 % 100 mL IVPB  Status:  Discontinued     1 g 200 mL/hr over 30 Minutes Intravenous Every 8 hours 12/25/19 1838 12/26/19 1337   12/25/19 1630  rifampin (RIFADIN) 60 mg/mL oral suspension 600 mg  Status:  Discontinued     600 mg Per Tube Daily 12/25/19 1553 12/25/19 2207   12/24/19 2250  meropenem (MERREM) 1 g in sodium chloride 0.9 % 100 mL IVPB  Status:  Discontinued     1 g 200 mL/hr over 30 Minutes Intravenous Every 8  hours 12/24/19 1942 12/25/19 1838   12/24/19 1815  fluconazole (DIFLUCAN) 40 MG/ML suspension 200 mg  Status:  Discontinued     200 mg Per Tube Daily 12/24/19 1804 12/26/19 0514   12/24/19 1445  fluconazole (DIFLUCAN) 40 MG/ML suspension 200 mg  Status:  Discontinued     200 mg Per Tube Daily 12/24/19 1436 12/24/19 1804   12/24/19 1000  ceFEPIme (MAXIPIME) 2 g in sodium chloride 0.9 % 100 mL IVPB  Status:  Discontinued     2 g 200 mL/hr over 30 Minutes Intravenous Every 12 hours 12/24/19 0512 12/24/19 0859   12/24/19 1000  meropenem (MERREM) 1 g in sodium chloride 0.9 % 100 mL IVPB  Status:  Discontinued     1 g 200 mL/hr over 30 Minutes Intravenous Every 12 hours 12/24/19 0859 12/24/19 1942   12/24/19 0315  vancomycin (VANCOREADY) IVPB 1250 mg/250 mL     1,250 mg 166.7 mL/hr over 90 Minutes Intravenous  Once 12/24/19 0250 12/24/19 0819   12/23/19 2045  ceFEPIme (MAXIPIME) 2 g in sodium chloride 0.9 %  100 mL IVPB     2 g 200 mL/hr over 30 Minutes Intravenous  Once 12/23/19 2030 12/23/19 2201       Subjective: Patient seen and examined at bedside.  No worsening shortness of breath, fever or vomiting reported.  Awake and nods her head to some questions.  Poor historian.   Objective: Vitals:   01/03/20 0400 01/03/20 0408 01/03/20 0500 01/03/20 0600  BP: (!) 114/54  (!) 120/54 112/60  Pulse: 73  65 68  Resp: _0 Temp:      TempSrc:      SpO2: 100% 100% 100% 99%  Weight:      Height:        Intake/Output Summary (Last 24 hours) at 01/03/2020 0730 Last data filed at 01/03/2020 0600 Gross per 24 hour  Intake 2389.3 ml  Output 2020 ml  Net 369.3 ml   Filed Weights   01/01/20 0410 01/02/20 0424 01/03/20 0338  Weight: 92 kg 83.5 kg 81.5 kg    Examination:  General exam: Poor historian.  Looks chronically ill.  No acute distress Neck: Trach present.  On ventilator via trach Respiratory system: Bilateral decreased breath sounds at bases with crackles.  No wheezing   cardiovascular system: Rate controlled, S1-S2 heard Gastrointestinal system: Abdomen is nondistended, soft and nontender. Normal bowel sounds heard.  PEG tube present Extremities: No cyanosis; trace bilateral lower extremity edema Central nervous system: Awake and nods her head to some questions.  Quadriplegic.   Lymph: No cervical lymphadenopathy Psychiatry: Has extremely flat affect    Data Reviewed: I have personally reviewed following labs and imaging studies  CBC: Recent Labs  Lab 12/29/19 0405 12/29/19 0405 12/30/19 0458 12/31/19 0426 12/31/19 1037 01/02/20 0708 01/03/20 0531  WBC 1.8*  --  2.8* 2.9*  --  4.2 4.3  NEUTROABS  --   --   --   --   --  1.8 2.6  HGB 7.4*   < > 7.5* 7.6* 8.5* 7.7* 7.7*  HCT 24.2*   < > 26.5* 26.7* 25.0* 26.9* 26.4*  MCV 96.8  --  100.8* 101.9*  --  100.7* 99.2  PLT 148*  --  155 164  --  196 199   < > = values in this interval not displayed.   Basic Metabolic Panel: Recent Labs  Lab 12/31/19 0426 12/31/19 0426 12/31/19 1037 12/31/19 1326 01/01/20 0524 01/02/20 0708 01/03/20 0531  NA 151*   < > 152* 149* 147* 149* 144  K 4.2   < > 4.4 4.2 4.1 4.4 4.6  CL 113*  --   --  112* 108 110 105  CO2 34*  --   --  33* 33* 33* 32  GLUCOSE 122*  --   --  184* 149* 146* 105*  BUN 104*  --   --  106* 101* 98* 98*  CREATININE 1.02*  --   --  0.91 0.85 0.81 0.73  CALCIUM 9.3  --   --  9.3 9.3 9.1 9.0  MG  --   --   --   --   --  2.1 2.0   < > = values in this interval not displayed.   GFR: Estimated Creatinine Clearance: 75.8 mL/min (by C-G formula based on SCr of 0.73 mg/dL). Liver Function Tests: Recent Labs  Lab 12/28/19 0852 12/30/19 0458 12/31/19 0426 01/01/20 0524 01/03/20 0531  AST _1 ALT _2 ALKPHOS 185*  183* 184* 170* 169*  BILITOT 1.0 1.0 0.6 0.6 0.5  PROT 6.4* 7.0 7.0 7.2 6.9  ALBUMIN 1.1* 1.3* 1.2* 1.3* 1.3*   No results for input(s): LIPASE, AMYLASE in the last 168 hours. No results for  input(s): AMMONIA in the last 168 hours. Coagulation Profile: No results for input(s): INR, PROTIME in the last 168 hours. Cardiac Enzymes: No results for input(s): CKTOTAL, CKMB, CKMBINDEX, TROPONINI in the last 168 hours. BNP (last 3 results) No results for input(s): PROBNP in the last 8760 hours. HbA1C: No results for input(s): HGBA1C in the last 72 hours. CBG: Recent Labs  Lab 01/02/20 1119 01/02/20 1520 01/02/20 1953 01/02/20 2328 01/03/20 0330  GLUCAP 107* 161* 126* 122* 111*   Lipid Profile: No results for input(s): CHOL, HDL, LDLCALC, TRIG, CHOLHDL, LDLDIRECT in the last 72 hours. Thyroid Function Tests: No results for input(s): TSH, T4TOTAL, FREET4, T3FREE, THYROIDAB in the last 72 hours. Anemia Panel: No results for input(s): VITAMINB12, FOLATE, FERRITIN, TIBC, IRON, RETICCTPCT in the last 72 hours. Sepsis Labs: No results for input(s): PROCALCITON, LATICACIDVEN in the last 168 hours.  Recent Results (from the past 240 hour(s))  MRSA PCR Screening     Status: Abnormal   Collection Time: 12/24/19  5:50 PM   Specimen: Nasal Mucosa; Nasopharyngeal  Result Value Ref Range Status   MRSA by PCR POSITIVE (A) NEGATIVE Final    Comment:        The GeneXpert MRSA Assay (FDA approved for NASAL specimens only), is one component of a comprehensive MRSA colonization surveillance program. It is not intended to diagnose MRSA infection nor to guide or monitor treatment for MRSA infections. RESULT CALLED TO, READ BACK BY AND VERIFIED WITH: Everlean Alstrom RN 2017 12/24/19 A BROWNING Performed at [facility], [place] [address], [place], [place] [address]   Culture, blood (routine x 2)     Status: Abnormal   Collection Time: 12/25/19 10:26 AM   Specimen: BLOOD RIGHT HAND  Result Value Ref Range Status   Specimen Description BLOOD RIGHT HAND  Final   Special Requests   Final    BOTTLES DRAWN AEROBIC ONLY Blood Culture adequate volume   Culture  Setup Time   Final    AEROBIC  BOTTLE ONLY YEAST CRITICAL RESULT CALLED TO, READ BACK BY AND VERIFIED WITH: Carm[facility] 12/26/19 2340 JDW Performed at [facility], [place] [address] [place], [place] [address]    Culture CANDIDA PARAPSILOSIS (A)  Final   Report Status 12/28/2019 FINAL  Final  Blood Culture ID Panel (Reflexed)     Status: Abnormal   Collection Time: 12/25/19 10:26 AM  Result Value Ref Range Status   Enterococcus species NOT DETECTED NOT DETECTED Final   Listeria monocytogenes NOT DETECTED NOT DETECTED Final   Staphylococcus species NOT DETECTED NOT DETECTED Final   Staphylococcus aureus (BCID) NOT DETECTED NOT DETECTED Final   Streptococcus species NOT DETECTED NOT DETECTED Final   Streptococcus agalactiae NOT DETECTED NOT DETECTED Final   Streptococcus pneumoniae NOT DETECTED NOT DETECTED Final   Streptococcus pyogenes NOT DETECTED NOT DETECTED Final   Acinetobacter baumannii NOT DETECTED NOT DETECTED Final   Enterobacteriaceae species NOT DETECTED NOT DETECTED Final   Enterobacter cloacae complex NOT DETECTED NOT DETECTED Final   Escherichia coli NOT DETECTED NOT DETECTED Final   Klebsiella oxytoca NOT DETECTED NOT DETECTED Final   Klebsiella pneumoniae NOT DETECTED NOT DETECTED Final   Proteus species NOT DETECTED NOT DETECTED Final  Serratia marcescens NOT DETECTED NOT DETECTED Final   Haemophilus influenzae NOT DETECTED NOT DETECTED Final   Neisseria meningitidis NOT DETECTED NOT DETECTED Final   Pseudomonas aeruginosa NOT DETECTED NOT DETECTED Final   Candida albicans NOT DETECTED NOT DETECTED Final   Candida glabrata NOT DETECTED NOT DETECTED Final   Candida krusei NOT DETECTED NOT DETECTED Final   Candida parapsilosis DETECTED (A) NOT DETECTED Final    Comment: CRITICAL RESULT CALLED TO, READ BACK BY AND VERIFIED WITH: K HAMMONS PHARMD 12/26/19 2340 JDW    Candida tropicalis NOT DETECTED NOT DETECTED Final    Comment: Performed at [place] [address]  [place], [place] [address]  Culture, blood (routine x 2)     Status: Abnormal   Collection Time: 12/25/19 10:34 AM   Specimen: BLOOD RIGHT HAND  Result Value Ref Range Status   Specimen Description BLOOD RIGHT HAND  Final   Special Requests   Final    BOTTLES DRAWN AEROBIC ONLY Blood Culture adequate volume   Culture  Setup Time   Final    AEROBIC BOTTLE ONLY YEAST CRITICAL RESULT CALLED TO, READ BACK BY AND VERIFIED WITH: K HAMMONS PHARMD 12/26/19 2340 JDW    Culture (A)  Final    CANDIDA PARAPSILOSIS STAPHYLOCOCCUS AUREUS SUSCEPTIBILITIES PERFORMED ON PREVIOUS CULTURE WITHIN THE LAST 5 DAYS. Performed at [facility], [place] [address], [place], [place] [address]    Report Status 12/29/2019 FINAL  Final  Culture, blood (routine x 2)     Status: None   Collection Time: 12/27/19  9:20 AM   Specimen: BLOOD RIGHT ARM  Result Value Ref Range Status   Specimen Description BLOOD RIGHT ARM  Final   Special Requests   Final    BOTTLES DRAWN AEROBIC AND ANAEROBIC Blood Culture results may not be optimal due to an inadequate volume of blood received in culture bottles   Culture   Final    NO GROWTH 5 DAYS Performed at [facility], [place] [address] [place], [place] [address]    Report Status 01/01/2020 FINAL  Final  Culture, blood (routine x 2)     Status: None   Collection Time: 12/27/19  9:30 AM   Specimen: BLOOD LEFT HAND  Result Value Ref Range Status   Specimen Description BLOOD LEFT HAND  Final   Special Requests   Final    BOTTLES DRAWN AEROBIC ONLY Blood Culture adequate volume   Culture   Final    NO GROWTH 5 DAYS Performed at [facility], [place] [address] [place], [place] [address]    Report Status 01/01/2020 FINAL  Final  SARS CORONAVIRUS 2 (TAT 6-24 HRS) Nasopharyngeal Nasopharyngeal Swab     Status: None   Collection Time: 12/31/19  1:40 PM   Specimen: Nasopharyngeal Swab  Result Value Ref Range Status   SARS Coronavirus 2 NEGATIVE NEGATIVE Final    Comment:  (NOTE) SARS-CoV-2 target nucleic acids are NOT DETECTED. The SARS-CoV-2 RNA is generally detectable in upper and lower respiratory specimens during the acute phase of infection. Negative results do not preclude SARS-CoV-2 infection, do not rule out co-infections with other pathogens, and should not be used as the sole basis for treatment or other patient management decisions. Negative results must be combined with clinical observations, patient history, and epidemiological information. The expected result is Negative. Fact Sheet for Patients: SugarRoll.be Fact Sheet for Healthcare Providers: https://www.woods-mathews.com/ This test is not yet approved or cleared by  the [employer] and  has been authorized for detection and/or diagnosis of SARS-CoV-2 by FDA under an Emergency Use Authorization (EUA). This EUA will remain  in effect (meaning this test can be used) for the duration of the COVID-19 declaration under Section 56 4(b)(1) of the Act, 21 U.S.C. section 360bbb-3(b)(1), unless the authorization is terminated or revoked sooner. Performed at [facility], [place] [address], [place], [place] [address]          Radiology Studies: No results found.      Scheduled Meds: . sodium chloride   Intravenous Once  . chlorhexidine gluconate (MEDLINE KIT)  15 mL Mouth Rinse BID  . Chlorhexidine Gluconate Cloth  6 each Topical Daily  . feeding supplement (PRO-STAT SUGAR FREE 64)  30 mL Per Tube TID  . ferrous sulfate  300 mg Per Tube Q breakfast  . free water  300 mL Per Tube Q4H  . Gerhardt's butt cream  1 application Topical QID  . insulin aspart  0-15 Units Subcutaneous Q4H  . insulin aspart  5 Units Subcutaneous Q4H  . insulin glargine  6 Units Subcutaneous Daily  . ipratropium-albuterol  3 mL Nebulization TID  . levothyroxine  25 mcg Oral Q0600  . mouth rinse  15 mL Mouth Rinse 10 times per day  . nutrition supplement  (JUVEN)  1 packet Per Tube BID  . nystatin  5 mL Oral TID AC & HS  . pantoprazole sodium  40 mg Per Tube Q1200  . rifampin  600 mg Per Tube q1800  . sodium chloride flush  10-40 mL Intracatheter Q12H   Continuous Infusions: . sodium chloride Stopped (01/02/20 0755)  . feeding supplement (JEVITY 1.5 CAL/FIBER) 1,000 mL (01/02/20 1528)  . fluconazole (DIFLUCAN) IV Stopped (01/02/20 0955)  . vancomycin 1,000 mg (01/03/20 1239)          Aline August, MD [facility] 01/03/2020, 7:30 AM

## 2020-01-03 NOTE — Progress Notes (Signed)
  Speech Language Pathology Treatment: Gabrielle Wade Speaking valve  Patient Details Name: Gabrielle Wade MRN: [id number] DOB: 05-29-1956 Today's Date: 01/03/2020 Time: 0912-0923 SLP Time Calculation (min) (ACUTE ONLY): 11 min  Assessment / Plan / Recommendation Clinical Impression  Collaborative session with RT moving towards goal of increase tolerance of PMV while on ventilator. RT deflated cuff revealing patent airway and adequate exhalatory volumes, turned PEEP to 0. Pt was very anxious, verbalizing her fear in audible, normal vocal quality with improved respiratory/speech coordination. Spo2 98%, RR 22 and HR mid 80's. Request to express her questions, thoughts resulted in continuous requests to remove valve and fear despite encouragement/reinforcement re: safety with valve and therapists. After approx 5 and 3 min intervals with valve, it was doffed. Pt scheduled to discharge to LTAC. Speech path to continue to work with pt on inline PMV moving toward increased tolerance and eventual repeat instrumental swallow when appropriate.     HPI HPI: Gabrielle Wade  is a 64 y.o. female, w anxiety/ depression, PTSD,  hx of epidural abscess (mrsa), w quadriplegia , Dm2, CKD stage3,  Copd, Chronic Trach/ vent dependence, GERD, chronic dysphagia, PEG, Anemia, presents with ? Thrombocytopenia, Hgb 6.8, and wbc 0.6 per [facility]. Found to have sepsis, neutropenia, AKI, hypothermia, hypoxia, bacteremia. NO ST notes found in system.       SLP Plan  Continue with current plan of care       Recommendations         Patient may use Passy-Muir Speech Valve: with SLP only PMSV Supervision: Full         Oral Care Recommendations: Oral care QID Follow up Recommendations: LTACH SLP Visit Diagnosis: Aphonia (R49.1) Plan: Continue with current plan of care       GO                Royce Macadamia 01/03/2020, 11:03 AM  Breck Coons Lonell Face.Ed Nurse, children's  [phone] Office [phone]

## 2020-01-03 NOTE — Progress Notes (Signed)
NAME:  Gabrielle Wade, MRN:  [id number], DOB:  1956/11/18, LOS: 76 ADMISSION DATE:  12/23/2019, CONSULTATION DATE:  12/22/2018 REFERRING MD:  Dr. [name], CHIEF COMPLAINT:  Chronic vent management  Brief History    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 She is ventilator depended due to respiratory failure and was transferred from [facility] for evaluation of her leukopenia and anemia. PCCM was consulted by [facility] for chronic tracheostomy and ventilator management.   History of present illness   HPI obtained from medical chart review as patient is trach/ mechanical ventilator dependent.  No bedside medical records found, therefore limited HPI.   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  In ER, hemodynamically stable and initial temp 95.5.  Patient is awake in no distress on ventilator with settings PRVC, rate 12, FiO2 30%, TV 480, PEEP 5.  Unknown if patient weans.  Workup notable for WBC 0.8, Hgb 6.2, platelets 255, glucose 177, BUN 69, sCr 1.24, alk phos 247, albumin 1.2, corrected calcium 10.8, trop hs 5, lactic acid 1, normal coags, TSH 24.57, free T4 0.66, FOB negative, SARS2 PCR neg, UA negative.  CXR showing cardiomegaly with moderate bilateral pleural effusions, bilateral airspace and bibasilar opacities concerning for developing edema vs atypical infectious process vs atelectasis vs developing infiltrate.  TRH to admit, PCCM consulted for vent and tracheostomy management.   Past Medical History  Cervical epidural abscess- MRSA in 08/2019 with resultant quadriplegia with tracheostomy and ventilator dependent respiratory failure,  PEG, DMT2, CKD stage III, COPD, GERD, anxiety/ depression, PTSD  Significant Hospital Events   1/4 Admit [facility]  Consults:  PCCM - trach / vent   Procedures:  pta trach pta PEG   Significant Diagnostic Tests:  12/24/19 Complete Echocardiogram:  -LVEF 55-60% - Grade II Diastolic Dysfunction Micro Data:  1/4 SARS 2/ Flu A/B >> neg 1/4 UC >> >100K cfus of E coli and klebsiella pneumonia 1/4 BCx 2 >> preliminary staph species methicillin-resistant 1/4 urine strep >> (-) 1/4 urine legionella >> (-)  Antimicrobials:  1/4 cefepime >> 1/6 1/4 vanc >>1/14 1/6 meropenem >> 1/11 1/10 Rifampin>>1/14  Interim history/subjective:   O/N Events: None No complaints overnight, we discussed her transition to [place] and that case management was waiting to hear back from [place] today.   Objective   Blood pressure 112/60, pulse 68, temperature (!) 97 F (36.1 C), temperature source Oral, resp. rate 14, height _0  (1.778 m), weight 81.5 kg, SpO2 99 %.    Vent Mode: PRVC FiO2 (%):  [30 %] 30 % Set Rate:  [12 bmp] 12 bmp Vt Set:  [480 mL] 480 mL PEEP:  [5 cmH20] 5 cmH20 Plateau Pressure:  [18 cmH20-21 cmH20] 21 cmH20   Intake/Output Summary (Last 24 hours) at 01/03/2020 0651 Last data filed at 01/03/2020 0600 Gross per 24 hour  Intake 2389.3 ml  Output 2020 ml  Net 369.3 ml    Examination: General: Awake in bed, able to answer questions by shaking and nodding her head to questions.  HEENT: normocephalic, atraumatic, trach intact, no scleral icterus noted.  Cardio: RRR no murmurs, rubs, or gallops Pulmonary: Clear to auscultation bilaterally, no rales, wheezes, or rhonchi appreciated.  Abdomen: BS present, non distended, non tender to palpation MSK: no pitting edema noted bilaterally in the lower extremeties Neuro: able to answer questions appropriately with nodding and shaking of her head Skin: warm and dry.   Resolved Hospital Problem list     Assessment & Plan:   Chronic  Respiratory Failure with Tracheostomy and PEG 2/2 to Cervical Epidural Abscess Resulting in Quadriplegia Requiring Ventilation Support:   - Full vent support, no weaning - Current vent settings 40/5/12/480 - Titrate O2 for sat of 88-92% - Maintain current trach size and type  HCAP with Sepsis (Resolved):  - Patient Hemodynamically Stable - Blood Cultures with MRSA - Urine Cultures + E. Coli and Klebsiella - On Vancomycin and Rifampin  - Complete meropenem for e. Coli.   Candidemia: - Continue on Diflucan until 01/10/20  Neutropenia:  - Likely 2/2 to sepsis from bacteremia  - WBC 4.3  Caloric Malnutrition:  -Continue tube feeds   [name], MD IMTS, PGY-1   Best practice:  Diet: NPO, PEG tube feeds resumed Pain/Anxiety/Delirium protocol (if indicated): not needed VAP protocol (if indicated): yes DVT prophylaxis: SCDs GI prophylaxis: PPI Glucose control: CBG q 4, SSI Mobility: PT/ OT Code Status: full  Family Communication: per primary, patient updated on plan of care Disposition: admit [facility]/ PCCM for chronic vent managment  Labs   CBC: Recent Labs  Lab 12/29/19 0405 12/29/19 0405 12/30/19 0458 12/31/19 0426 12/31/19 1037 01/02/20 0708 01/03/20 0531  WBC 1.8*  --  2.8* 2.9*  --  4.2 4.3  NEUTROABS  --   --   --   --   --  1.8 2.6  HGB 7.4*   < > 7.5* 7.6* 8.5* 7.7* 7.7*  HCT 24.2*   < > 26.5* 26.7* 25.0* 26.9* 26.4*  MCV 96.8  --  100.8* 101.9*  --  100.7* 99.2  PLT 148*  --  155 164  --  196 199   < > = values in this interval not displayed.    Basic Metabolic Panel: Recent Labs  Lab 12/31/19 0426 12/31/19 0426 12/31/19 1037 12/31/19 1326 01/01/20 0524 01/02/20 0708 01/03/20 0531  NA 151*   < > 152* 149* 147* 149* 144  K 4.2   < > 4.4 4.2 4.1 4.4 4.6  CL 113*  --   --  112* 108 110 105  CO2 34*  --   --  33* 33* 33* 32  GLUCOSE 122*  --   --  184* 149* 146* 105*  BUN 104*  --   --  106* 101* 98* 98*  CREATININE 1.02*  --   --  0.91 0.85  0.81 0.73  CALCIUM 9.3  --   --  9.3 9.3 9.1 9.0  MG  --   --   --   --   --  2.1 2.0   < > = values in this interval not displayed.   GFR: Estimated Creatinine Clearance: 75.8 mL/min (by C-G formula based on SCr of 0.73 mg/dL). Recent Labs  Lab 12/30/19 0458 12/31/19 0426 01/02/20 0708 01/03/20 0531  WBC 2.8* 2.9* 4.2 4.3    Liver Function Tests: Recent Labs  Lab 12/28/19 5277 12/30/19 0458 12/31/19 0426 01/01/20 0524 01/03/20 0531  AST _0 ALT _1 ALKPHOS 185* 183* 184* 170* 169*  BILITOT 1.0 1.0 0.6 0.6 0.5  PROT 6.4* 7.0 7.0 7.2 6.9  ALBUMIN 1.1* 1.3* 1.2* 1.3* 1.3*  No results for input(s): LIPASE, AMYLASE in the last 168 hours. No results for input(s): AMMONIA in the last 168 hours.  ABG    Component Value Date/Time   PHART 7.420 12/31/2019 1037   PCO2ART 53.5 (H) 12/31/2019 1037   PO2ART 43.0 (L) 12/31/2019 1037   HCO3 34.9 (H) 12/31/2019 1037   TCO2 37 (H) 12/31/2019 1037   O2SAT 80.0 12/31/2019 1037     Coagulation Profile: No results for input(s): INR, PROTIME in the last 168 hours.  Cardiac Enzymes: No results for input(s): CKTOTAL, CKMB, CKMBINDEX, TROPONINI in the last 168 hours.  HbA1C: No results found for: HGBA1C  CBG: Recent Labs  Lab 01/02/20 1119 01/02/20 1520 01/02/20 1953 01/02/20 2328 01/03/20 0330  GLUCAP 107* 161* 126* 122* 111*

## 2020-01-03 NOTE — Progress Notes (Addendum)
11:45am: CSW received return call from [place] who states the patient is still under review at this time.  11am: CSW left voicemail with Coralee North at [facility] requesting a return call to determine acceptance or denial at the facility.  10am: CSW spoke with Damian Leavell at [facility] who states this patient is still under review by respiratory at this time. Damian Leavell will return call to CSW once a decision is made.  Edwin Dada, MSW, LCSW-A Transitions of Care  Clinical Social Worker  [facility] Emergency Departments  Medical ICU [phone]

## 2020-01-04 LAB — GLUCOSE, CAPILLARY
Glucose-Capillary: 95 mg/dL (ref 70–99)
Glucose-Capillary: 114 mg/dL — ABNORMAL HIGH (ref 70–99)
Glucose-Capillary: 126 mg/dL — ABNORMAL HIGH (ref 70–99)
Glucose-Capillary: 106 mg/dL — ABNORMAL HIGH (ref 70–99)
Glucose-Capillary: 116 mg/dL — ABNORMAL HIGH (ref 70–99)

## 2020-01-04 NOTE — Progress Notes (Signed)
Patient ID: Gabrielle Wade, female   DOB: August 02, 1956, 64 y.o.   MRN: [id number]  PROGRESS NOTE    Gabrielle Wade  RPR:[id number] DOB: 10-24-1956 DOA: 12/23/2019 PCP: Townsend Roger, MD   Brief Narrative:  64 year old female with history of anxiety/depression, PTSD, MRSA epidural abscess, quadriplegia, diabetes mellitus type 2, chronic kidney disease stage III, COPD, chronic hypoxic respiratory failure on chronic trach/vent dependence, chronic dysphagia status post PEG tube, GERD, anemia presented with hemoglobin of 6.8 and WBC of 0.6 per [facility].  On presentation, chest x-ray showed bilateral airspace opacities.  She was found to have MRSA, ESBL E. coli bacteremia along with candidemia.  ID and oncology were consulted.  She was treated with meropenem, and vancomycin and Diflucan.  PCCM was consulted as well for trach/vent management.  Echo showed EF of 50 to 50% with grade 2 diastolic dysfunction.  He was transfused 1 unit of packed red cells on 12/28/2019.  Subsequently, she has remained stable and is waiting to be discharged back to SNF with vent facility.  Currently medically stable for discharge.  Assessment & Plan:   Sepsis: Present on admission -Resolved.  Currently hemodynamically stable.  Polymicrobial bloodstream infection including MRSA, ESBL E. coli bacteremia and candidemia with Candida parapsilosis -Source was probably right arm PICC line which was removed after admission.  Left heel x-ray did not show evidence of osteomyelitis.  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  Right upper extremity duplex was negative for DVT. -Has completed treatment for E. coli bacteremia with meropenem. -Currently on Diflucan and vancomycin.  ID has recommended vancomycin till 02/02/2020 along with oral rifampin to complete 6 weeks of therapy.  Continue Diflucan till 01/10/2020.  Repeat blood cultures from 12/27/2019  have been negative so far.  Neutropenia/anemia -Likely due to sepsis from bacteremia. -Hematology evaluated the patient during the hospitalization. -Status post 1 unit of packed red cell transfusion on 12/28/2019. -Subsequently white blood cell count has normalized. -Transfuse packed red cell if hemoglobin is less than 7. -Outpatient follow-up with hematology  Hypernatremia -Improving.  No labs today.  Continue free water.  Monitor  Acute on chronic hypoxic respiratory failure/chronic vent dependence on tracheostomy -PCCM managing tracheostomy on ventilator.  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  Acute kidney injury -Resolved  Diabetes mellitus type 2 -Continue long-acting and short-acting insulin with CBGs with SSI  Hypoalbuminemia -Continue tube feeding as per dietary recommendations.  Generalized deconditioning -Overall prognosis is very poor.  Palliative care consultation requested. -Patient is currently medically stable for discharge to SNF with plan facility.  Care management/social worker working on the same.   DVT prophylaxis: Not on heparin/Lovenox because of anemia Code Status: Full Family Communication: None at bedside  disposition Plan: SNF with vent capacity once bed is available  Consultants: PCCM/ID/hematology/IR  Procedures: None  Antimicrobials:  Anti-infectives (From admission, onward)   Start     Dose/Rate Route Frequency Ordered Stop   01/01/20 0700  vancomycin (VANCOCIN) IVPB 1000 mg/200 mL premix     1,000 mg 200 mL/hr over 60 Minutes Intravenous Every 48 hours 12/30/19 0819     12/31/19 0000  vancomycin IVPB     1,000 mg Intravenous Every 48 hours 12/31/19 1244 02/03/20 2359   12/31/19 0000  fluconazole (DIFLUCAN) IVPB     400 mg Intravenous Every 24 hours 12/31/19 1244 01/11/20 2359  12/31/19 0000  rifampin (RIFADIN) 60 mg/mL SUSP oral suspension     600 mg Per Tube  Daily-1800 12/31/19 1244 02/03/20 2359   12/28/19 0800  fluconazole (DIFLUCAN) IVPB 200 mg  Status:  Discontinued     200 mg 100 mL/hr over 60 Minutes Intravenous Every 24 hours 12/27/19 0012 12/27/19 0724   12/28/19 0800  fluconazole (DIFLUCAN) IVPB 400 mg     400 mg 100 mL/hr over 120 Minutes Intravenous Every 24 hours 12/27/19 0724     12/27/19 0609  vancomycin (VANCOCIN) IVPB 1000 mg/200 mL premix  Status:  Discontinued     1,000 mg 200 mL/hr over 60 Minutes Intravenous Every 24 hours 12/26/19 0742 12/30/19 0740   12/27/19 0230  fluconazole (DIFLUCAN) IVPB 400 mg     400 mg 100 mL/hr over 120 Minutes Intravenous Every 1 hr x 2 12/27/19 0222 12/27/19 0714   12/27/19 0100  fluconazole (DIFLUCAN) IVPB 800 mg  Status:  Discontinued     800 mg 200 mL/hr over 120 Minutes Intravenous  Once 12/27/19 0012 12/27/19 0222   12/26/19 2200  meropenem (MERREM) 1 g in sodium chloride 0.9 % 100 mL IVPB     1 g 200 mL/hr over 30 Minutes Intravenous Every 12 hours 12/26/19 1337 12/30/19 2333   12/26/19 1800  rifampin (RIFADIN) 60 mg/mL oral suspension 600 mg     600 mg Per Tube Daily-1800 12/25/19 2207     12/26/19 1000  fluconazole (DIFLUCAN) tablet 200 mg  Status:  Discontinued     200 mg Per Tube Daily 12/26/19 0514 12/27/19 0012   12/26/19 0600  vancomycin (VANCOCIN) IVPB 1000 mg/200 mL premix  Status:  Discontinued     1,000 mg 200 mL/hr over 60 Minutes Intravenous Every 48 hours 12/24/19 0512 12/26/19 0742   12/25/19 1900  meropenem (MERREM) 1 g in sodium chloride 0.9 % 100 mL IVPB  Status:  Discontinued     1 g 200 mL/hr over 30 Minutes Intravenous Every 8 hours 12/25/19 1838 12/26/19 1337   12/25/19 1630  rifampin (RIFADIN) 60 mg/mL oral suspension 600 mg  Status:  Discontinued     600 mg Per Tube Daily 12/25/19 1553 12/25/19 2207   12/24/19 2250  meropenem (MERREM) 1 g in sodium chloride 0.9 % 100 mL IVPB  Status:  Discontinued     1 g 200 mL/hr over 30 Minutes Intravenous Every 8 hours  12/24/19 1942 12/25/19 1838   12/24/19 1815  fluconazole (DIFLUCAN) 40 MG/ML suspension 200 mg  Status:  Discontinued     200 mg Per Tube Daily 12/24/19 1804 12/26/19 0514   12/24/19 1445  fluconazole (DIFLUCAN) 40 MG/ML suspension 200 mg  Status:  Discontinued     200 mg Per Tube Daily 12/24/19 1436 12/24/19 1804   12/24/19 1000  ceFEPIme (MAXIPIME) 2 g in sodium chloride 0.9 % 100 mL IVPB  Status:  Discontinued     2 g 200 mL/hr over 30 Minutes Intravenous Every 12 hours 12/24/19 0512 12/24/19 0859   12/24/19 1000  meropenem (MERREM) 1 g in sodium chloride 0.9 % 100 mL IVPB  Status:  Discontinued     1 g 200 mL/hr over 30 Minutes Intravenous Every 12 hours 12/24/19 0859 12/24/19 1942   12/24/19 0315  vancomycin (VANCOREADY) IVPB 1250 mg/250 mL     1,250 mg 166.7 mL/hr over 90 Minutes Intravenous  Once 12/24/19 0250 12/24/19 0819   12/23/19 2045  ceFEPIme (MAXIPIME) 2 g in sodium chloride 0.9 %  100 mL IVPB     2 g 200 mL/hr over 30 Minutes Intravenous  Once 12/23/19 2030 12/23/19 2201       Subjective: Patient seen and examined at bedside.  Poor historian.  Awake and nods her head to some questions.  No overnight fever or vomiting reported.   Objective: Vitals:   01/04/20 0431 01/04/20 0500 01/04/20 0600 01/04/20 0700  BP:  (!) 114/52 (!) 126/56 (!) 124/57  Pulse:  71 83 80  Resp:  17 18 18   Temp:      TempSrc:      SpO2:  97% 97% 97%  Weight: 86.3 kg     Height:        Intake/Output Summary (Last 24 hours) at 01/04/2020 0743 Last data filed at 01/04/2020 0600 Gross per 24 hour  Intake 2613.66 ml  Output 1643 ml  Net 970.66 ml   Filed Weights   01/02/20 0424 01/03/20 0338 01/04/20 0431  Weight: 83.5 kg 81.5 kg 86.3 kg    Examination:  General exam: Poor historian.  Looks chronically ill.  No distress.  Sleepy, wakes up slightly, nods her head slightly to some questions. Neck: On ventilator via tracheostomy Respiratory system: Bilateral decreased breath sounds at  bases with scattered crackles  cardiovascular system: S1-S2 heard, rate controlled Gastrointestinal system: Abdomen is nondistended, soft and nontender. Normal bowel sounds heard.  PEG tube present Extremities: No cyanosis; bilateral lower extremity trace edema Central nervous system: Awake and nods her head to some questions.  Quadriplegic.   Lymph: No cervical lymphadenopathy Psychiatry: Flat affect    Data Reviewed: I have personally reviewed following labs and imaging studies  CBC: Recent Labs  Lab 12/29/19 0405 12/29/19 0405 12/30/19 0458 12/31/19 0426 12/31/19 1037 01/02/20 0708 01/03/20 0531  WBC 1.8*  --  2.8* 2.9*  --  4.2 4.3  NEUTROABS  --   --   --   --   --  1.8 2.6  HGB 7.4*   < > 7.5* 7.6* 8.5* 7.7* 7.7*  HCT 24.2*   < > 26.5* 26.7* 25.0* 26.9* 26.4*  MCV 96.8  --  100.8* 101.9*  --  100.7* 99.2  PLT 148*  --  155 164  --  196 199   < > = values in this interval not displayed.   Basic Metabolic Panel: Recent Labs  Lab 12/31/19 0426 12/31/19 0426 12/31/19 1037 12/31/19 1326 01/01/20 0524 01/02/20 0708 01/03/20 0531  NA 151*   < > 152* 149* 147* 149* 144  K 4.2   < > 4.4 4.2 4.1 4.4 4.6  CL 113*  --   --  112* 108 110 105  CO2 34*  --   --  33* 33* 33* 32  GLUCOSE 122*  --   --  184* 149* 146* 105*  BUN 104*  --   --  106* 101* 98* 98*  CREATININE 1.02*  --   --  0.91 0.85 0.81 0.73  CALCIUM 9.3  --   --  9.3 9.3 9.1 9.0  MG  --   --   --   --   --  2.1 2.0   < > = values in this interval not displayed.   GFR: Estimated Creatinine Clearance: 83.7 mL/min (by C-G formula based on SCr of 0.73 mg/dL). Liver Function Tests: Recent Labs  Lab 12/30/19 0458 12/31/19 0426 01/01/20 0524 01/03/20 0531  AST 17 16 16 16   ALT 24 25 22 19   ALKPHOS 183* 184*  170* 169*  BILITOT 1.0 0.6 0.6 0.5  PROT 7.0 7.0 7.2 6.9  ALBUMIN 1.3* 1.2* 1.3* 1.3*   No results for input(s): LIPASE, AMYLASE in the last 168 hours. No results for input(s): AMMONIA in the last  168 hours. Coagulation Profile: No results for input(s): INR, PROTIME in the last 168 hours. Cardiac Enzymes: No results for input(s): CKTOTAL, CKMB, CKMBINDEX, TROPONINI in the last 168 hours. BNP (last 3 results) No results for input(s): PROBNP in the last 8760 hours. HbA1C: No results for input(s): HGBA1C in the last 72 hours. CBG: Recent Labs  Lab 01/03/20 1131 01/03/20 1549 01/03/20 1944 01/03/20 2351 01/04/20 0342  GLUCAP 118* 130* 138* 86 95   Lipid Profile: No results for input(s): CHOL, HDL, LDLCALC, TRIG, CHOLHDL, LDLDIRECT in the last 72 hours. Thyroid Function Tests: No results for input(s): TSH, T4TOTAL, FREET4, T3FREE, THYROIDAB in the last 72 hours. Anemia Panel: No results for input(s): VITAMINB12, FOLATE, FERRITIN, TIBC, IRON, RETICCTPCT in the last 72 hours. Sepsis Labs: No results for input(s): PROCALCITON, LATICACIDVEN in the last 168 hours.  Recent Results (from the past 240 hour(s))  Culture, blood (routine x 2)     Status: Abnormal   Collection Time: 12/25/19 10:26 AM   Specimen: BLOOD RIGHT HAND  Result Value Ref Range Status   Specimen Description BLOOD RIGHT HAND  Final   Special Requests   Final    BOTTLES DRAWN AEROBIC ONLY Blood Culture adequate volume   Culture  Setup Time   Final    AEROBIC BOTTLE ONLY YEAST CRITICAL RESULT CALLED TO, READ BACK BY AND VERIFIED WITH: Carmelia Bake [facility] 12/26/19 2340 JDW Performed at [facility], [address] [place], [place] [address]    Culture CANDIDA PARAPSILOSIS (A)  Final   Report Status 12/28/2019 FINAL  Final  Blood Culture ID Panel (Reflexed)     Status: Abnormal   Collection Time: 12/25/19 10:26 AM  Result Value Ref Range Status   Enterococcus species NOT DETECTED NOT DETECTED Final   Listeria monocytogenes NOT DETECTED NOT DETECTED Final   Staphylococcus species NOT DETECTED NOT DETECTED Final   Staphylococcus aureus (BCID) NOT DETECTED NOT DETECTED Final   Streptococcus species NOT  DETECTED NOT DETECTED Final   Streptococcus agalactiae NOT DETECTED NOT DETECTED Final   Streptococcus pneumoniae NOT DETECTED NOT DETECTED Final   Streptococcus pyogenes NOT DETECTED NOT DETECTED Final   Acinetobacter baumannii NOT DETECTED NOT DETECTED Final   Enterobacteriaceae species NOT DETECTED NOT DETECTED Final   Enterobacter cloacae complex NOT DETECTED NOT DETECTED Final   Escherichia coli NOT DETECTED NOT DETECTED Final   Klebsiella oxytoca NOT DETECTED NOT DETECTED Final   Klebsiella pneumoniae NOT DETECTED NOT DETECTED Final   Proteus species NOT DETECTED NOT DETECTED Final   Serratia marcescens NOT DETECTED NOT DETECTED Final   Haemophilus influenzae NOT DETECTED NOT DETECTED Final   Neisseria meningitidis NOT DETECTED NOT DETECTED Final   Pseudomonas aeruginosa NOT DETECTED NOT DETECTED Final   Candida albicans NOT DETECTED NOT DETECTED Final   Candida glabrata NOT DETECTED NOT DETECTED Final   Candida krusei NOT DETECTED NOT DETECTED Final   Candida parapsilosis DETECTED (A) NOT DETECTED Final    Comment: CRITICAL RESULT CALLED TO, READ BACK BY AND VERIFIED WITH: K HAMMONS PHARMD 12/26/19 2340 JDW    Candida tropicalis NOT DETECTED NOT DETECTED Final    Comment: Performed at [facility], [place] [address], [place], [place] [address]  Culture, blood (routine x  2)     Status: Abnormal   Collection Time: 12/25/19 10:34 AM   Specimen: BLOOD RIGHT HAND  Result Value Ref Range Status   Specimen Description BLOOD RIGHT HAND  Final   Special Requests   Final    BOTTLES DRAWN AEROBIC ONLY Blood Culture adequate volume   Culture  Setup Time   Final    AEROBIC BOTTLE ONLY YEAST CRITICAL RESULT CALLED TO, READ BACK BY AND VERIFIED WITH: K HAMMONS PHARMD 12/26/19 2340 JDW    Culture (A)  Final    CANDIDA PARAPSILOSIS STAPHYLOCOCCUS AUREUS SUSCEPTIBILITIES PERFORMED ON PREVIOUS CULTURE WITHIN THE LAST 5 DAYS. Performed at [facility], [place] [address],  [place], [place] [address]    Report Status 12/29/2019 FINAL  Final  Culture, blood (routine x 2)     Status: None   Collection Time: 12/27/19  9:20 AM   Specimen: BLOOD RIGHT ARM  Result Value Ref Range Status   Specimen Description BLOOD RIGHT ARM  Final   Special Requests   Final    BOTTLES DRAWN AEROBIC AND ANAEROBIC Blood Culture results may not be optimal due to an inadequate volume of blood received in culture bottles   Culture   Final    NO GROWTH 5 DAYS Performed at [facility], [place] [address], [place], [place] [address]    Report Status 01/01/2020 FINAL  Final  Culture, blood (routine x 2)     Status: None   Collection Time: 12/27/19  9:30 AM   Specimen: BLOOD LEFT HAND  Result Value Ref Range Status   Specimen Description BLOOD LEFT HAND  Final   Special Requests   Final    BOTTLES DRAWN AEROBIC ONLY Blood Culture adequate volume   Culture   Final    NO GROWTH 5 DAYS Performed at [facility], [place] [address], [place], [place] [address]    Report Status 01/01/2020 FINAL  Final  SARS CORONAVIRUS 2 (TAT 6-24 HRS) Nasopharyngeal Nasopharyngeal Swab     Status: None   Collection Time: 12/31/19  1:40 PM   Specimen: Nasopharyngeal Swab  Result Value Ref Range Status   SARS Coronavirus 2 NEGATIVE NEGATIVE Final    Comment: (NOTE) SARS-CoV-2 target nucleic acids are NOT DETECTED. The SARS-CoV-2 RNA is generally detectable in upper and lower respiratory specimens during the acute phase of infection. Negative results do not preclude SARS-CoV-2 infection, do not rule out co-infections with other pathogens, and should not be used as the sole basis for treatment or other patient management decisions. Negative results must be combined with clinical observations, patient history, and epidemiological information. The expected result is Negative. Fact Sheet for Patients: SugarRoll.be Fact Sheet for Healthcare  Providers: https://www.woods-mathews.com/ This test is not yet approved or cleared by the [place] FDA and  has been authorized for detection and/or diagnosis of SARS-CoV-2 by FDA under an Emergency Use Authorization (EUA). This EUA will remain  in effect (meaning this test can be used) for the duration of the COVID-19 declaration under Section 56 4(b)(1) of the Act, 21 U.S.C. section 360bbb-3(b)(1), unless the authorization is terminated or revoked sooner. Performed at [facility], [place] [address], [place], [place] [address]          Radiology Studies: No results found.      Scheduled Meds: . sodium chloride   Intravenous Once  . chlorhexidine gluconate (MEDLINE KIT)  15 mL Mouth Rinse BID  . Chlorhexidine Gluconate Cloth  6  each Topical Daily  . feeding supplement (PRO-STAT SUGAR FREE 64)  30 mL Per Tube TID  . ferrous sulfate  300 mg Per Tube Q breakfast  . free water  300 mL Per Tube Q4H  . Gerhardt's butt cream  1 application Topical QID  . insulin aspart  0-15 Units Subcutaneous Q4H  . insulin aspart  5 Units Subcutaneous Q4H  . insulin glargine  6 Units Subcutaneous Daily  . ipratropium-albuterol  3 mL Nebulization TID  . levothyroxine  25 mcg Oral Q0600  . mouth rinse  15 mL Mouth Rinse 10 times per day  . nutrition supplement (JUVEN)  1 packet Per Tube BID  . nystatin  5 mL Oral TID AC & HS  . pantoprazole sodium  40 mg Per Tube Q1200  . rifampin  600 mg Per Tube q1800  . sodium chloride flush  10-40 mL Intracatheter Q12H   Continuous Infusions: . sodium chloride Stopped (01/02/20 0755)  . feeding supplement (JEVITY 1.5 CAL/FIBER) 1,000 mL (01/03/20 1527)  . fluconazole (DIFLUCAN) IV Stopped (01/03/20 1024)  . vancomycin Stopped (01/03/20 0725)          Aline August, MD [facility] 01/04/2020, 7:43 AM

## 2020-01-05 ENCOUNTER — Inpatient Hospital Stay (HOSPITAL_COMMUNITY): Payer: Medicare PPO

## 2020-01-05 ENCOUNTER — Other Ambulatory Visit (HOSPITAL_COMMUNITY): Payer: Medicare PPO

## 2020-01-05 DIAGNOSIS — Z515 Encounter for palliative care: Secondary | ICD-10-CM

## 2020-01-05 DIAGNOSIS — G8250 Quadriplegia, unspecified: Secondary | ICD-10-CM

## 2020-01-05 DIAGNOSIS — M7989 Other specified soft tissue disorders: Secondary | ICD-10-CM

## 2020-01-05 DIAGNOSIS — R7881 Bacteremia: Secondary | ICD-10-CM

## 2020-01-05 LAB — BASIC METABOLIC PANEL
Anion gap: 6 (ref 5–15)
BUN: 97 mg/dL — ABNORMAL HIGH (ref 8–23)
CO2: 32 mmol/L (ref 22–32)
Calcium: 8.7 mg/dL — ABNORMAL LOW (ref 8.9–10.3)
Chloride: 103 mmol/L (ref 98–111)
Creatinine, Ser: 0.83 mg/dL (ref 0.44–1.00)
GFR calc Af Amer: >60 mL/min (ref >60)
GFR calc non Af Amer: >60 mL/min (ref >60)
Glucose, Bld: 133 mg/dL — ABNORMAL HIGH (ref 70–99)
Potassium: 4.7 mmol/L (ref 3.5–5.1)
Sodium: 141 mmol/L (ref 135–145)

## 2020-01-05 LAB — GLUCOSE, CAPILLARY
Glucose-Capillary: 110 mg/dL — ABNORMAL HIGH (ref 70–99)
Glucose-Capillary: 112 mg/dL — ABNORMAL HIGH (ref 70–99)
Glucose-Capillary: 127 mg/dL — ABNORMAL HIGH (ref 70–99)
Glucose-Capillary: 131 mg/dL — ABNORMAL HIGH (ref 70–99)
Glucose-Capillary: 77 mg/dL (ref 70–99)

## 2020-01-05 LAB — MAGNESIUM: Magnesium: 2.2 mg/dL (ref 1.7–2.4)

## 2020-01-05 LAB — HEPATIC FUNCTION PANEL
ALT: 21 U/L (ref 0–44)
AST: 17 U/L (ref 15–41)
Albumin: 1.3 g/dL — ABNORMAL LOW (ref 3.5–5.0)
Alkaline Phosphatase: 188 U/L — ABNORMAL HIGH (ref 38–126)
Bilirubin, Direct: 0.1 mg/dL (ref 0.0–0.2)
Indirect Bilirubin: 0.6 mg/dL (ref 0.3–0.9)
Total Bilirubin: 0.7 mg/dL (ref 0.3–1.2)
Total Protein: 7 g/dL (ref 6.5–8.1)

## 2020-01-05 LAB — CBC WITH DIFFERENTIAL/PLATELET
Abs Immature Granulocytes: 0.66 10*3/uL — ABNORMAL HIGH (ref 0.00–0.07)
Basophils Absolute: 0.1 10*3/uL (ref 0.0–0.1)
Basophils Relative: 1 %
Eosinophils Absolute: 0.5 10*3/uL (ref 0.0–0.5)
Eosinophils Relative: 7 %
HCT: 24.4 % — ABNORMAL LOW (ref 36.0–46.0)
Hemoglobin: 7.3 g/dL — ABNORMAL LOW (ref 12.0–15.0)
Immature Granulocytes: 10 %
Lymphocytes Relative: 22 %
Lymphs Abs: 1.5 10*3/uL (ref 0.7–4.0)
MCH: 29.1 pg (ref 26.0–34.0)
MCHC: 29.9 g/dL — ABNORMAL LOW (ref 30.0–36.0)
MCV: 97.2 fL (ref 80.0–100.0)
Monocytes Absolute: 0.4 10*3/uL (ref 0.1–1.0)
Monocytes Relative: 6 %
Neutro Abs: 3.6 10*3/uL (ref 1.7–7.7)
Neutrophils Relative %: 54 %
Platelets: 238 10*3/uL (ref 150–400)
RBC: 2.51 MIL/uL — ABNORMAL LOW (ref 3.87–5.11)
RDW: 18.3 % — ABNORMAL HIGH (ref 11.5–15.5)
WBC: 6.7 10*3/uL (ref 4.0–10.5)
nRBC: 0.3 % — ABNORMAL HIGH (ref 0.0–0.2)

## 2020-01-05 MED ORDER — APIXABAN 5 MG PO TABS: 5.0000 mg | ORAL_TABLET | Freq: Two times a day (BID) | ORAL | Status: DC

## 2020-01-05 MED ORDER — TRIAMCINOLONE 0.1 % CREAM:EUCERIN CREAM 1:1
TOPICAL_CREAM | Freq: Two times a day (BID) | CUTANEOUS | Status: DC
  Filled 2020-01-05: qty 1

## 2020-01-05 MED ORDER — APIXABAN 5 MG PO TABS
10.0000 mg | ORAL_TABLET | Freq: Two times a day (BID) | ORAL | Status: DC
  Administered 2020-01-05 – 2020-01-07 (×5): 10 mg via ORAL
  Filled 2020-01-05 (×5): qty 2

## 2020-01-05 NOTE — Progress Notes (Signed)
VASCULAR LAB PRELIMINARY  PRELIMINARY  PRELIMINARY  PRELIMINARY  Bilateral upper extremity venous duplex completed.    Preliminary report:  See CV proc for preliminary results.  Gave Dr. Hanley Ben report  Rosezetta Schlatter, Brigitt Mcclish, RVT 01/05/2020, 3:08 PM

## 2020-01-05 NOTE — Progress Notes (Signed)
ANTICOAGULATION CONSULT NOTE  Pharmacy Consult for apixaban Indication: DVT  Labs: Recent Labs    01/03/20 0531 01/05/20 0301  HGB 7.7* 7.3*  HCT 26.4* 24.4*  PLT 199 238  CREATININE 0.73 0.83    Assessment: 63 yof continuing on vancomycin, rifampin, and fluconazole for polymicrobial bacteremia and severe MSSA C-spine infection. Vancomycin/rifampin to continue through 2/14 and fluconazole through 1/22. Patient now found to have acute DVT. Pharmacy consulted to start apixaban. SCr stable <1, Hg 7.3 stable, plt wnl. No active bleed issues documented. Noted major interaction between rifampin and apixaban.  Goal of Therapy:  VTE treatment Monitor platelets by anticoagulation protocol: Yes   Plan:  D/c rifampin per discussion with Dr. Daiva Eves (ID) due to major interaction with apixaban Apixaban 10mg  PO BID x 7 days, then 5mg  PO BID Monitor CBC, s/sx bleeding   , PharmD, BCPS Clinical Pharmacist 01/05/2020 2:35 PM

## 2020-01-05 NOTE — Progress Notes (Signed)
Patient ID: Gabrielle Wade, female   DOB: 10/20/1956, 63 y.o.   MRN: [id number]  PROGRESS NOTE    Gabrielle Wade  MRN:[id number] DOB: 10/15/1956 DOA: 12/23/2019 PCP: Van Eyk, Jason, MD   Brief Narrative:  63-year-old female with history of anxiety/depression, PTSD, MRSA epidural abscess, quadriplegia, diabetes mellitus type 2, chronic kidney disease stage III, COPD, chronic hypoxic respiratory failure on chronic trach/vent dependence, chronic dysphagia status post PEG tube, GERD, anemia presented with hemoglobin of 6.8 and WBC of 0.6 per [facility].  On presentation, chest x-ray showed bilateral airspace opacities.  She was found to have MRSA, ESBL E. coli bacteremia along with candidemia.  ID and oncology were consulted.  She was treated with meropenem, and vancomycin and Diflucan.  PCCM was consulted as well for trach/vent management.  Echo showed EF of 50 to 50% with grade 2 diastolic dysfunction.  She was transfused 1 unit of packed red cells on 12/28/2019.  Subsequently, she has remained stable and is waiting to be discharged back to SNF with vent facility.  Currently medically stable for discharge.  Assessment & Plan:   Sepsis: Present on admission -Resolved.  Currently hemodynamically stable.  Polymicrobial bloodstream infection including MRSA, ESBL E. coli bacteremia and candidemia with Candida parapsilosis -Source was probably right arm PICC line which was removed after admission.  Left heel x-ray did not show evidence of osteomyelitis.  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  Right upper extremity duplex was negative for DVT. -Has completed treatment for E. coli bacteremia with meropenem. -Currently on Diflucan and vancomycin.  ID has recommended vancomycin till 02/02/2020 along with oral rifampin to complete 6 weeks of therapy.  Continue Diflucan till 01/10/2020.  Repeat blood cultures from 12/27/2019  have been negative so far.  Neutropenia/anemia -Likely due to sepsis from bacteremia. -Hematology evaluated the patient during the hospitalization. -Status post 1 unit of packed red cell transfusion on 12/28/2019. -Subsequently white blood cell count has normalized. -Transfuse packed red cell if hemoglobin is less than 7. -Outpatient follow-up with hematology  Hypernatremia -Improving.  Continue free water.  Monitor  Acute on chronic hypoxic respiratory failure/chronic vent dependence on tracheostomy -PCCM managing tracheostomy on ventilator.  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  Acute kidney injury -Resolved  Diabetes mellitus type 2 -Continue long-acting and short-acting insulin with CBGs with SSI  Hypoalbuminemia -Continue tube feeding as per dietary recommendations.  Generalized deconditioning -Overall prognosis is very poor.  Palliative care consultation requested. -Patient is currently medically stable for discharge to SNF with plan facility.  Care management/social worker working on the same.   DVT prophylaxis: Not on heparin/Lovenox because of anemia Code Status: Full Family Communication: None at bedside  disposition Plan: SNF with vent capacity once bed is available  Consultants: PCCM/ID/hematology/IR  Procedures: None  Antimicrobials:  Anti-infectives (From admission, onward)   Start     Dose/Rate Route Frequency Ordered Stop   01/01/20 0700  vancomycin (VANCOCIN) IVPB 1000 mg/200 mL premix     1,000 mg 200 mL/hr over 60 Minutes Intravenous Every 48 hours 12/30/19 0819     12/31/19 0000  vancomycin IVPB     1,000 mg Intravenous Every 48 hours 12/31/19 1244 02/03/20 2359   12/31/19 0000  fluconazole (DIFLUCAN) IVPB     400 mg Intravenous Every 24 hours 12/31/19 1244 01/11/20 2359   12/31/19 0000  rifampin (  RIFADIN) 60 mg/mL SUSP oral suspension     600 mg Per Tube Daily-1800 12/31/19  1244 02/03/20 2359   12/28/19 0800  fluconazole (DIFLUCAN) IVPB 200 mg  Status:  Discontinued     200 mg 100 mL/hr over 60 Minutes Intravenous Every 24 hours 12/27/19 0012 12/27/19 0724   12/28/19 0800  fluconazole (DIFLUCAN) IVPB 400 mg     400 mg 100 mL/hr over 120 Minutes Intravenous Every 24 hours 12/27/19 0724     12/27/19 0609  vancomycin (VANCOCIN) IVPB 1000 mg/200 mL premix  Status:  Discontinued     1,000 mg 200 mL/hr over 60 Minutes Intravenous Every 24 hours 12/26/19 0742 12/30/19 0740   12/27/19 0230  fluconazole (DIFLUCAN) IVPB 400 mg     400 mg 100 mL/hr over 120 Minutes Intravenous Every 1 hr x 2 12/27/19 0222 12/27/19 0714   12/27/19 0100  fluconazole (DIFLUCAN) IVPB 800 mg  Status:  Discontinued     800 mg 200 mL/hr over 120 Minutes Intravenous  Once 12/27/19 0012 12/27/19 0222   12/26/19 2200  meropenem (MERREM) 1 g in sodium chloride 0.9 % 100 mL IVPB     1 g 200 mL/hr over 30 Minutes Intravenous Every 12 hours 12/26/19 1337 12/30/19 2333   12/26/19 1800  rifampin (RIFADIN) 60 mg/mL oral suspension 600 mg     600 mg Per Tube Daily-1800 12/25/19 2207     12/26/19 1000  fluconazole (DIFLUCAN) tablet 200 mg  Status:  Discontinued     200 mg Per Tube Daily 12/26/19 0514 12/27/19 0012   12/26/19 0600  vancomycin (VANCOCIN) IVPB 1000 mg/200 mL premix  Status:  Discontinued     1,000 mg 200 mL/hr over 60 Minutes Intravenous Every 48 hours 12/24/19 0512 12/26/19 0742   12/25/19 1900  meropenem (MERREM) 1 g in sodium chloride 0.9 % 100 mL IVPB  Status:  Discontinued     1 g 200 mL/hr over 30 Minutes Intravenous Every 8 hours 12/25/19 1838 12/26/19 1337   12/25/19 1630  rifampin (RIFADIN) 60 mg/mL oral suspension 600 mg  Status:  Discontinued     600 mg Per Tube Daily 12/25/19 1553 12/25/19 2207   12/24/19 2250  meropenem (MERREM) 1 g in sodium chloride 0.9 % 100 mL IVPB  Status:  Discontinued     1 g 200 mL/hr over 30 Minutes Intravenous Every 8 hours 12/24/19 1942 12/25/19  1838   12/24/19 1815  fluconazole (DIFLUCAN) 40 MG/ML suspension 200 mg  Status:  Discontinued     200 mg Per Tube Daily 12/24/19 1804 12/26/19 0514   12/24/19 1445  fluconazole (DIFLUCAN) 40 MG/ML suspension 200 mg  Status:  Discontinued     200 mg Per Tube Daily 12/24/19 1436 12/24/19 1804   12/24/19 1000  ceFEPIme (MAXIPIME) 2 g in sodium chloride 0.9 % 100 mL IVPB  Status:  Discontinued     2 g 200 mL/hr over 30 Minutes Intravenous Every 12 hours 12/24/19 0512 12/24/19 0859   12/24/19 1000  meropenem (MERREM) 1 g in sodium chloride 0.9 % 100 mL IVPB  Status:  Discontinued     1 g 200 mL/hr over 30 Minutes Intravenous Every 12 hours 12/24/19 0859 12/24/19 1942   12/24/19 0315  vancomycin (VANCOREADY) IVPB 1250 mg/250 mL     1,250 mg 166.7 mL/hr over 90 Minutes Intravenous  Once 12/24/19 0250 12/24/19 0819   12/23/19 2045  ceFEPIme (MAXIPIME) 2 g in sodium chloride 0.9 % 100 mL IVPB       2 g 200 mL/hr over 30 Minutes Intravenous  Once 12/23/19 2030 12/23/19 2201       Subjective: Patient seen and examined at bedside.  Sleepy, wakes up slightly on calling her name, nods to some questions.  Poor historian.  No overnight fever or vomiting reported.  Objective: Vitals:   01/05/20 0400 01/05/20 0446 01/05/20 0500 01/05/20 0600  BP: 136/64  135/61 (!) 134/57  Pulse: 76  79 73  Resp: 15  16 15  Temp:  97.9 F (36.6 C)    TempSrc:  Oral    SpO2: 98%  97% 98%  Weight:      Height:        Intake/Output Summary (Last 24 hours) at 01/05/2020 0733 Last data filed at 01/05/2020 0600 Gross per 24 hour  Intake 2610 ml  Output 1850 ml  Net 760 ml   Filed Weights   01/03/20 0338 01/04/20 0431 01/05/20 0114  Weight: 81.5 kg 86.3 kg 78.9 kg    Examination:  General exam: Poor historian.  Looks chronically ill.  No acute distress.   Neck: On vent via tracheostomy Respiratory system: Bilateral decreased breath sounds at bases with some crackles.  No wheezing  cardiovascular system:  Rate controlled, S1-S2 heard Gastrointestinal system: Abdomen is nondistended, soft and nontender.  Bowel sounds heard.  PEG tube present Extremities: No cyanosis or clubbing; bilateral lower extremity trace edema Central nervous system: Sleepy, wakes up slightly and nods her head to some questions.  Quadriplegic.   Lymph: No lymphadenopathy in cervical region Psychiatry: Affect is flat    Data Reviewed: I have personally reviewed following labs and imaging studies  CBC: Recent Labs  Lab 12/30/19 0458 12/30/19 0458 12/31/19 0426 12/31/19 1037 01/02/20 0708 01/03/20 0531 01/05/20 0301  WBC 2.8*  --  2.9*  --  4.2 4.3 6.7  NEUTROABS  --   --   --   --  1.8 2.6 3.6  HGB 7.5*   < > 7.6* 8.5* 7.7* 7.7* 7.3*  HCT 26.5*   < > 26.7* 25.0* 26.9* 26.4* 24.4*  MCV 100.8*  --  101.9*  --  100.7* 99.2 97.2  PLT 155  --  164  --  196 199 238   < > = values in this interval not displayed.   Basic Metabolic Panel: Recent Labs  Lab 12/31/19 1326 01/01/20 0524 01/02/20 0708 01/03/20 0531 01/05/20 0301  NA 149* 147* 149* 144 141  K 4.2 4.1 4.4 4.6 4.7  CL 112* 108 110 105 103  CO2 33* 33* 33* 32 32  GLUCOSE 184* 149* 146* 105* 133*  BUN 106* 101* 98* 98* 97*  CREATININE 0.91 0.85 0.81 0.73 0.83  CALCIUM 9.3 9.3 9.1 9.0 8.7*  MG  --   --  2.1 2.0 2.2   GFR: Estimated Creatinine Clearance: 73.1 mL/min (by C-G formula based on SCr of 0.83 mg/dL). Liver Function Tests: Recent Labs  Lab 12/30/19 0458 12/31/19 0426 01/01/20 0524 01/03/20 0531 01/05/20 0301  AST 17 16 16 16 17  ALT 24 25 22 19 21  ALKPHOS 183* 184* 170* 169* 188*  BILITOT 1.0 0.6 0.6 0.5 0.7  PROT 7.0 7.0 7.2 6.9 7.0  ALBUMIN 1.3* 1.2* 1.3* 1.3* 1.3*   No results for input(s): LIPASE, AMYLASE in the last 168 hours. No results for input(s): AMMONIA in the last 168 hours. Coagulation Profile: No results for input(s): INR, PROTIME in the last 168 hours. Cardiac Enzymes: No results for input(s): CKTOTAL, CKMB,    CKMBINDEX, TROPONINI in the last 168 hours. BNP (last 3 results) No results for input(s): PROBNP in the last 8760 hours. HbA1C: No results for input(s): HGBA1C in the last 72 hours. CBG: Recent Labs  Lab 01/04/20 1136 01/04/20 1624 01/04/20 2017 01/05/20 0018 01/05/20 0444  GLUCAP 126* 106* 116* 131* 112*   Lipid Profile: No results for input(s): CHOL, HDL, LDLCALC, TRIG, CHOLHDL, LDLDIRECT in the last 72 hours. Thyroid Function Tests: No results for input(s): TSH, T4TOTAL, FREET4, T3FREE, THYROIDAB in the last 72 hours. Anemia Panel: No results for input(s): VITAMINB12, FOLATE, FERRITIN, TIBC, IRON, RETICCTPCT in the last 72 hours. Sepsis Labs: No results for input(s): PROCALCITON, LATICACIDVEN in the last 168 hours.  Recent Results (from the past 240 hour(s))  Culture, blood (routine x 2)     Status: None   Collection Time: 12/27/19  9:20 AM   Specimen: BLOOD RIGHT ARM  Result Value Ref Range Status   Specimen Description BLOOD RIGHT ARM  Final   Special Requests   Final    BOTTLES DRAWN AEROBIC AND ANAEROBIC Blood Culture results may not be optimal due to an inadequate volume of blood received in culture bottles   Culture   Final    NO GROWTH 5 DAYS Performed at [facility], [address] [place], [place] [address]    Report Status 01/01/2020 FINAL  Final  Culture, blood (routine x 2)     Status: None   Collection Time: 12/27/19  9:30 AM   Specimen: BLOOD LEFT HAND  Result Value Ref Range Status   Specimen Description BLOOD LEFT HAND  Final   Special Requests   Final    BOTTLES DRAWN AEROBIC ONLY Blood Culture adequate volume   Culture   Final    NO GROWTH 5 DAYS Performed at [facility], [address] [place], [place] [address]    Report Status 01/01/2020 FINAL  Final  SARS CORONAVIRUS 2 (TAT 6-24 HRS) Nasopharyngeal Nasopharyngeal Swab     Status: None   Collection Time: 12/31/19  1:40 PM   Specimen: Nasopharyngeal Swab  Result Value Ref  Range Status   SARS Coronavirus 2 NEGATIVE NEGATIVE Final    Comment: (NOTE) SARS-CoV-2 target nucleic acids are NOT DETECTED. The SARS-CoV-2 RNA is generally detectable in upper and lower respiratory specimens during the acute phase of infection. Negative results do not preclude SARS-CoV-2 infection, do not rule out co-infections with other pathogens, and should not be used as the sole basis for treatment or other patient management decisions. Negative results must be combined with clinical observations, patient history, and epidemiological information. The expected result is Negative. Fact Sheet for Patients: https://www.fda.gov/media/138098/download Fact Sheet for Healthcare Providers: https://www.fda.gov/media/138095/download This test is not yet approved or cleared by the [place] FDA and  has been authorized for detection and/or diagnosis of SARS-CoV-2 by FDA under an Emergency Use Authorization (EUA). This EUA will remain  in effect (meaning this test can be used) for the duration of the COVID-19 declaration under Section 56 4(b)(1) of the Act, 21 U.S.C. section 360bbb-3(b)(1), unless the authorization is terminated or revoked sooner. Performed at [facility], [address] [place], [place] [address]          Radiology Studies: No results found.      Scheduled Meds: . sodium chloride   Intravenous Once  . chlorhexidine gluconate (MEDLINE KIT)  15 mL Mouth Rinse BID  . Chlorhexidine Gluconate Cloth  6 each Topical Daily  .   feeding supplement (PRO-STAT SUGAR FREE 64)  30 mL Per Tube TID  . ferrous sulfate  300 mg Per Tube Q breakfast  . free water  300 mL Per Tube Q4H  . Gerhardt's butt cream  1 application Topical QID  . insulin aspart  0-15 Units Subcutaneous Q4H  . insulin aspart  5 Units Subcutaneous Q4H  . insulin glargine  6 Units Subcutaneous Daily  . ipratropium-albuterol  3 mL Nebulization TID  . levothyroxine  25 mcg Oral Q0600  . mouth  rinse  15 mL Mouth Rinse 10 times per day  . nutrition supplement (JUVEN)  1 packet Per Tube BID  . nystatin  5 mL Oral TID AC & HS  . pantoprazole sodium  40 mg Per Tube Q1200  . rifampin  600 mg Per Tube q1800  . sodium chloride flush  10-40 mL Intracatheter Q12H   Continuous Infusions: . sodium chloride Stopped (01/02/20 0755)  . feeding supplement (JEVITY 1.5 CAL/FIBER) 1,000 mL (01/04/20 1629)  . fluconazole (DIFLUCAN) IV 400 mg (01/04/20 0833)  . vancomycin 1,000 mg (01/05/20 0632)          Kshitiz Alekh, MD [facility] 01/05/2020, 7:33 AM   

## 2020-01-05 NOTE — Consult Note (Signed)
Consultation Note Date: 01/05/2020   Patient Name: Gabrielle Wade  DOB: 05/05/1956  MRN: [id number]  Age / Sex: 64 y.o., female  PCP: Gabrielle Roger, MD Referring Physician: Aline August, MD  Reason for Consultation: Establishing goals of care and Psychosocial/spiritual support  HPI/Patient Profile: 64 y.o. female  with past medical history of  DM, CKD 3, COPD, PTSD, quadriplegia with trach dependence and PEG tube with who was admitted on 12/23/2019 with MRSA, ESBL E coli bacteremia with neutropenia.  The patient has been treated and is stable for discharge.  We have been consulted for [place].  Clinical Assessment and Goals of Care:  I have reviewed medical records including EPIC notes, labs and imaging, received report from Dr. Starla Wade, examined the patient, attempted to call her husband (no answer and voice mail not set up) but spoke on the phone with her daughter to discuss diagnosis prognosis, [place], EOL wishes, disposition and options.  I introduced Palliative Medicine as specialized medical care for people living with serious illness. It focuses on providing relief from the symptoms and stress of a serious illness.   We discussed a brief life review of the patient.  She was a Gabrielle Wade for over 20 years.  She raised her daughter Gabrielle Wade and eventually adopted Gabrielle Wade as her own.  The patient's Wade is now 64 years old.  She has been married to her current husband since the late 62s.  Gabrielle Wade states her mother had an abscess on her spine that was causing her problems with balance and walking.  She underwent surgery for the abscess in Wade or September of 2020 and has not been able to walk since.  We discussed her current illness and what it means in the larger context of her on-going co-morbidities.  Natural disease trajectory and expectations at EOL were discussed.   I explained that her mother  would not walk or be able to use her limbs again.  Gabrielle Wade had been told this before.  Her voice became sad and she told me that her step father was waiting for a "miracle".  I did not give further detail about her mother's health.  I attempted to elicit values and goals of care important to the patient.  Gabrielle Wade expressed that her mother had previously spoken with her Gabrielle Wade and at that time did not want further aggressive care.  Gabrielle Wade felt that her step father was very stubborn and had insisted that the patient continue on with aggressive care.  Gabrielle Wade explained that her step father had tried to obtain power of attorney but the patient would not give it to him.  This was curious to me as in [place] the husband would already be POA.   Gabrielle Wade encouraged me to speak directly with her mother about her wishes for continued aggressive medical care.  Questions and concerns were addressed.  The family was encouraged to call with questions or concerns.   Primary Decision Maker:  PATIENT    SUMMARY OF RECOMMENDATIONS    PMT  will meet and attempt to converse with patient tomorrow.  Perhaps the RN or SLP will help read the patient's lips and witness our conversation.  Code Status/Advance Care Planning:  Full Code  Symptom management  Eucerin cream and triamcinolone cream  Additional Recommendations (Limitations, Scope, Preferences):  Full Scope Treatment  Palliative Prophylaxis:   Frequent Pain Assessment, patient requiring frequent suction.  Psycho-social/Spiritual:   Desire for further Chaplaincy support:  Welcomed  Prognosis:  Poor prognosis patient is a quadriplegic with trach, PEG, difficulty with secretions, and multiple antibiotic resistant infections.  She likely has only months even with continued aggressive medical care.    Discharge Planning: LTAC with Palliative to follow      Primary Diagnoses: Present on Admission: . Anemia . Neutropenia ([place]) . Quadriplegia ([place]) . MRSA  bacteremia . E coli bacteremia . Pressure ulcer of left heel, stage 3 ([place]) . Edema of right upper arm . Lead-less pacemaker   I have reviewed the medical record, interviewed the patient and family, and examined the patient. The following aspects are pertinent.  Past Medical History:  Diagnosis Date  . Acute and chronic respiratory failure (acute-on-chronic) ([place])   . Acute posthemorrhagic anemia   . Anemia in other chronic diseases classified elsewhere   . Anxiety   . Anxiety   . Asthma   . Bradycardia   . Chronic kidney disease, stage 3   . Compartment syndrome, unspecified, sequela   . COPD (chronic obstructive pulmonary disease) ([place])   . Dependence on respirator ([place])   . Diabetes mellitus without complication ([place])   . Dysphagia, oropharyngeal   . GERD (gastroesophageal reflux disease)   . Hyperkalemia   . Hypotension   . Intraspinal abscess and granuloma   . MDD (major depressive disorder)   . MRSA (methicillin resistant Staphylococcus aureus)   . Presence of cardiac pacemaker   . Pressure ulcer of unspecified heel, unspecified stage   . PTSD (post-traumatic stress disorder)   . Quadriplegia [facility])    Social History   Socioeconomic History  . Marital status: Married    Spouse name: Not on file  . Number of children: Not on file  . Years of education: Not on file  . Highest education level: Not on file  Occupational History  . Not on file  Tobacco Use  . Smoking status: Former Research scientist (life sciences)  . Smokeless tobacco: Never Used  Substance and Sexual Activity  . Alcohol use: Not Currently  . Drug use: Not Currently  . Sexual activity: Not on file  Other Topics Concern  . Not on file  Social History Narrative  . Not on file   Social Determinants of Health   Financial Resource Strain:   . Difficulty of Paying Living Expenses: Not on file  Food Insecurity:   . Worried About Charity fundraiser in the Last Year: Not on file  . Ran Out of Food in the Last Year: Not  on file  Transportation Needs:   . Lack of Transportation (Medical): Not on file  . Lack of Transportation (Non-Medical): Not on file  Physical Activity:   . Days of Exercise per Week: Not on file  . Minutes of Exercise per Session: Not on file  Stress:   . Feeling of Stress : Not on file  Social Connections:   . Frequency of Communication with Friends and Family: Not on file  . Frequency of Social Gatherings with Friends and Family: Not on file  . Attends Religious Services: Not  on file  . Active Member of Clubs or Organizations: Not on file  . Attends Archivist Meetings: Not on file  . Marital Status: Not on file   Family History  Family history unknown: Yes   Scheduled Meds: . sodium chloride   Intravenous Once  . apixaban  10 mg Oral BID   Followed by  . [START ON 01/12/2020] apixaban  5 mg Oral BID  . chlorhexidine gluconate (MEDLINE KIT)  15 mL Mouth Rinse BID  . Chlorhexidine Gluconate Cloth  6 each Topical Daily  . feeding supplement (PRO-STAT SUGAR FREE 64)  30 mL Per Tube TID  . ferrous sulfate  300 mg Per Tube Q breakfast  . free water  300 mL Per Tube Q4H  . Gerhardt's butt cream  1 application Topical QID  . insulin aspart  0-15 Units Subcutaneous Q4H  . insulin aspart  5 Units Subcutaneous Q4H  . insulin glargine  6 Units Subcutaneous Daily  . ipratropium-albuterol  3 mL Nebulization TID  . levothyroxine  25 mcg Oral Q0600  . mouth rinse  15 mL Mouth Rinse 10 times per day  . nutrition supplement (JUVEN)  1 packet Per Tube BID  . pantoprazole sodium  40 mg Per Tube Q1200  . sodium chloride flush  10-40 mL Intracatheter Q12H   Continuous Infusions: . sodium chloride Stopped (01/02/20 0755)  . feeding supplement (JEVITY 1.5 CAL/FIBER) 1,000 mL (01/04/20 1629)  . fluconazole (DIFLUCAN) IV Stopped (01/05/20 1118)  . vancomycin Stopped (01/05/20 0732)   PRN Meds:.sodium chloride, acetaminophen **OR** acetaminophen, ipratropium-albuterol, LORazepam,  oxyCODONE, sodium chloride flush Allergies  Allergen Reactions  . Codeine   . Hydrocodone   . Influenza A (H1n1) Monovalent Vaccine   . Latex   . Morphine And Related   . Penicillins   . Shellfish Allergy   . Tape    Review of Systems unable to provide  Physical Exam  Well developed chronically ill female, awake, alert, asks for help as she is coughing secretions Trach in place CV RRR Resp coarse breath sounds bilaterally Extremities are swollen 2-3+  Skin scaley, red, appears irritated  Vital Signs: BP (!) 111/51   Pulse 78   Temp 97.8 F (36.6 C) (Axillary)   Resp 16   Ht 5' 10" (1.778 m)   Wt 78.9 kg   SpO2 96%   BMI 24.96 kg/m  Pain Scale: 0-10   Pain Score: 8    SpO2: SpO2: 96 % O2 Device:SpO2: 96 % O2 Flow Rate: .   IO: Intake/output summary:   Intake/Output Summary (Last 24 hours) at 01/05/2020 1832 Last data filed at 01/05/2020 1751 Gross per 24 hour  Intake 1499.86 ml  Output 1870 ml  Net -370.14 ml    LBM: Last BM Date: 01/04/20 Baseline Weight: Weight: 91.3 kg Most recent weight: Weight: 78.9 kg     Palliative Assessment/Data: 10%     Time In: 5:00 Time Out: 6:15 Time Total: 75 min Visit consisted of counseling and education dealing with the complex and emotionally intense issues surrounding the need for palliative care and symptom management in the setting of serious and potentially life-threatening illness. Greater than 50%  of this time was spent counseling and coordinating care related to the above assessment and plan.  Signed by: Florentina Jenny, PA-C Palliative Medicine Pager: [phone]  Please contact Palliative Medicine Team phone at [phone] for questions and concerns.  For individual provider: See Shea Evans

## 2020-01-05 NOTE — Discharge Instructions (Signed)
Catheter-Directed Thrombolysis Catheter-directed thrombolysis is a procedure to remove a blood clot (thrombus) from inside a blood vessel. A blood clot inside a blood vessel can block blood flow or break away and travel to another area of the body, which can cause severe problems such as heart attack or stroke. You may have this procedure to treat:  A clot blocking the blood flow in your arm or leg (limb ischemia).  A clot in a large vein (deep vein thrombosis).  A blockage in a dialysis graft.  A clot in the blood flow to your lungs (pulmonary embolism). Tell a health care provider about:  Any allergies you have.  All medicines you are taking, including vitamins, herbs, eye drops, creams, and over-the-counter medicines.  Any problems you or family members have had with anesthetic medicines.  Any blood disorders you have.  Any surgeries you have had.  Any medical conditions you have.  Whether you are pregnant or may be pregnant.  Whether you are breastfeeding.  Any recent injuries, such as a car accident or fall. What are the risks? Generally, this is a safe procedure. However, problems may occur, including:  Bleeding at the catheter site or other places in the body. Bleeding in the brain is a rare but serious complication.  Movement of the clot, requiring more treatment.  Allergic reactions to medicines or dyes.  Infection.  Bruising or swelling.  Damage to blood vessels.  Damage to the kidneys from the contrast dye. What happens before the procedure? Staying hydrated Follow instructions from your health care provider about hydration, which may include:  Up to 2 hours before the procedure - you may continue to drink clear liquids, such as water, clear fruit juice, black coffee, and plain tea.  Eating and drinking restrictions Follow instructions from your health care provider about eating and drinking, which may include:  8 hours before the procedure - stop  eating heavy meals or foods, such as meat, fried foods, or fatty foods.  6 hours before the procedure - stop eating light meals or foods, such as toast or cereal.  6 hours before the procedure - stop drinking milk or drinks that contain milk.  2 hours before the procedure - stop drinking clear liquids. Medicines Ask your health care provider about:  Changing or stopping your regular medicines. This is especially important if you are taking diabetes medicines or blood thinners.  Taking medicines such as aspirin and ibuprofen. These medicines can thin your blood. Do not take these medicines unless your health care provider tells you to take them.  Taking over-the-counter medicines, vitamins, herbs, and supplements. Tests You may have:  Blood tests to check how well your blood clots.  Tests to check your kidney function. General instructions  Do not use any products that contain nicotine or tobacco for at least 4 weeks before the procedure. These products include cigarettes, e-cigarettes, and chewing tobacco. If you need help quitting, ask your health care provider.  Plan to have someone take you home from the hospital or clinic.  Ask your health care provider what steps will be taken to help prevent infection. These may include washing your skin with a germ-killing soap. What happens during the procedure?   An IV will be inserted into one of your veins.  You will be given one or more of the following: ? A medicine to help you relax (sedative). ? A medicine to numb the surgical area (local anesthetic). This is given as an injection near   the incision. ? A medicine to make you fall asleep (general anesthetic).  A small incision will be made in your groin area or in your wrist.  A catheter will be placed through the incision and into a blood vessel that leads to the clot.  The catheter will be moved through the blood vessel toward the clot.  A dye, also called contrast dye, will  be injected through the catheter as it is moved forward. A type of X-ray (fluoroscopy) will be done to guide the movement of the catheter. The dye makes the catheter easy to see during imaging.  When the catheter reaches the clot, your health care provider will break up the clot by doing one of the following: ? Injecting a clot-dissolving medicine through the catheter. ? Threading a device through the catheter and using that device to break up the clot.  The catheter may be left in place to deliver clot-dissolving medicine during the next 24-72 hours.  Your incision may be covered with a bandage (dressing). The procedure may vary among health care providers and hospitals. What happens after the procedure?  You may need to stay in bed at the hospital to receive clot-dissolving medicine through your catheter.  Your blood pressure, heart rate, breathing rate, and blood oxygen level will be monitored until you leave the hospital or clinic.  You may continue to receive fluids and medicines through an IV. Your IV will also be used to remove blood for testing.  You will be given pain medicine as needed.  You may need to wear compression stockings or a compression sleeve on your arm or leg. These garments help to prevent blood clots and reduce swelling.  Your catheter and IV will be removed after treatment for the clot is complete. Summary  Catheter-directed thrombolysis is a procedure to remove a blood clot from inside a blood vessel.  Before the procedure, follow your health care provider's instructions about eating and drinking restrictions. You may be asked to stop eating and drinking several hours before the procedure.  During this procedure, a catheter is inserted through your skin into a blood vessel that leads to the clot.  The clot is either dissolved with medicine or broken up with a device. You may need to stay in the hospital for 24-72 hours while medicine continues to dissolve the  clot. This information is not intended to replace advice given to you by your health care provider. Make sure you discuss any questions you have with your health care provider. Document Revised: 06/17/2019 Document Reviewed: 06/17/2019 Elsevier Patient Education  2020 [employer].  Information on my medicine - ELIQUIS (apixaban)  This medication education was reviewed with me or my healthcare representative as part of my discharge preparation.  Why was Eliquis prescribed for you? Eliquis was prescribed to treat blood clots that may have been found in the veins of your legs (deep vein thrombosis) or in your lungs (pulmonary embolism) and to reduce the risk of them occurring again.  What do You need to know about Eliquis ? The starting dose is 10 mg (two 5 mg tablets) taken TWICE daily for the FIRST SEVEN (7) DAYS, then on (enter date)  01/12/2020  the dose is reduced to ONE 5 mg tablet taken TWICE daily.  Eliquis may be taken with or without food.   Try to take the dose about the same time in the morning and in the evening. If you have difficulty swallowing the tablet whole please discuss  with your pharmacist how to take the medication safely.  Take Eliquis exactly as prescribed and DO NOT stop taking Eliquis without talking to the doctor who prescribed the medication.  Stopping may increase your risk of developing a new blood clot.  Refill your prescription before you run out.  After discharge, you should have regular check-up appointments with your healthcare provider that is prescribing your Eliquis.    What do you do if you miss a dose? If a dose of ELIQUIS is not taken at the scheduled time, take it as soon as possible on the same day and twice-daily administration should be resumed. The dose should not be doubled to make up for a missed dose.  Important Safety Information A possible side effect of Eliquis is bleeding. You should call your healthcare provider right away if you  experience any of the following: ? Bleeding from an injury or your nose that does not stop. ? Unusual colored urine (red or dark brown) or unusual colored stools (red or black). ? Unusual bruising for unknown reasons. ? A serious fall or if you hit your head (even if there is no bleeding).  Some medicines may interact with Eliquis and might increase your risk of bleeding or clotting while on Eliquis. To help avoid this, consult your healthcare provider or pharmacist prior to using any new prescription or non-prescription medications, including herbals, vitamins, non-steroidal anti-inflammatory drugs (NSAIDs) and supplements.  This website has more information on Eliquis (apixaban): http://www.eliquis.com/eliquis/home

## 2020-01-05 NOTE — Progress Notes (Signed)
Pharmacy Antibiotic Note  Gabrielle Wade is a 64 y.o. female with h/o quadraplegia admitted on 12/23/2019 with possible HCAP found to have ESBL E.coli, MRSA and Candida parapilosis bacteremia.  Pharmacy has been consulted for vancomycin dosing. ID recommending vancomycin and rifampin through 02/02/20 and fluconazole through 1/22.  Renal function is stable at 0.83, LFTs wnl.  Plan: Vanc 1gm IV Q48H Fluconazole 400 IV Q24H  Rifampin 600mg  PT Q24H Monitor clinical progress, c/s, renal function F/u de-escalation plan/LOT, repeat vancomycin levels at new steady state next week    Height: 5\' 10"  (177.8 cm) Weight: 173 lb 15.1 oz (78.9 kg) IBW/kg (Calculated) : 68.5   Temp (24hrs), Avg:98.7 F (37.1 C), Min:97.5 F (36.4 C), Max:99.8 F (37.7 C)  Recent Labs  Lab 12/30/19 0458 12/30/19 0458 12/31/19 0426 12/31/19 0426 12/31/19 1326 01/01/20 0524 01/02/20 0708 01/03/20 0531 01/05/20 0301  WBC 2.8*  --  2.9*  --   --   --  4.2 4.3 6.7  CREATININE 1.01*   < > 1.02*   < > 0.91 0.85 0.81 0.73 0.83  VANCOTROUGH 32*  --   --   --   --   --   --   --   --    < > = values in this interval not displayed.    Estimated Creatinine Clearance: 73.1 mL/min (by C-G formula based on SCr of 0.83 mg/dL).    Allergies  Allergen Reactions  . Codeine   . Hydrocodone   . Influenza A (H1n1) Monovalent Vaccine   . Latex   . Morphine And Related   . Penicillins   . Shellfish Allergy   . Tape    Vanc 1/4 >> (2/14) Cefepime 1/4 >> 1/4 Merrem 1/5 >> 1/11 Fluc 1/5 >> (1/22) Rifampin 1/6 >> (2/14) 1/4 Nystatin >> 1/17  1/11 VP/VT 42/32 on 1g q24 >> decr 1g q48  1/4 BCx - MRSA and Ecoli 1/4 UCx - Klebsiella and ESBL Ecoli 1/5 MRSA PCR - positive 1/6 BCx - Candida parapsilosis + MSSA 1/8 BCx - negative 1/12 covid - negative   3/8, PharmD, BCPS Please check AMION for all [facility] contact numbers Clinical Pharmacist 01/05/2020 10:40 AM

## 2020-01-06 ENCOUNTER — Inpatient Hospital Stay (HOSPITAL_COMMUNITY): Payer: Medicare PPO

## 2020-01-06 DIAGNOSIS — J9601 Acute respiratory failure with hypoxia: Secondary | ICD-10-CM

## 2020-01-06 DIAGNOSIS — I82622 Acute embolism and thrombosis of deep veins of left upper extremity: Secondary | ICD-10-CM

## 2020-01-06 HISTORY — PX: IR US GUIDE VASC ACCESS RIGHT: IMG2390

## 2020-01-06 HISTORY — PX: IR FLUORO GUIDE CV LINE RIGHT: IMG2283

## 2020-01-06 LAB — CBC WITH DIFFERENTIAL/PLATELET
Abs Immature Granulocytes: 0.52 10*3/uL — ABNORMAL HIGH (ref 0.00–0.07)
Basophils Absolute: 0.1 10*3/uL (ref 0.0–0.1)
Basophils Relative: 1 %
Eosinophils Absolute: 0.7 10*3/uL — ABNORMAL HIGH (ref 0.0–0.5)
Eosinophils Relative: 8 %
HCT: 25.1 % — ABNORMAL LOW (ref 36.0–46.0)
Hemoglobin: 7.5 g/dL — ABNORMAL LOW (ref 12.0–15.0)
Immature Granulocytes: 7 %
Lymphocytes Relative: 16 %
Lymphs Abs: 1.3 10*3/uL (ref 0.7–4.0)
MCH: 28.8 pg (ref 26.0–34.0)
MCHC: 29.9 g/dL — ABNORMAL LOW (ref 30.0–36.0)
MCV: 96.5 fL (ref 80.0–100.0)
Monocytes Absolute: 0.3 10*3/uL (ref 0.1–1.0)
Monocytes Relative: 4 %
Neutro Abs: 5 10*3/uL (ref 1.7–7.7)
Neutrophils Relative %: 64 %
Platelets: 219 10*3/uL (ref 150–400)
RBC: 2.6 MIL/uL — ABNORMAL LOW (ref 3.87–5.11)
RDW: 17.7 % — ABNORMAL HIGH (ref 11.5–15.5)
WBC: 7.9 10*3/uL (ref 4.0–10.5)
nRBC: 0.3 % — ABNORMAL HIGH (ref 0.0–0.2)

## 2020-01-06 LAB — GLUCOSE, CAPILLARY
Glucose-Capillary: 106 mg/dL — ABNORMAL HIGH (ref 70–99)
Glucose-Capillary: 108 mg/dL — ABNORMAL HIGH (ref 70–99)
Glucose-Capillary: 115 mg/dL — ABNORMAL HIGH (ref 70–99)
Glucose-Capillary: 119 mg/dL — ABNORMAL HIGH (ref 70–99)
Glucose-Capillary: 129 mg/dL — ABNORMAL HIGH (ref 70–99)
Glucose-Capillary: 145 mg/dL — ABNORMAL HIGH (ref 70–99)
Glucose-Capillary: 76 mg/dL (ref 70–99)

## 2020-01-06 LAB — BASIC METABOLIC PANEL
Anion gap: 5 (ref 5–15)
BUN: 94 mg/dL — ABNORMAL HIGH (ref 8–23)
CO2: 32 mmol/L (ref 22–32)
Calcium: 8.7 mg/dL — ABNORMAL LOW (ref 8.9–10.3)
Chloride: 103 mmol/L (ref 98–111)
Creatinine, Ser: 0.79 mg/dL (ref 0.44–1.00)
GFR calc Af Amer: >60 mL/min (ref >60)
GFR calc non Af Amer: >60 mL/min (ref >60)
Glucose, Bld: 72 mg/dL (ref 70–99)
Potassium: 4.9 mmol/L (ref 3.5–5.1)
Sodium: 140 mmol/L (ref 135–145)

## 2020-01-06 LAB — MAGNESIUM: Magnesium: 2.1 mg/dL (ref 1.7–2.4)

## 2020-01-06 LAB — SARS CORONAVIRUS 2 (TAT 6-24 HRS): SARS Coronavirus 2: NEGATIVE

## 2020-01-06 MED ORDER — LIDOCAINE HCL 1 % IJ SOLN
INTRAMUSCULAR | Status: DC | PRN
  Administered 2020-01-06: 5 mL

## 2020-01-06 MED ORDER — LIDOCAINE HCL 1 % IJ SOLN
INTRAMUSCULAR | Status: AC
  Filled 2020-01-06: qty 20

## 2020-01-06 MED ORDER — RIFABUTIN 150 MG PO CAPS: 300.0000 mg | ORAL_CAPSULE | Freq: Every day | ORAL | 0 refills | Status: AC

## 2020-01-06 MED ORDER — RIFABUTIN 150 MG PO CAPS
300.0000 mg | ORAL_CAPSULE | Freq: Every day | ORAL | Status: DC
  Administered 2020-01-06 – 2020-01-07 (×2): 300 mg via ORAL
  Filled 2020-01-06 (×3): qty 2

## 2020-01-06 NOTE — Sedation Documentation (Signed)
Patient is resting comfortably. 

## 2020-01-06 NOTE — Progress Notes (Signed)
Patient ID: Gabrielle Wade, female   DOB: 08/07/1956, 64 y.o.   MRN: [id number]  PROGRESS NOTE    Gabrielle Wade  OZH:[id number] DOB: 09-10-1956 DOA: 12/23/2019 PCP: Townsend Roger, MD   Brief Narrative:  64 year old female with history of anxiety/depression, PTSD, MRSA epidural abscess, quadriplegia, diabetes mellitus type 2, chronic kidney disease stage III, COPD, chronic hypoxic respiratory failure on chronic trach/vent dependence, chronic dysphagia status post PEG tube, GERD, anemia presented with hemoglobin of 6.8 and WBC of 0.6 per [facility].  On presentation, chest x-ray showed bilateral airspace opacities.  She was found to have MRSA, ESBL E. coli bacteremia along with candidemia.  ID and oncology were consulted.  She was treated with meropenem, and vancomycin and Diflucan.  PCCM was consulted as well for trach/vent management.  Echo showed EF of 50 to 50% with grade 2 diastolic dysfunction.  She was transfused 1 unit of packed red cells on 12/28/2019.  Subsequently, she has remained stable and is waiting to be discharged back to SNF with vent facility.  Currently medically stable for discharge.  Assessment & Plan:   Sepsis: Present on admission -Resolved.  Currently hemodynamically stable.  Polymicrobial bloodstream infection including MRSA, ESBL E. coli bacteremia and candidemia with Candida parapsilosis -Source was probably right arm PICC line which was removed after admission.  Left heel x-ray did not show evidence of osteomyelitis.  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  Right upper extremity duplex was negative for DVT. -Has completed treatment for E. coli bacteremia with meropenem. -Currently on Diflucan and vancomycin.  ID has recommended vancomycin till 02/02/2020 along with oral rifampin to complete 6 weeks of therapy.  Continue Diflucan till 01/10/2020.  Repeat blood cultures from 12/27/2019  have been negative so far.  Left brachial vein DVT involving the PICC line -Duplex ultrasound on 01/05/2020 showed the same.  Eliquis was started on 01/05/2020.  Once we have alternate IV access, left-sided PICC line will have to be discontinued.  IR has been consulted for the same  Right forearm swelling -Soft tissue ultrasound showed phlegmonous changes/developing abscess.  Currently on vancomycin.  Will consult general surgery to see if there is any need for surgical intervention.  Neutropenia/anemia -Likely due to sepsis from bacteremia. -Hematology evaluated the patient during the hospitalization. -Status post 1 unit of packed red cell transfusion on 12/28/2019. -Subsequently white blood cell count has normalized. -Transfuse packed red cell if hemoglobin is less than 7. -Outpatient follow-up with hematology  Hypernatremia -Improved.  Continue free water.  Monitor  Acute on chronic hypoxic respiratory failure/chronic vent dependence on tracheostomy -PCCM managing tracheostomy on ventilator.  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  Acute kidney injury -Resolved  Diabetes mellitus type 2 -Continue long-acting and short-acting insulin with CBGs with SSI  Hypoalbuminemia -Continue tube feeding as per dietary recommendations.  Generalized deconditioning -Overall prognosis is very poor.  Palliative care consultation appreciated.  Currently remains full code.   DVT prophylaxis: Not on heparin/Lovenox because of anemia Code Status: Full Family Communication: None at bedside  disposition Plan: SNF with vent capacity in 1-2 days if remains medically stable Consultants: PCCM/ID/hematology/IR  Procedures: None  Antimicrobials:  Anti-infectives (From admission, onward)   Start     Dose/Rate Route Frequency Ordered Stop   01/01/20 0700  vancomycin (VANCOCIN) IVPB 1000 mg/200 mL premix     1,000 mg  200 mL/hr  over 60 Minutes Intravenous Every 48 hours 12/30/19 0819     12/31/19 0000  vancomycin IVPB     1,000 mg Intravenous Every 48 hours 12/31/19 1244 02/03/20 2359   12/31/19 0000  fluconazole (DIFLUCAN) IVPB     400 mg Intravenous Every 24 hours 12/31/19 1244 01/11/20 2359   12/31/19 0000  rifampin (RIFADIN) 60 mg/mL SUSP oral suspension  Status:  Discontinued     600 mg Per Tube Daily-1800 12/31/19 1244 01/05/20    12/28/19 0800  fluconazole (DIFLUCAN) IVPB 200 mg  Status:  Discontinued     200 mg 100 mL/hr over 60 Minutes Intravenous Every 24 hours 12/27/19 0012 12/27/19 0724   12/28/19 0800  fluconazole (DIFLUCAN) IVPB 400 mg     400 mg 100 mL/hr over 120 Minutes Intravenous Every 24 hours 12/27/19 0724     12/27/19 0609  vancomycin (VANCOCIN) IVPB 1000 mg/200 mL premix  Status:  Discontinued     1,000 mg 200 mL/hr over 60 Minutes Intravenous Every 24 hours 12/26/19 0742 12/30/19 0740   12/27/19 0230  fluconazole (DIFLUCAN) IVPB 400 mg     400 mg 100 mL/hr over 120 Minutes Intravenous Every 1 hr x 2 12/27/19 0222 12/27/19 0714   12/27/19 0100  fluconazole (DIFLUCAN) IVPB 800 mg  Status:  Discontinued     800 mg 200 mL/hr over 120 Minutes Intravenous  Once 12/27/19 0012 12/27/19 0222   12/26/19 2200  meropenem (MERREM) 1 g in sodium chloride 0.9 % 100 mL IVPB     1 g 200 mL/hr over 30 Minutes Intravenous Every 12 hours 12/26/19 1337 12/30/19 2333   12/26/19 1800  rifampin (RIFADIN) 60 mg/mL oral suspension 600 mg  Status:  Discontinued     600 mg Per Tube Daily-1800 12/25/19 2207 01/05/20 1448   12/26/19 1000  fluconazole (DIFLUCAN) tablet 200 mg  Status:  Discontinued     200 mg Per Tube Daily 12/26/19 0514 12/27/19 0012   12/26/19 0600  vancomycin (VANCOCIN) IVPB 1000 mg/200 mL premix  Status:  Discontinued     1,000 mg 200 mL/hr over 60 Minutes Intravenous Every 48 hours 12/24/19 0512 12/26/19 0742   12/25/19 1900  meropenem (MERREM) 1 g in sodium chloride 0.9 % 100 mL IVPB  Status:   Discontinued     1 g 200 mL/hr over 30 Minutes Intravenous Every 8 hours 12/25/19 1838 12/26/19 1337   12/25/19 1630  rifampin (RIFADIN) 60 mg/mL oral suspension 600 mg  Status:  Discontinued     600 mg Per Tube Daily 12/25/19 1553 12/25/19 2207   12/24/19 2250  meropenem (MERREM) 1 g in sodium chloride 0.9 % 100 mL IVPB  Status:  Discontinued     1 g 200 mL/hr over 30 Minutes Intravenous Every 8 hours 12/24/19 1942 12/25/19 1838   12/24/19 1815  fluconazole (DIFLUCAN) 40 MG/ML suspension 200 mg  Status:  Discontinued     200 mg Per Tube Daily 12/24/19 1804 12/26/19 0514   12/24/19 1445  fluconazole (DIFLUCAN) 40 MG/ML suspension 200 mg  Status:  Discontinued     200 mg Per Tube Daily 12/24/19 1436 12/24/19 1804   12/24/19 1000  ceFEPIme (MAXIPIME) 2 g in sodium chloride 0.9 % 100 mL IVPB  Status:  Discontinued     2 g 200 mL/hr over 30 Minutes Intravenous Every 12 hours 12/24/19 0512 12/24/19 0859   12/24/19 1000  meropenem (MERREM) 1 g in sodium chloride 0.9 % 100 mL IVPB  Status:  Discontinued     1 g 200 mL/hr over 30 Minutes Intravenous Every 12 hours 12/24/19 0859 12/24/19 1942   12/24/19 0315  vancomycin (VANCOREADY) IVPB 1250 mg/250 mL     1,250 mg 166.7 mL/hr over 90 Minutes Intravenous  Once 12/24/19 0250 12/24/19 0819   12/23/19 2045  ceFEPIme (MAXIPIME) 2 g in sodium chloride 0.9 % 100 mL IVPB     2 g 200 mL/hr over 30 Minutes Intravenous  Once 12/23/19 2030 12/23/19 2201       Subjective: Patient seen and examined at bedside.  Poor historian.  Nods her head to some questions.  No overnight fever or vomiting reported  objective: Vitals:   01/06/20 0400 01/06/20 0423 01/06/20 0500 01/06/20 0600  BP: (!) 100/50  (!) 101/54 (!) 92/46  Pulse: 72  71 68  Resp: _0 Temp:  (!) 94.5 F (34.7 C)    TempSrc:  Oral    SpO2: 100%  99% 98%  Weight:   98.4 kg   Height:        Intake/Output Summary (Last 24 hours) at 01/06/2020 0738 Last data filed at 01/06/2020  0500 Gross per 24 hour  Intake 1499.86 ml  Output 1820 ml  Net -320.14 ml   Filed Weights   01/04/20 0431 01/05/20 0114 01/06/20 0500  Weight: 86.3 kg 78.9 kg 98.4 kg    Examination:  General exam:  Looks chronically ill and extremely deconditioned.  No distress.  Poor historian. 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  PEG tube present.  Bowel sounds heard.   Extremities: Bilateral lower extremity edema present.  Bilateral upper extremities are swollen as well. Central nervous system: Quadriplegic.  Nods her head to some questions.  Lymph: No cervical lymphadenopathy Psychiatry: Flat affect    Data Reviewed: I have personally reviewed following labs and imaging studies  CBC: Recent Labs  Lab 12/31/19 0426 12/31/19 0426 12/31/19 1037 01/02/20 0708 01/03/20 0531 01/05/20 0301 01/06/20 0350  WBC 2.9*  --   --  4.2 4.3 6.7 7.9  NEUTROABS  --   --   --  1.8 2.6 3.6 5.0  HGB 7.6*   < > 8.5* 7.7* 7.7* 7.3* 7.5*  HCT 26.7*   < > 25.0* 26.9* 26.4* 24.4* 25.1*  MCV 101.9*  --   --  100.7* 99.2 97.2 96.5  PLT 164  --   --  196 199 238 219   < > = values in this interval not displayed.   Basic Metabolic Panel: Recent Labs  Lab 01/01/20 0524 01/02/20 0708 01/03/20 0531 01/05/20 0301 01/06/20 0350  NA 147* 149* 144 141 140  K 4.1 4.4 4.6 4.7 4.9  CL 108 110 105 103 103  CO2 33* 33* 32 32 32  GLUCOSE 149* 146* 105* 133* 72  BUN 101* 98* 98* 97* 94*  CREATININE 0.85 0.81 0.73 0.83 0.79  CALCIUM 9.3 9.1 9.0 8.7* 8.7*  MG  --  2.1 2.0 2.2 2.1   GFR: Estimated Creatinine Clearance: 89.1 mL/min (by C-G formula based on SCr of 0.79 mg/dL). Liver Function Tests: Recent Labs  Lab 12/31/19 0426 01/01/20 0524 01/03/20 0531 01/05/20 0301  AST _1 ALT _2 ALKPHOS 184* 170* 169* 188*  BILITOT 0.6 0.6 0.5 0.7  PROT 7.0 7.2 6.9 7.0  ALBUMIN 1.2* 1.3* 1.3* 1.3*   No results for input(s): LIPASE, AMYLASE in the last 168 hours. No results for input(s): AMMONIA in the last 168 hours. Coagulation Profile: No results for input(s): INR, PROTIME in the last 168 hours. Cardiac Enzymes: No results for input(s): CKTOTAL, CKMB, CKMBINDEX, TROPONINI in the last 168 hours. BNP (last 3 results) No results for input(s): PROBNP in the last 8760 hours. HbA1C: No results for input(s): HGBA1C in the last 72 hours. CBG: Recent Labs  Lab 01/05/20 0757 01/05/20 1144 01/05/20 1538 01/06/20 0022 01/06/20 0415  GLUCAP 77 110* 127* 115* 76   Lipid Profile: No results for input(s): CHOL, HDL, LDLCALC, TRIG, CHOLHDL, LDLDIRECT in the last 72 hours. Thyroid Function Tests: No results for input(s): TSH, T4TOTAL, FREET4, T3FREE, THYROIDAB in the last 72 hours. Anemia Panel: No results for input(s): VITAMINB12, FOLATE, FERRITIN, TIBC, IRON, RETICCTPCT in the last 72 hours. Sepsis Labs: No results for input(s): PROCALCITON, LATICACIDVEN in the last 168 hours.  Recent Results (from the past 240 hour(s))  Culture, blood (routine x 2)     Status: None   Collection Time: 12/27/19  9:20 AM   Specimen: BLOOD RIGHT ARM  Result Value Ref Range Status   Specimen Description BLOOD RIGHT ARM  Final   Special Requests   Final    BOTTLES DRAWN AEROBIC AND ANAEROBIC Blood Culture results may not be optimal due to an inadequate volume of blood received in culture bottles   Culture   Final    NO GROWTH 5 DAYS Performed at [facility], [place] [address], [place], [place] [address]    Report Status 01/01/2020 FINAL  Final  Culture, blood (routine x 2)     Status: None   Collection Time: 12/27/19  9:30 AM   Specimen: BLOOD LEFT HAND  Result Value Ref Range Status   Specimen Description BLOOD LEFT HAND  Final   Special Requests   Final    BOTTLES  DRAWN AEROBIC ONLY Blood Culture adequate volume   Culture   Final    NO GROWTH 5 DAYS Performed at [facility], [place] [address] [place], [place] [address]    Report Status 01/01/2020 FINAL  Final  SARS CORONAVIRUS 2 (TAT 6-24 HRS) Nasopharyngeal Nasopharyngeal Swab     Status: None   Collection Time: 12/31/19  1:40 PM   Specimen: Nasopharyngeal Swab  Result Value Ref Range Status   SARS Coronavirus 2 NEGATIVE NEGATIVE Final    Comment: (NOTE) SARS-CoV-2 target nucleic acids are NOT DETECTED. The SARS-CoV-2 RNA is generally detectable in upper and lower respiratory specimens during the acute phase of infection. Negative results do not preclude SARS-CoV-2 infection, do not rule out co-infections with other pathogens, and should not be used as the sole basis for treatment or other patient management decisions. Negative results must be combined with clinical observations, patient history, and epidemiological information. The expected result is Negative. Fact Sheet for Patients: SugarRoll.be Fact Sheet for Healthcare Providers: https://www.woods-mathews.com/ This test is not yet approved or cleared by the [place] FDA and  has been authorized for detection and/or diagnosis of SARS-CoV-2 by FDA under an Emergency Use Authorization (EUA). This EUA will remain  in effect (meaning this test can be used) for the duration of the COVID-19 declaration under Section 56 4(b)(1) of the Act, 21 U.S.C. section 360bbb-3(b)(1), unless the authorization is terminated or revoked sooner. Performed at [facility], [place]  [address], [place], [place] [address]          Radiology Studies: [place] RT UPPER EXTREM LTD SOFT TISSUE NON VASCULAR  Result Date: 01/05/2020 CLINICAL DATA:  Right upper extremity swelling, evaluate for abscess EXAM: ULTRASOUND RIGHT UPPER EXTREMITY LIMITED TECHNIQUE: Ultrasound examination of the upper extremity soft tissues  was performed in the area of clinical concern. COMPARISON:  None. FINDINGS: Within the distal aspect of the right forearm, there is marked soft tissue swelling and subcutaneous edema. At the area of clinical concern, there is a more focal area of ill-defined complex fluid within the subcutaneous tissues. No thickened, well-defined wall. No sonographic evidence of soft tissue gas. IMPRESSION: Extensive soft tissue swelling and edema at the right distal forearm with ill-defined fluid at the site of clinical concern suggestive of phlegmonous change/developing abscess. No well-defined wall to suggest a drainable component at this time. Electronically Signed   By: Davina Poke D.O.   On: 01/05/2020 13:28   VAS [place] UPPER EXTREMITY VENOUS DUPLEX  Result Date: 01/05/2020 UPPER VENOUS STUDY  Indications: Edema Limitations: Poor ultrasound/tissue interface, bandages, line and depth of vessels, patient's pain with touch to right arm. Comparison Study: Prior right upper extremity venous duplex done 12/25/19 Performing Technologist: Sharion Dove RVS  Examination Guidelines: A complete evaluation includes B-mode imaging, spectral Doppler, color Doppler, and power Doppler as needed of all accessible portions of each vessel. Bilateral testing is considered an integral part of a complete examination. Limited examinations for reoccurring indications may be performed as noted.  Right Findings: +----------+------------+---------+-----------+----------+---------------------+ RIGHT     CompressiblePhasicitySpontaneousProperties       Summary        +----------+------------+---------+-----------+----------+---------------------+ IJV           Full       Yes       Yes                                    +----------+------------+---------+-----------+----------+---------------------+ Subclavian               Yes       Yes                                     +----------+------------+---------+-----------+----------+---------------------+ Axillary                 Yes       Yes                                    +----------+------------+---------+-----------+----------+---------------------+ Brachial                                            only able to discern                                                       one of the brachial  veins         +----------+------------+---------+-----------+----------+---------------------+ Radial                                                 patent by color    +----------+------------+---------+-----------+----------+---------------------+ Ulnar                                                  Not visualized     +----------+------------+---------+-----------+----------+---------------------+ Cephalic      Full                                                        +----------+------------+---------+-----------+----------+---------------------+ Basilic       Full                                                        +----------+------------+---------+-----------+----------+---------------------+  Left Findings: +----------+------------+---------+-----------+----------+---------------------+ LEFT      CompressiblePhasicitySpontaneousProperties       Summary        +----------+------------+---------+-----------+----------+---------------------+ IJV           Full       Yes       Yes                                    +----------+------------+---------+-----------+----------+---------------------+ Subclavian               Yes       Yes                                    +----------+------------+---------+-----------+----------+---------------------+ Axillary                 Yes       Yes                                     +----------+------------+---------+-----------+----------+---------------------+ Brachial    Partial      No        No                 DVT noted in the                                                         brachial vein with  PICC line       +----------+------------+---------+-----------+----------+---------------------+ Radial                                                 patent by color    +----------+------------+---------+-----------+----------+---------------------+ Ulnar                                                  patent by color    +----------+------------+---------+-----------+----------+---------------------+ Cephalic      Full                                                        +----------+------------+---------+-----------+----------+---------------------+ Basilic       Full                                                        +----------+------------+---------+-----------+----------+---------------------+  Summary:  Right: No evidence of deep vein thrombosis in the upper extremity. No evidence of superficial vein thrombosis in the upper extremity. However, unable to visualize one of the Brachial veins.  Left: No evidence of superficial vein thrombosis in the upper extremity. Findings consistent with age indeterminate deep vein thrombosis involving the left brachial vein containing PICC line.  *See table(s) above for measurements and observations.  Diagnosing physician: Monica Martinez MD Electronically signed by Monica Martinez MD on 01/05/2020 at 4:02:28 PM.    Final         Scheduled Meds: . sodium chloride   Intravenous Once  . apixaban  10 mg Oral BID   Followed by  . [START ON 01/12/2020] apixaban  5 mg Oral BID  . chlorhexidine gluconate (MEDLINE KIT)  15 mL Mouth Rinse BID  . Chlorhexidine Gluconate Cloth  6 each Topical Daily  . feeding supplement (PRO-STAT  SUGAR FREE 64)  30 mL Per Tube TID  . ferrous sulfate  300 mg Per Tube Q breakfast  . free water  300 mL Per Tube Q4H  . Gerhardt's butt cream  1 application Topical QID  . insulin aspart  0-15 Units Subcutaneous Q4H  . insulin aspart  5 Units Subcutaneous Q4H  . insulin glargine  6 Units Subcutaneous Daily  . ipratropium-albuterol  3 mL Nebulization TID  . levothyroxine  25 mcg Oral Q0600  . mouth rinse  15 mL Mouth Rinse 10 times per day  . nutrition supplement (JUVEN)  1 packet Per Tube BID  . pantoprazole sodium  40 mg Per Tube Q1200  . sodium chloride flush  10-40 mL Intracatheter Q12H  . triamcinolone 0.1 % cream : eucerin   Topical BID   Continuous Infusions: . sodium chloride Stopped (01/02/20 0755)  . feeding supplement (JEVITY 1.5 CAL/FIBER) 1,000 mL (01/04/20 1629)  . fluconazole (DIFLUCAN) IV Stopped (01/05/20 1118)  . vancomycin Stopped (01/05/20 0732)          Aline August, MD [facility] 01/06/2020, 7:38 AM

## 2020-01-06 NOTE — Progress Notes (Signed)
Daily Progress Note   Patient Name: Gabrielle Wade       Date: 01/06/2020 DOB: 07-26-1956  Age: 64 y.o. MRN#: [id number] Attending Physician: Aline August, MD Primary Care Physician: Townsend Roger, MD Admit Date: 12/23/2019  Reason for Consultation/Follow-up: Establishing goals of care and Psychosocial/spiritual support  Met with patient and accompanied by Juan Quam, SLP and Azzie Almas, ICU RN to attempt to ask [place] questions.  Patient has significant anxiety around wearing a PMV so we tried to avoid utilizing it by asking Yes/No questions.     Subjective: After chatting for a minute about her family.  I asked permission to ask some important questions - the patient nodded yes.  I explained that she has a tube going into her throat to breathe and another going into her for feeding and she is living in a nursing facility.   I asked if she wanted [place] to continue doing all of these things to her to keep her going or would she prefer that we focus on her comfort.  I explained that if we focused on her comfort she would pass away very comfortably.   She nodded Yes that she wanted [place] to continue doing these things to keep her alive - but then she stared off at the wall.  I expressed that it looked like there was a lot more she wanted to talk about - and she nodded Yes.  I asked if she had questions and she indicated she did.  Estill Bamberg talked with her about using the PMV for a brief period of time just to allow her to ask questions and express her wishes. (10 minutes rather than 30 minutes)  The patient seemed agreeable and mouthed "with medicine" which we took to mean anti-anxiety medication.  We will try to gather together again later today with respiratory to ascertain Mrs. Pedley questions about  goals of care.   Estill Bamberg pointed out that sometimes talking reduces anxiety and the patient agreed.   Assessment: Patient alert, mouthing words that unfortunately I am unable to understand.   Patient Profile/HPI:  64 y.o. female  with past medical history of  DM, CKD 3, COPD, PTSD, quadriplegia with trach dependence and PEG tube with who was admitted on 12/23/2019 with MRSA, ESBL E coli bacteremia with neutropenia.  The patient has  been treated and is stable for discharge.  We have been consulted for [place].   Length of Stay: 14  Current Medications: Scheduled Meds:  . sodium chloride   Intravenous Once  . apixaban  10 mg Oral BID   Followed by  . [START ON 01/12/2020] apixaban  5 mg Oral BID  . chlorhexidine gluconate (MEDLINE KIT)  15 mL Mouth Rinse BID  . Chlorhexidine Gluconate Cloth  6 each Topical Daily  . feeding supplement (PRO-STAT SUGAR FREE 64)  30 mL Per Tube TID  . ferrous sulfate  300 mg Per Tube Q breakfast  . free water  300 mL Per Tube Q4H  . Gerhardt's butt cream  1 application Topical QID  . insulin aspart  0-15 Units Subcutaneous Q4H  . insulin aspart  5 Units Subcutaneous Q4H  . insulin glargine  6 Units Subcutaneous Daily  . ipratropium-albuterol  3 mL Nebulization TID  . levothyroxine  25 mcg Oral Q0600  . mouth rinse  15 mL Mouth Rinse 10 times per day  . nutrition supplement (JUVEN)  1 packet Per Tube BID  . pantoprazole sodium  40 mg Per Tube Q1200  . rifabutin  300 mg Oral Daily  . sodium chloride flush  10-40 mL Intracatheter Q12H  . triamcinolone 0.1 % cream : eucerin   Topical BID    Continuous Infusions: . sodium chloride Stopped (01/02/20 0755)  . feeding supplement (JEVITY 1.5 CAL/FIBER) 1,000 mL (01/04/20 1629)  . fluconazole (DIFLUCAN) IV 400 mg (01/06/20 0804)  . vancomycin Stopped (01/05/20 0732)    PRN Meds: sodium chloride, acetaminophen **OR** acetaminophen, ipratropium-albuterol, LORazepam, oxyCODONE, sodium chloride flush  Physical  Exam      Chronically ill appearing female, awake, alert, on trach/ventilator CV rrr resp no distress  Vital Signs: BP (!) 101/48   Pulse 76   Temp (!) 96.4 F (35.8 C) (Axillary)   Resp 15   Ht _0  (1.778 m)   Wt 98.4 kg   SpO2 97%   BMI 31.13 kg/m  SpO2: SpO2: 97 % O2 Device: O2 Device: Ventilator O2 Flow Rate:    Intake/output summary:   Intake/Output Summary (Last 24 hours) at 01/06/2020 1217 Last data filed at 01/06/2020 1200 Gross per 24 hour  Intake 1231.53 ml  Output 1725 ml  Net -493.47 ml   LBM: Last BM Date: 01/06/20 Baseline Weight: Weight: 91.3 kg Most recent weight: Weight: 98.4 kg       Palliative Assessment/Data:  20%      Patient Active Problem List   Diagnosis Date Noted  . Bacteremia   . Quadriplegia and quadriparesis ([place])   . Palliative care encounter   . Chronic respiratory failure ([place])   . Candidemia ([place]) 12/27/2019  . 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 MRSA bacteremia 12/24/2019  . E coli bacteremia 12/24/2019  . Pressure ulcer of left heel, stage 3 ([place]) 12/24/2019  . Edema of right upper arm 12/24/2019  . Anemia 12/23/2019  . Neutropenia ([place]) 12/23/2019  . Quadriplegia ([place]) 12/23/2019  . Diabetes ([place]) 12/23/2019    Palliative Care Plan    Recommendations/Plan: Full Code / Full Scope Palliative Care to follow at next venue to continue [place] conversations with patient and family.  Please keep in mind she has PTSD.  She also has anxiety around the PMV.  Goals of Care and Additional Recommendations: Limitations on Scope of Treatment: Full Scope Treatment  Code Status:  Full code  Prognosis:  < 12 months given bedbound status  with trach/peg and multiple antibiotic resistant infections.   Discharge Planning: LTAC with Palliative to follow please.  Care plan was discussed with RN, SLP, PMT RN.  Thank you for allowing the Palliative Medicine Team to assist in the care of this patient.  Total time spent:  25 min.      Greater than 50%  of this time was spent counseling and coordinating care related to the above assessment and plan.  Florentina Jenny, PA-C Palliative Medicine  Please contact Palliative MedicineTeam phone at [phone] for questions and concerns between 7 am - 7 pm.   Please see AMION for individual provider pager numbers.

## 2020-01-06 NOTE — Progress Notes (Addendum)
10am: CSW received call from Clydie Braun at [facility] stating the facility can offer a bed for this patient. Clydie Braun will obtain insurance authorization from [place]. It will take approximately one business day for authorization to be obtained. Clydie Braun requested the patient receive a new COVID swab before transferring to the facility.  CSW spoke with patient's husband Darcel Bayley who is agreeable to accepting the bed offer from [facility].  CSW notified Brett Albino, [place] of request for additional COVID screening.   8am: CSW attempted to reach [place] at [facility] without success, voicemail was left requesting a return call.  Edwin Dada, MSW, LCSW-A Transitions of Care  Clinical Social Worker  [facility] Emergency Departments  Medical ICU [phone]

## 2020-01-06 NOTE — Progress Notes (Signed)
Physical Therapy Treatment Patient Details Name: Gabrielle Wade MRN: [id number] DOB: [birth year] Today's Date: 01/06/2020    History of Present Illness Gabrielle Wade  is a 64 y.o. female, w anxiety/ depression,PTSD,  hx of epidural abscess (mrsa), w quadriplegia , Dm2, CKD stage3,  Copd, Chronic Trach/ vent dependence, Gerd, chronic dysphagia, Anemia, presents with ? Thrombocytopenia, Hgb 6.8, and wbc 0.6 per [facility].     PT Comments    Pt seen with OT with initial goal to transfer pt OOB via maxisky. Upon arrival pt soiled In liquid stool. Pt with increased anxiety with rolling for hygiene. Had to assist RN with holding pt to place flexiseal. Place pt in chair position, BP dec to 92/58, then increase to 105/62 s/p 2 min in place. Pt positioned optimally in the bed with bilat UE elevated to assist with edema management. Pt very anxious t/o entire session stating "I can't breath, I'm going to throw up". Pt given max verbal cues to slow breathing and comfort cues letting her know her saturations were stable. Due to anxiety, increased RR, drop in BP, and increased time for hygiene unable to transfer pt OOB today. Also unsure of patients prognosis and if there will be a change in medical plan as per palliative's note they had a discussion with the daughter about EOL and QOL. Acute PT to cont to follow as long as family and patient wishes.     Follow Up Recommendations  LTACH;Supervision/Assistance - 24 hour     Equipment Recommendations  Other (comment)    Recommendations for Other Services       Precautions / Restrictions Precautions Precautions: Fall Precaution Comments: very edematous, placed flexiseal Restrictions Weight Bearing Restrictions: No    Mobility  Bed Mobility Overal bed mobility: Needs Assistance Bed Mobility: Rolling Rolling: Total assist;+2 for physical assistance         General bed mobility comments: pt soiled with stool. totalAx2 to roll L/R. Pt grimacing in  pain and asking to stop however hygiene had to be completed as pt with a lot of stool, pt's bottom with skin break down and bleeding  Transfers                 General transfer comment: NT  Ambulation/Gait             General Gait Details: unable   Stairs             Wheelchair Mobility    Modified Rankin (Stroke Patients Only)       Balance Overall balance assessment: (pt dependent for all as she is a quadraplegic)                                          Cognition Arousal/Alertness: Awake/alert Behavior During Therapy: Anxious Overall Cognitive Status: Difficult to assess                                 General Comments: pt very anxious stating "I can't breath, I'm going to through up" Pt's sats remained stable.      Exercises      General Comments General comments (skin integrity, edema, etc.): pt bleeding from skin breakdown on bottom, pt edematous t/o body, pt also with extremely dry and flaky skin around neck      Pertinent Vitals/Pain Pain Assessment:  Faces Faces Pain Scale: Hurts even more Pain Location: ge Pain Descriptors / Indicators: Discomfort Pain Intervention(s): Monitored during session    Home Living                      Prior Function            PT Goals (current goals can now be found in the care plan section) Progress towards PT goals: Not progressing toward goals - comment(limited by anxiety, unsure prognosis)    Frequency    Min 1X/week      PT Plan Current plan remains appropriate    Co-evaluation PT/OT/SLP Co-Evaluation/Treatment: Yes Reason for Co-Treatment: Complexity of the patient's impairments (multi-system involvement) PT goals addressed during session: Mobility/safety with mobility        AM-PAC PT "6 Clicks" Mobility   Outcome Measure  Help needed turning from your back to your side while in a flat bed without using bedrails?: Total Help needed  moving from lying on your back to sitting on the side of a flat bed without using bedrails?: Total Help needed moving to and from a bed to a chair (including a wheelchair)?: Total Help needed standing up from a chair using your arms (e.g., wheelchair or bedside chair)?: Total Help needed to walk in hospital room?: Total Help needed climbing 3-5 steps with a railing? : Total 6 Click Score: 6    End of Session Equipment Utilized During Treatment: Oxygen(vent via trach) Activity Tolerance: (limited by anxiety) Patient left: in bed;with call bell/phone within reach;with bed alarm set;with SCD's reapplied(with be in chair position) Nurse Communication: Mobility status PT Visit Diagnosis: Muscle weakness (generalized) (M62.81)     Time: 8270-7867 PT Time Calculation (min) (ACUTE ONLY): 38 min  Charges:  $Therapeutic Activity: 8-22 mins                     Kittie Plater, PT, DPT Acute Rehabilitation Services Pager #: [phone] Office #: [phone]    Berline Lopes 01/06/2020, 9:55 AM

## 2020-01-06 NOTE — Consult Note (Addendum)
Reason for Consult:Right FA abscess Referring Physician: Geannie RisenK Gabrielle Wade  Gabrielle EchevariaCharlotte Wade is an 64 y.o. female.  HPI: Gabrielle Wade was admitted with sepsis from [facility] 2w ago. She developed redness and swelling over her distal dorsal FA, likely from IV. US showed phlegmatic changes but no abscess and hand surgery was consulted. She is RHD but other history is hard to obtain 2/2 tracheostomy and ventilated status.  Past Medical History:  Diagnosis Date  . Acute and chronic respiratory failure (acute-on-chronic) (HCC)   . Acute posthemorrhagic anemia   . Anemia in other chronic diseases classified elsewhere   . Anxiety   . Anxiety   . Asthma   . Bradycardia   . Chronic kidney disease, stage 3   . Compartment syndrome, unspecified, sequela   . COPD (chronic obstructive pulmonary disease) (HCC)   . 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 Diabetes mellitus without complication (HCC)   . Dysphagia, oropharyngeal   . GERD (gastroesophageal reflux disease)   . Hyperkalemia   . Hypotension   . Intraspinal abscess and granuloma   . MDD (major depressive disorder)   . MRSA (methicillin resistant Staphylococcus aureus)   . Presence of cardiac pacemaker   . Pressure ulcer of unspecified heel, unspecified stage   . PTSD (post-traumatic stress disorder)   . Quadriplegia [facility](HCC)     Past Surgical History:  Procedure Laterality Date  . PEG TUBE PLACEMENT    . TRACHEOSTOMY      Family History  Family history unknown: Yes    Social History:  reports that she has quit smoking. She has never used smokeless tobacco. She reports previous alcohol use. She reports previous drug use.  Allergies:  Allergies  Allergen Reactions  . Codeine   . Hydrocodone   . Influenza A (H1n1) Monovalent Vaccine   . Latex   . Morphine And Related   . Penicillins   . Shellfish Allergy   . Tape     Medications: I have reviewed the patient's current medications.  Results for orders placed or performed during the hospital  encounter of 12/23/19 (from the past 48 hour(s))  Glucose, capillary     Status: Abnormal   Collection Time: 01/04/20  4:24 PM  Result Value Ref Range   Glucose-Capillary 106 (H) 70 - 99 mg/dL  Glucose, capillary     Status: Abnormal   Collection Time: 01/04/20  8:17 PM  Result Value Ref Range   Glucose-Capillary 116 (H) 70 - 99 mg/dL  Glucose, capillary     Status: Abnormal   Collection Time: 01/05/20 12:18 AM  Result Value Ref Range   Glucose-Capillary 131 (H) 70 - 99 mg/dL   Comment 1 Notify RN   Hepatic function panel     Status: Abnormal   Collection Time: 01/05/20  3:01 AM  Result Value Ref Range   Total Protein 7.0 6.5 - 8.1 g/dL   Albumin 1.3 (L) 3.5 - 5.0 g/dL   AST 17 15 - 41 U/L   ALT 21 0 - 44 U/L   Alkaline Phosphatase 188 (H) 38 - 126 U/L   Total Bilirubin 0.7 0.3 - 1.2 mg/dL   Bilirubin, Direct 0.1 0.0 - 0.2 mg/dL   Indirect Bilirubin 0.6 0.3 - 0.9 mg/dL    Comment: Performed at [facility], [address] [place], [place] [address]  CBC with Differential/Platelet     Status: Abnormal   Collection Time: 01/05/20  3:01 AM  Result Value Ref Range   WBC 6.7  4.0 - 10.5 K/uL   RBC 2.51 (L) 3.87 - 5.11 MIL/uL   Hemoglobin 7.3 (L) 12.0 - 15.0 g/dL   HCT 25.9 (L) 56.3 - 87.5 %   MCV 97.2 80.0 - 100.0 fL   MCH 29.1 26.0 - 34.0 pg   MCHC 29.9 (L) 30.0 - 36.0 g/dL   RDW 64.3 (H) 32.9 - 51.8 %   Platelets 238 150 - 400 K/uL   nRBC 0.3 (H) 0.0 - 0.2 %   Neutrophils Relative % 54 %   Neutro Abs 3.6 1.7 - 7.7 K/uL   Lymphocytes Relative 22 %   Lymphs Abs 1.5 0.7 - 4.0 K/uL   Monocytes Relative 6 %   Monocytes Absolute 0.4 0.1 - 1.0 K/uL   Eosinophils Relative 7 %   Eosinophils Absolute 0.5 0.0 - 0.5 K/uL   Basophils Relative 1 %   Basophils Absolute 0.1 0.0 - 0.1 K/uL   Smear Review MORPHOLOGY UNREMARKABLE    Immature Granulocytes 10 %   Abs Immature Granulocytes 0.66 (H) 0.00 - 0.07 K/uL    Comment: Performed at [facility], [address],  [place], [place] [address]  Basic metabolic panel     Status: Abnormal   Collection Time: 01/05/20  3:01 AM  Result Value Ref Range   Sodium 141 135 - 145 mmol/L   Potassium 4.7 3.5 - 5.1 mmol/L   Chloride 103 98 - 111 mmol/L   CO2 32 22 - 32 mmol/L   Glucose, Bld 133 (H) 70 - 99 mg/dL   BUN 97 (H) 8 - 23 mg/dL   Creatinine, Ser 0.63 0.44 - 1.00 mg/dL   Calcium 8.7 (L) 8.9 - 10.3 mg/dL   GFR calc non Af Amer >60 >60 mL/min   GFR calc Af Amer >60 >60 mL/min   Anion gap 6 5 - 15    Comment: Performed at [facility], [address] [place], [place] [address]  Magnesium     Status: None   Collection Time: 01/05/20  3:01 AM  Result Value Ref Range   Magnesium 2.2 1.7 - 2.4 mg/dL    Comment: Performed at [facility], [address] [place], [place] [address]  Glucose, capillary     Status: Abnormal   Collection Time: 01/05/20  4:44 AM  Result Value Ref Range   Glucose-Capillary 112 (H) 70 - 99 mg/dL   Comment 1 Notify RN   Glucose, capillary     Status: None   Collection Time: 01/05/20  7:57 AM  Result Value Ref Range   Glucose-Capillary 77 70 - 99 mg/dL  Glucose, capillary     Status: Abnormal   Collection Time: 01/05/20 11:44 AM  Result Value Ref Range   Glucose-Capillary 110 (H) 70 - 99 mg/dL  Glucose, capillary     Status: Abnormal   Collection Time: 01/05/20  3:38 PM  Result Value Ref Range   Glucose-Capillary 127 (H) 70 - 99 mg/dL  Glucose, capillary     Status: Abnormal   Collection Time: 01/06/20 12:22 AM  Result Value Ref Range   Glucose-Capillary 115 (H) 70 - 99 mg/dL  CBC with Differential/Platelet     Status: Abnormal   Collection Time: 01/06/20  3:50 AM  Result Value Ref Range   WBC 7.9 4.0 - 10.5 K/uL   RBC 2.60 (L) 3.87 - 5.11 MIL/uL   Hemoglobin 7.5 (L) 12.0 - 15.0 g/dL   HCT 55.7 (L) 32.2 - 02.5 %  MCV 96.5 80.0 - 100.0 fL   MCH 28.8 26.0 - 34.0 pg   MCHC 29.9 (L) 30.0 - 36.0 g/dL   RDW 68.0 (H) 88.1 - 10.3 %   Platelets 219 150 - 400 K/uL    nRBC 0.3 (H) 0.0 - 0.2 %   Neutrophils Relative % 64 %   Neutro Abs 5.0 1.7 - 7.7 K/uL   Lymphocytes Relative 16 %   Lymphs Abs 1.3 0.7 - 4.0 K/uL   Monocytes Relative 4 %   Monocytes Absolute 0.3 0.1 - 1.0 K/uL   Eosinophils Relative 8 %   Eosinophils Absolute 0.7 (H) 0.0 - 0.5 K/uL   Basophils Relative 1 %   Basophils Absolute 0.1 0.0 - 0.1 K/uL   WBC Morphology MILD LEFT SHIFT (1-5% METAS, OCC MYELO, OCC BANDS)     Comment: TOXIC GRANULATION VACUOLATED NEUTROPHILS    Immature Granulocytes 7 %   Abs Immature Granulocytes 0.52 (H) 0.00 - 0.07 K/uL   Polychromasia PRESENT     Comment: Performed at [facility], [address] [place], [place] [address]  Basic metabolic panel     Status: Abnormal   Collection Time: 01/06/20  3:50 AM  Result Value Ref Range   Sodium 140 135 - 145 mmol/L   Potassium 4.9 3.5 - 5.1 mmol/L   Chloride 103 98 - 111 mmol/L   CO2 32 22 - 32 mmol/L   Glucose, Bld 72 70 - 99 mg/dL   BUN 94 (H) 8 - 23 mg/dL   Creatinine, Ser 8.59 0.44 - 1.00 mg/dL   Calcium 8.7 (L) 8.9 - 10.3 mg/dL   GFR calc non Af Amer >60 >60 mL/min   GFR calc Af Amer >60 >60 mL/min   Anion gap 5 5 - 15    Comment: Performed at [facility], [address], [place], [place] [address]  Magnesium     Status: None   Collection Time: 01/06/20  3:50 AM  Result Value Ref Range   Magnesium 2.1 1.7 - 2.4 mg/dL    Comment: Performed at [facility], [address] [place], [place] [address]  Glucose, capillary     Status: None   Collection Time: 01/06/20  4:15 AM  Result Value Ref Range   Glucose-Capillary 76 70 - 99 mg/dL   Comment 1 Notify RN   Glucose, capillary     Status: Abnormal   Collection Time: 01/06/20  8:23 AM  Result Value Ref Range   Glucose-Capillary 106 (H) 70 - 99 mg/dL    [place] RT UPPER EXTREM LTD SOFT TISSUE NON VASCULAR  Result Date: 01/05/2020 CLINICAL DATA:  Right upper extremity swelling, evaluate for abscess EXAM: ULTRASOUND RIGHT UPPER  EXTREMITY LIMITED TECHNIQUE: Ultrasound examination of the upper extremity soft tissues was performed in the area of clinical concern. COMPARISON:  None. FINDINGS: Within the distal aspect of the right forearm, there is marked soft tissue swelling and subcutaneous edema. At the area of clinical concern, there is a more focal area of ill-defined complex fluid within the subcutaneous tissues. No thickened, well-defined wall. No sonographic evidence of soft tissue gas. IMPRESSION: Extensive soft tissue swelling and edema at the right distal forearm with ill-defined fluid at the site of clinical concern suggestive of phlegmonous change/developing abscess. No well-defined wall to suggest a drainable component at this time. Electronically Signed   By: Duanne Guess D.O.   On: 01/05/2020 13:28   VAS [place] UPPER EXTREMITY VENOUS DUPLEX  Result Date: 01/05/2020 UPPER VENOUS STUDY  Indications: Edema Limitations: Poor ultrasound/tissue interface, bandages, line and depth of vessels, patient's pain with touch to right arm. Comparison Study: Prior right upper extremity venous duplex done 12/25/19 Performing Technologist: Sherren Kernsandace Kanady RVS  Examination Guidelines: A complete evaluation includes B-mode imaging, spectral Doppler, color Doppler, and power Doppler as needed of all accessible portions of each vessel. Bilateral testing is considered an integral part of a complete examination. Limited examinations for reoccurring indications may be performed as noted.  Right Findings: +----------+------------+---------+-----------+----------+---------------------+ RIGHT     CompressiblePhasicitySpontaneousProperties       Summary        +----------+------------+---------+-----------+----------+---------------------+ IJV           Full       Yes       Yes                                    +----------+------------+---------+-----------+----------+---------------------+ Subclavian               Yes       Yes                                     +----------+------------+---------+-----------+----------+---------------------+ Axillary                 Yes       Yes                                    +----------+------------+---------+-----------+----------+---------------------+ Brachial                                            only able to discern                                                       one of the brachial                                                              veins         +----------+------------+---------+-----------+----------+---------------------+ Radial                                                 patent by color    +----------+------------+---------+-----------+----------+---------------------+ Ulnar                                                  Not visualized     +----------+------------+---------+-----------+----------+---------------------+ Cephalic      Full                                                        +----------+------------+---------+-----------+----------+---------------------+  Basilic       Full                                                        +----------+------------+---------+-----------+----------+---------------------+  Left Findings: +----------+------------+---------+-----------+----------+---------------------+ LEFT      CompressiblePhasicitySpontaneousProperties       Summary        +----------+------------+---------+-----------+----------+---------------------+ IJV           Full       Yes       Yes                                    +----------+------------+---------+-----------+----------+---------------------+ Subclavian               Yes       Yes                                    +----------+------------+---------+-----------+----------+---------------------+ Axillary                 Yes       Yes                                     +----------+------------+---------+-----------+----------+---------------------+ Brachial    Partial      No        No                 DVT noted in the                                                         brachial vein with                                                             PICC line       +----------+------------+---------+-----------+----------+---------------------+ Radial                                                 patent by color    +----------+------------+---------+-----------+----------+---------------------+ Ulnar                                                  patent by color    +----------+------------+---------+-----------+----------+---------------------+ Cephalic      Full                                                        +----------+------------+---------+-----------+----------+---------------------+  Basilic       Full                                                        +----------+------------+---------+-----------+----------+---------------------+  Summary:  Right: No evidence of deep vein thrombosis in the upper extremity. No evidence of superficial vein thrombosis in the upper extremity. However, unable to visualize one of the Brachial veins.  Left: No evidence of superficial vein thrombosis in the upper extremity. Findings consistent with age indeterminate deep vein thrombosis involving the left brachial vein containing PICC line.  *See table(s) above for measurements and observations.  Diagnosing physician: Monica Martinez MD Electronically signed by Monica Martinez MD on 01/05/2020 at 4:02:28 PM.    Final     Review of Systems  Unable to perform ROS: Patient nonverbal   Blood pressure (!) 93/42, pulse 68, temperature (!) 96.4 F (35.8 C), temperature source Axillary, resp. rate 15, height 5\' 10"  (1.778 m), weight 98.4 kg, SpO2 97 %. Physical Exam  Constitutional: She appears well-developed and well-nourished.  No distress.  HENT:  Head: Normocephalic and atraumatic.  Eyes: Conjunctivae are normal. Right eye exhibits no discharge. Left eye exhibits no discharge. No scleral icterus.  Cardiovascular: Normal rate and regular rhythm.  Respiratory: Effort normal. No respiratory distress.  Musculoskeletal:     Cervical back: Normal range of motion.     Comments: Right shoulder, elbow, wrist, digits- Punctate wound dorsal distal FA, mild purulent discharge, no fluctuance or significant induration noted, nontender, no instability, no blocks to motion  Sens  Ax/R/M/U absent  Mot   Ax/ R/ PIN/ M/ AIN/ U absent  Rad 2+  Neurological: She is alert.  Skin: Skin is warm and dry. She is not diaphoretic.  Psychiatric: She has a normal mood and affect. Her behavior is normal.    Assessment/Plan: Right FA infection-- With no defined abscess and ability for area to drain would not advocate any surgical intervention at this time. Would continue IV abx and consider warm compresses to area though much care will need to be taken considering her impaired sensation. Multiple medical problems including anxiety/depression, PTSD, MRSA epidural abscess, quadriplegia, diabetes mellitus type 2, chronic kidney disease stage III, COPD, chronic hypoxic respiratory failure on chronic trach/vent dependence, chronic dysphagia status post PEG tube, GERD, anemia -- per primary service    Lisette Abu, PA-C Orthopedic Surgery [phone] 01/06/2020, 11:49 AM   Right forearm mild infection surrounding previous PIV site.  General surgery was consulted, and redirected consulting physician to call hand surgery.  There is no significant expressible purulence at this time, minimal surrounding erythema, and the IV site is open presently to allow drainage.  No indications at this time for surgical drainage requiring general anesthesia.  If fails to improve with antibiotics over the next 24 to 48 hours, ICU team could perform augmented  drainage procedure under local anesthetic, using spreading dissection with a hemostat.  Please reconsult if indications for care requiring the operating room develop.

## 2020-01-06 NOTE — Progress Notes (Signed)
Occupational Therapy Treatment Patient Details Name: Gabrielle Wade MRN: [id number] DOB: Sep 22, 1956 Today's Date: 01/06/2020    History of present illness Gabrielle Wade  is a 64 y.o. female, w anxiety/ depression,PTSD,  hx of epidural abscess (mrsa), w quadriplegia , Dm2, CKD stage3,  Copd, Chronic Trach/ vent dependence, Gerd, chronic dysphagia, Anemia, presents with ? Thrombocytopenia, Hgb 6.8, and wbc 0.6 per [facility].    OT comments  This 64 yo female admitted with above presents to acute OT today only limited to trying chair position due to increased time to roll with pt to get peri area cleaned up and then with drop in BP when in chair position but tolerating. She is very anxious with any mobility. Pt will continue to benefit from acute OT with follow up at [facility].  Follow Up Recommendations  LTACH;Supervision/Assistance - 24 hour    Equipment Recommendations  Other (comment)(TBD next venue)       Precautions / Restrictions Precautions Precautions: Fall Precaution Comments: very edematous, placed flexiseal Restrictions Weight Bearing Restrictions: No       Mobility Bed Mobility Overal bed mobility: Needs Assistance Bed Mobility: Rolling Rolling: Total assist;+2 for physical assistance         General bed mobility comments: pt soiled with stool. totalAx2 to roll L/R. Pt grimacing in pain and asking to stop however hygiene had to be completed as pt with a lot of stool, pt's bottom with skin break down and bleeding  Transfers                 General transfer comment: NT    Balance Overall balance assessment: (pt dependent for all as she is a quadraplegic)                                         ADL either performed or assessed with clinical judgement   ADL Overall ADL's : Needs assistance/impaired                                       General ADL Comments: continues to be total assist for all self care     Vision    Vision Assessment?: No apparent visual deficits          Cognition Arousal/Alertness: Awake/alert Behavior During Therapy: Anxious Overall Cognitive Status: Difficult to assess                                 General Comments: pt very anxious stating "I can't breath, I'm going to throw up" Pt's sats remained stable.        Exercises Other Exercises Other Exercises: Pt's Bil UEs very edematous (R>L) arms propped in manner to help with edema control at end of session (they were propped when we entered room as well)      General Comments pt bleeding from skin breakdown on bottom, pt edematous t/o body, pt also with extremely dry and flaky skin around neck    Pertinent Vitals/ Pain       Pain Assessment: Faces Faces Pain Scale: Hurts even more Pain Location: generalized--while rolled on side to clean bottome Pain Descriptors / Indicators: Discomfort Pain Intervention(s): Monitored during session;Repositioned         Frequency  Min 1X/week  Progress Toward Goals   Progress towards OT goals: Progressing toward goals(bed in chair position)        Co-evaluation    PT/OT/SLP Co-Evaluation/Treatment: Yes Reason for Co-Treatment: Complexity of the patient's impairments (multi-system involvement) PT goals addressed during session: Mobility/safety with mobility OT goals addressed during session: (mobility)      AM-PAC OT "6 Clicks" Daily Activity     Outcome Measure   Help from another person eating meals?: Total Help from another person taking care of personal grooming?: Total Help from another person toileting, which includes using toliet, bedpan, or urinal?: Total Help from another person bathing (including washing, rinsing, drying)?: Total Help from another person to put on and taking off regular upper body clothing?: Total Help from another person to put on and taking off regular lower body clothing?: Total 6 Click Score: 6    End of  Session    OT Visit Diagnosis: Other abnormalities of gait and mobility (R26.89);Muscle weakness (generalized) (M62.81);Other symptoms and signs involving the nervous system (R29.898)   Activity Tolerance (unable to get OOB this date due to need to get pt cleaned up and then when placed in chair position in bed pt with signifcant drop in BP (see PT note))   Patient Left in bed   Nurse Communication (try to alternate pt in chair and bed position throughout day)        Time: 3086-5784 OT Time Calculation (min): 37 min  Charges: OT General Charges $OT Visit: 1 Visit OT Treatments $Therapeutic Activity: 8-22 mins  Cathy  OTR/L Acute Rehab Services Pager [phone] Office [phone]      01/06/2020, 10:14 AM

## 2020-01-06 NOTE — Procedures (Signed)
Interventional Radiology Procedure:   Indications: Bacteremia, needs long term IV access.  Procedure: Tunneled central line placement  Findings: Right jugular Powerline catheter placement, tip at SVC/RA junction.  Complications: None     EBL: Less than 20 ml  Plan: Central line is ready to use.  Right neck sutures can be removed in 2 weeks.    Algernon Mundie R. Lowella Dandy, MD  Pager: [phone]

## 2020-01-06 NOTE — Progress Notes (Addendum)
NAME:  Gabrielle Wade, MRN:  [id number], DOB:  [birth year], LOS: 14 ADMISSION DATE:  12/23/2019, CONSULTATION DATE:  12/22/2018 REFERRING MD:  Dr. [name], CHIEF COMPLAINT:  Chronic vent management  Brief History    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 She is ventilator depended due to respiratory failure and was transferred from [facility] for evaluation of her leukopenia and anemia. PCCM was consulted by [facility] for chronic tracheostomy and ventilator management.   History of present illness   HPI obtained from medical chart review as patient is trach/ mechanical ventilator dependent.  No bedside medical records found, therefore limited HPI.   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  In ER, hemodynamically stable and initial temp 95.5.  Patient is awake in no distress on ventilator with settings PRVC, rate 12, FiO2 30%, TV 480, PEEP 5.  Unknown if patient weans.  Workup notable for WBC 0.8, Hgb 6.2, platelets 255, glucose 177, BUN 69, sCr 1.24, alk phos 247, albumin 1.2, corrected calcium 10.8, trop hs 5, lactic acid 1, normal coags, TSH 24.57, free T4 0.66, FOB negative, SARS2 PCR neg, UA negative.  CXR showing cardiomegaly with moderate bilateral pleural effusions, bilateral airspace and bibasilar opacities concerning for developing edema vs atypical infectious process vs atelectasis vs developing infiltrate.  TRH to admit, PCCM consulted for vent and tracheostomy management.   Past Medical History  Cervical epidural abscess- MRSA in 08/2019 with resultant quadriplegia with tracheostomy and ventilator dependent respiratory failure,  PEG, DMT2, CKD stage III, COPD, GERD, anxiety/ depression, PTSD  Significant Hospital Events   1/4 Admit [facility]  Consults:  PCCM - trach / vent   Procedures:  pta trach pta PEG   Significant Diagnostic Tests:  12/24/19 Complete Echocardiogram:  -LVEF 55-60% - Grade II Diastolic Dysfunction Micro Data:  1/4 SARS 2/ Flu A/B >> neg 1/4 UC >> >100K cfus of E coli and klebsiella pneumonia 1/4 BCx 2 >> preliminary staph species methicillin-resistant 1/4 urine strep >> (-) 1/4 urine legionella >> (-)  Antimicrobials:  1/4 cefepime >> 1/6 1/4 vanc >>1/14 1/6 meropenem >> 1/11 1/9 Diflucan>>>1/22 1/10 Rifampin>>1/14  Interim history/subjective:  O/N Events: None Objective   Blood pressure (!) 92/46, pulse 68, temperature (!) 94.5 F (34.7 C), temperature source Oral, resp. rate 13, height 5' 10"  (1.778 m), weight 98.4 kg, SpO2 98 %.    Vent Mode: PRVC FiO2 (%):  [30 %] 30 % Set Rate:  [12 bmp] 12 bmp Vt Set:  [480 mL] 480 mL PEEP:  [5 cmH20] 5 cmH20 Plateau Pressure:  [15 cmH20-23 cmH20] 18 cmH20   Intake/Output Summary (Last 24 hours) at 01/06/2020 0711 Last data filed at 01/06/2020 0500 Gross per 24 hour  Intake 1499.86 ml  Output 1820 ml  Net -320.14 ml    Examination: General: Laying comfortably, no acute distress.   HEENT: normocephalic, atraumatic, trach intact, no scleral icterus noted.  Cardio: RRR no murmurs, rubs, or gallops Pulmonary: Clear to auscultation bilaterally, no rales, wheezes, or rhonchi appreciated.  Abdomen: BS present, non distended, non tender to palpation MSK: Upper extremities swollen and erythematous bilaterally. Bandage over the right forearm no pitting edema noted bilaterally in the lower extremeties  Neuro: able to answer questions appropriately with nodding and shaking of her head Skin: warm and dry.   Resolved Hospital Problem list     Assessment & Plan:   Chronic Respiratory Failure with Tracheostomy and PEG 2/2 to Cervical Epidural  Abscess Resulting in Quadriplegia Requiring Ventilation Support:   - Full vent support, no weaning - Current vent settings 30/5/12/480 - Titrate O2 for sat of 88-92% - Maintain current trach size and type  HCAP with Sepsis (Resolved):  - Patient Hemodynamically Stable - Blood Cultures with MRSA - Urine Cultures + E. Coli and Klebsiella - On Vancomycin and Rifampin  - Completed meropenem for e. Coli.  - Continue to follow with Primary   Candidemia: - Continue on Diflucan until 01/10/20  Neutropenia (Resolved):  - Likely 2/2 to sepsis from bacteremia  - WBC 7.9   Caloric Malnutrition:  -Continue PEG tube feeds   Hypernatremia (Resolved):  - Na:140   Acute Kidney Injury (Resolved):  - Cr: 0.79  Best practice:  Diet: NPO, PEG tube feeds resumed Pain/Anxiety/Delirium protocol (if indicated): not needed VAP protocol (if indicated): yes DVT prophylaxis: lovenox GI prophylaxis: PPI Glucose control: CBG q 4, SSI Mobility: PT/ OT Code Status: full  Family Communication: per primary, patient updated on plan of care Disposition: admit [facility]/ PCCM for chronic vent managment  Labs   CBC: Recent Labs  Lab 12/31/19 0426 12/31/19 0426 12/31/19 1037 01/02/20 0708 01/03/20 0531 01/05/20 0301 01/06/20 0350  WBC 2.9*  --   --  4.2 4.3 6.7 7.9  NEUTROABS  --   --   --  1.8 2.6 3.6 5.0  HGB 7.6*   < > 8.5* 7.7* 7.7* 7.3* 7.5*  HCT 26.7*   < > 25.0* 26.9* 26.4* 24.4* 25.1*  MCV 101.9*  --   --  100.7* 99.2 97.2 96.5  PLT 164  --   --  196 199 238 219   < > = values in this interval not displayed.    Basic Metabolic Panel: Recent Labs  Lab 01/01/20 0524 01/02/20 0708 01/03/20 0531 01/05/20 0301 01/06/20 0350  NA 147* 149* 144 141 140  K 4.1 4.4 4.6 4.7 4.9  CL 108 110 105 103 103  CO2 33* 33* 32 32 32  GLUCOSE 149* 146* 105* 133* 72  BUN 101* 98* 98* 97* 94*  CREATININE 0.85 0.81 0.73 0.83 0.79  CALCIUM 9.3 9.1 9.0 8.7* 8.7*  MG  --  2.1 2.0 2.2 2.1    GFR: Estimated Creatinine Clearance: 89.1 mL/min (by C-G formula based on SCr of 0.79 mg/dL). Recent Labs  Lab 01/02/20 0708 01/03/20 0531 01/05/20 0301 01/06/20 0350  WBC 4.2 4.3 6.7 7.9    Liver Function Tests: Recent Labs  Lab 12/31/19 0426 01/01/20 0524 01/03/20 0531 01/05/20 0301  AST 16 16 16 17   ALT 25 22 19 21   ALKPHOS 184* 170* 169* 188*  BILITOT 0.6 0.6 0.5 0.7  PROT 7.0 7.2 6.9 7.0  ALBUMIN 1.2* 1.3* 1.3* 1.3*   No results for input(s): LIPASE, AMYLASE in the last 168 hours. No results for input(s): AMMONIA in the last 168 hours.  ABG    Component Value Date/Time   PHART 7.420 12/31/2019 1037   PCO2ART 53.5 (H) 12/31/2019 1037   PO2ART 43.0 (L) 12/31/2019 1037   HCO3 34.9 (H) 12/31/2019 1037   TCO2 37 (H) 12/31/2019 1037   O2SAT 80.0 12/31/2019 1037     Coagulation Profile: No results for input(s): INR, PROTIME in the  last 168 hours.  Cardiac Enzymes: No results for input(s): CKTOTAL, CKMB, CKMBINDEX, TROPONINI in the last 168 hours.  HbA1C: No results found for: HGBA1C  CBG: Recent Labs  Lab 01/05/20 0757 01/05/20 1144 01/05/20 1538 01/06/20 0022 01/06/20 0415  GLUCAP 77 110* 127* 115* 76   [name], MD IMTS, PGY-1 Pager: [phone] Please see Attending Note for Final Recommendations.

## 2020-01-06 NOTE — Sedation Documentation (Signed)
Patient rested comfortable during procedure. Vital signs stable.

## 2020-01-07 DIAGNOSIS — R600 Localized edema: Secondary | ICD-10-CM

## 2020-01-07 LAB — CBC WITH DIFFERENTIAL/PLATELET
Abs Immature Granulocytes: 0.49 10*3/uL — ABNORMAL HIGH (ref 0.00–0.07)
Basophils Absolute: 0.1 10*3/uL (ref 0.0–0.1)
Basophils Relative: 1 %
Eosinophils Absolute: 0.5 10*3/uL (ref 0.0–0.5)
Eosinophils Relative: 7 %
HCT: 23.5 % — ABNORMAL LOW (ref 36.0–46.0)
Hemoglobin: 7.2 g/dL — ABNORMAL LOW (ref 12.0–15.0)
Immature Granulocytes: 6 %
Lymphocytes Relative: 14 %
Lymphs Abs: 1.1 10*3/uL (ref 0.7–4.0)
MCH: 29.4 pg (ref 26.0–34.0)
MCHC: 30.6 g/dL (ref 30.0–36.0)
MCV: 95.9 fL (ref 80.0–100.0)
Monocytes Absolute: 0.3 10*3/uL (ref 0.1–1.0)
Monocytes Relative: 4 %
Neutro Abs: 5.1 10*3/uL (ref 1.7–7.7)
Neutrophils Relative %: 68 %
Platelets: 221 10*3/uL (ref 150–400)
RBC: 2.45 MIL/uL — ABNORMAL LOW (ref 3.87–5.11)
RDW: 17.4 % — ABNORMAL HIGH (ref 11.5–15.5)
WBC: 7.6 10*3/uL (ref 4.0–10.5)
nRBC: 0.3 % — ABNORMAL HIGH (ref 0.0–0.2)

## 2020-01-07 LAB — GLUCOSE, CAPILLARY
Glucose-Capillary: 113 mg/dL — ABNORMAL HIGH (ref 70–99)
Glucose-Capillary: 57 mg/dL — ABNORMAL LOW (ref 70–99)
Glucose-Capillary: 112 mg/dL — ABNORMAL HIGH (ref 70–99)
Glucose-Capillary: 102 mg/dL — ABNORMAL HIGH (ref 70–99)

## 2020-01-07 LAB — BASIC METABOLIC PANEL
Anion gap: 5 (ref 5–15)
BUN: 95 mg/dL — ABNORMAL HIGH (ref 8–23)
CO2: 30 mmol/L (ref 22–32)
Calcium: 8.6 mg/dL — ABNORMAL LOW (ref 8.9–10.3)
Chloride: 100 mmol/L (ref 98–111)
Creatinine, Ser: 0.82 mg/dL (ref 0.44–1.00)
GFR calc Af Amer: >60 mL/min (ref >60)
GFR calc non Af Amer: >60 mL/min (ref >60)
Glucose, Bld: 124 mg/dL — ABNORMAL HIGH (ref 70–99)
Potassium: 4.9 mmol/L (ref 3.5–5.1)
Sodium: 135 mmol/L (ref 135–145)

## 2020-01-07 LAB — HEPATIC FUNCTION PANEL
ALT: 19 U/L (ref 0–44)
AST: 14 U/L — ABNORMAL LOW (ref 15–41)
Albumin: 1.3 g/dL — ABNORMAL LOW (ref 3.5–5.0)
Alkaline Phosphatase: 160 U/L — ABNORMAL HIGH (ref 38–126)
Bilirubin, Direct: <0.1 mg/dL (ref 0.0–0.2)
Total Bilirubin: 0.5 mg/dL (ref 0.3–1.2)
Total Protein: 6.6 g/dL (ref 6.5–8.1)

## 2020-01-07 LAB — MAGNESIUM: Magnesium: 2.1 mg/dL (ref 1.7–2.4)

## 2020-01-07 MED ORDER — APIXABAN 5 MG PO TABS: ORAL_TABLET | ORAL | 0 refills | Status: AC

## 2020-01-07 MED ORDER — FREE WATER: 300.0000 mL | 0 refills | Status: AC

## 2020-01-07 MED ORDER — DEXTROSE 50 % IV SOLN
INTRAVENOUS | Status: AC
  Administered 2020-01-07: 50 mL
  Filled 2020-01-07: qty 50

## 2020-01-07 MED ORDER — DEXTROSE 50 % IV SOLN: 25.0000 mL | Freq: Once | INTRAVENOUS | Status: AC

## 2020-01-07 MED ORDER — TRIAMCINOLONE 0.1 % CREAM:EUCERIN CREAM 1:1: 1.0000 "application " | TOPICAL_CREAM | Freq: Two times a day (BID) | CUTANEOUS | 0 refills | Status: AC

## 2020-01-07 NOTE — TOC Transition Note (Signed)
Transition of Care [facility]) - CM/SW Discharge Note   Patient Details  Name: Gabrielle Wade MRN: [id number] Date of Birth: Mar 21, 1956  Transition of Care [facility]) CM/SW Contact:  Inis Sizer, LCSW Phone Number: 01/07/2020, 8:27 AM   Clinical Narrative:     Patient will discharge to [facility]. CSW arranged for pick up for first available. Patient will go to room C303. The number to call for report is [phone].  CSW notified RN of discharge plan. CSW notified patient's husband Darcel Bayley of plan as well.  Final next level of care: Skilled Nursing Facility Barriers to Discharge: No Barriers Identified   Patient Goals and CMS Choice        Discharge Placement              Patient chooses bed at: [facility]  [facility] Patient to be transferred to facility by: [facility] Name of family member notified: Darcel Bayley, spouse at [phone] Patient and family notified of of transfer: 01/07/20  Discharge Plan and Services                                     Social Determinants of Health (SDOH) Interventions     Readmission Risk Interventions No flowsheet data found.

## 2020-01-07 NOTE — Progress Notes (Signed)
Report called to Estil Daft, LPN. Attempted to get in touch with husband but unable to leave voicemail. I was able to touch base with her daughter.

## 2020-01-07 NOTE — NC FL2 (Signed)
[place] LEVEL OF CARE SCREENING TOOL     IDENTIFICATION  Patient Name: Gabrielle Wade Birthdate: November 21, 1956 Sex: female Admission Date (Current Location): 12/23/2019  [place] and [place] Number:  Gabrielle Wade [id number] [place] and Address:  [facility] [place] [address] [place],  [address]      Provider Number:    Attending Physician Name and Address:  Aline August, MD  Relative Name and Phone Number:  Hollice Espy [phone]    Current Level of Care: Hospital Recommended Level of Care: [place] Prior Approval Number:    Date Approved/Denied:   PASRR Number: [id number]  Discharge Plan: SNF    Current Diagnoses: Patient Active Problem List   Diagnosis Date Noted  . Bacteremia   . Quadriplegia and quadriparesis ([place])   . Palliative care encounter   . Chronic respiratory failure ([place])   . Candidemia ([place]) 12/27/2019  . 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 MRSA bacteremia 12/24/2019  . E coli bacteremia 12/24/2019  . Pressure ulcer of left heel, stage 3 ([place]) 12/24/2019  . Edema of right upper arm 12/24/2019  . Anemia 12/23/2019  . Neutropenia ([place]) 12/23/2019  . Quadriplegia ([place]) 12/23/2019  . Diabetes ([place]) 12/23/2019    Orientation RESPIRATION BLADDER Height & Weight     Self, Time, Situation, Place  Vent Incontinent Weight: 193 lb 12.6 oz (87.9 kg) Height:  _0  (177.8 cm)  BEHAVIORAL SYMPTOMS/MOOD NEUROLOGICAL BOWEL NUTRITION STATUS      Incontinent Feeding tube  AMBULATORY STATUS COMMUNICATION OF NEEDS Skin   Total Care   PU Stage and Appropriate Care     PU Stage 3 Dressing: Daily                 Personal Care Assistance Level of Assistance  Bathing, Dressing, Feeding Bathing Assistance: Maximum assistance Feeding assistance: Maximum assistance Dressing Assistance: Maximum assistance     Functional Limitations Info  Speech, Sight, Hearing Sight Info: Adequate Hearing Info:  Adequate Speech Info: Impaired    SPECIAL CARE FACTORS FREQUENCY                       Contractures Contractures Info: Not present    Additional Factors Info  Allergies, Code Status Code Status Info: Full Code Allergies Info: Codeine, hydrocodone, latex, influenza vaccine, moriphine, penicillin, shellfish, tape           Current Medications (01/07/2020):  This is the current hospital active medication list Current Facility-Administered Medications  Medication Dose Route Frequency Provider Last Rate Last Admin  . 0.9 %  sodium chloride infusion (Manually program via Guardrails IV Fluids)   Intravenous Once Gareth Morgan, MD   Stopped at 12/23/19 2340  . 0.9 %  sodium chloride infusion   Intravenous PRN Swayze, Ava, DO   Stopped at 01/02/20 0755  . acetaminophen (TYLENOL) tablet 650 mg  650 mg Oral Q6H PRN Jani Gravel, MD   650 mg at 01/03/20 0032   Or  . acetaminophen (TYLENOL) suppository 650 mg  650 mg Rectal Q6H PRN Jani Gravel, MD      . apixaban Arne Cleveland) tablet 10 mg  10 mg Oral BID Romona Curls, RPH   10 mg at 01/07/20 7026   Followed by  . [START ON 01/12/2020] apixaban (ELIQUIS) tablet 5 mg  5 mg Oral BID Romona Curls, [facility]      . chlorhexidine gluconate (MEDLINE KIT) (PERIDEX) 0.12 % solution 15 mL  15 mL Mouth Rinse  BID Jennelle Human B, NP   15 mL at 01/07/20 0819  . Chlorhexidine Gluconate Cloth 2 % PADS 6 each  6 each Topical Daily Rush Farmer, MD   6 each at 01/07/20 0112  . feeding supplement (JEVITY 1.5 CAL/FIBER) liquid 1,000 mL  1,000 mL Per Tube Continuous Swayze, Ava, DO 50 mL/hr at 01/04/20 1629 1,000 mL at 01/04/20 1629  . feeding supplement (PRO-STAT SUGAR FREE 64) liquid 30 mL  30 mL Per Tube TID Swayze, Ava, DO   30 mL at 01/07/20 0957  . ferrous sulfate 300 (60 Fe) MG/5ML syrup 300 mg  300 mg Per Tube Q breakfast Swayze, Ava, DO   300 mg at 01/07/20 0758  . fluconazole (DIFLUCAN) IVPB 400 mg  400 mg Intravenous Q24H Susa Raring, [place]    Stopped at 01/07/20 9373  . free water 300 mL  300 mL Per Tube Q4H Alekh, Kshitiz, MD   300 mL at 01/07/20 1158  . Gerhardt's butt cream 1 application  1 application Topical QID Swayze, Ava, DO   1 application at 42/87/68 1406  . insulin aspart (novoLOG) injection 0-15 Units  0-15 Units Subcutaneous Q4H Swayze, Ava, DO   2 Units at 01/06/20 2002  . insulin aspart (novoLOG) injection 5 Units  5 Units Subcutaneous Q4H Swayze, Ava, DO   5 Units at 01/07/20 1156  . insulin glargine (LANTUS) injection 6 Units  6 Units Subcutaneous Daily Swayze, Ava, DO   6 Units at 01/07/20 0958  . ipratropium-albuterol (DUONEB) 0.5-2.5 (3) MG/3ML nebulizer solution 3 mL  3 mL Nebulization Q4H PRN Rush Farmer, MD   3 mL at 01/01/20 0404  . ipratropium-albuterol (DUONEB) 0.5-2.5 (3) MG/3ML nebulizer solution 3 mL  3 mL Nebulization TID Rush Farmer, MD   3 mL at 01/07/20 0748  . levothyroxine (SYNTHROID) tablet 25 mcg  25 mcg Oral Q0600 Swayze, Ava, DO   25 mcg at 01/07/20 0600  . lidocaine (XYLOCAINE) 1 % (with pres) injection   Infiltration PRN Markus Daft, MD   5 mL at 01/06/20 1351  . LORazepam (ATIVAN) 2 MG/ML concentrated solution 1 mg  1 mg Per Tube Q4H PRN Swayze, Ava, DO   1 mg at 01/07/20 0958  . MEDLINE mouth rinse  15 mL Mouth Rinse 10 times per day Jennelle Human B, NP   15 mL at 01/07/20 1407  . nutrition supplement (JUVEN) (JUVEN) powder packet 1 packet  1 packet Per Tube BID Swayze, Ava, DO   1 packet at 01/07/20 0957  . oxyCODONE (Oxy IR/ROXICODONE) immediate release tablet 5 mg  5 mg Oral Q4H PRN Stretch, Marily Lente, MD   5 mg at 01/07/20 0958  . pantoprazole sodium (PROTONIX) 40 mg/20 mL oral suspension 40 mg  40 mg Per Tube Q1200 Jennelle Human B, NP   40 mg at 01/07/20 1154  . rifabutin (MYCOBUTIN) capsule 300 mg  300 mg Oral Daily Thayer Headings, MD   300 mg at 01/07/20 0958  . sodium chloride flush (NS) 0.9 % injection 10-40 mL  10-40 mL Intracatheter Q12H Swayze, Ava, DO   10 mL at 01/07/20  1000  . sodium chloride flush (NS) 0.9 % injection 10-40 mL  10-40 mL Intracatheter PRN Swayze, Ava, DO      . triamcinolone 0.1 % cream : eucerin cream, 1:1   Topical BID [place], Bobby Rumpf, PA-C   Given at 01/07/20 0959  . vancomycin (VANCOCIN) IVPB 1000 mg/200 mL premix  1,000  mg Intravenous Q48H Brand Males, MD   Stopped at 01/07/20 0703     Discharge Medications: Please see discharge summary for a list of discharge medications.  Relevant Imaging Results:  Relevant Lab Results:   Additional Information SSN: [id number]  Archie Endo, LCSW

## 2020-01-07 NOTE — Discharge Summary (Signed)
Physician Discharge Summary  Gabrielle Wade OXB:[id number] DOB: 12-Jul-1956 DOA: 12/23/2019  PCP: Townsend Roger, MD  Admit date: 12/23/2019 Discharge date: 01/07/2020  Admitted From: SNF Disposition: SNF Recommendations for Outpatient Follow-up:  1. Follow up with SNF provider at earliest convenience with repeat CBC BMP 2. Outpatient follow-up with palliative care  3. follow up in ED if symptoms worsen or new appear   Home Health: No Equipment/Devices: Patient has a chronic trach and PEG.  Right upper extremity PICC line was removed.  Right jugular tunneled catheter placement by IR on 01/06/2020  Discharge Condition: Guarded to poor CODE STATUS: Full Diet recommendation: Tube feeding diet as per dietary recommendations  Brief/Interim Summary: 64 year old female with history of anxiety/depression, PTSD, MRSA epidural abscess, quadriplegia, diabetes mellitus type 2, chronic kidney disease stage III, COPD, chronic hypoxic respiratory failure on chronic trach/vent dependence, chronic dysphagia status post PEG tube, GERD, anemia presented with hemoglobin of 6.8 and WBC of 0.6 per [facility].  On presentation, chest x-ray showed bilateral airspace opacities.  She was found to have MRSA, ESBL E. coli bacteremia along with candidemia.  ID and oncology were consulted.  She was treated with meropenem, and vancomycin and Diflucan.  PCCM was consulted as well for trach/vent management.  Echo showed EF of 50 to 50% with grade 2 diastolic dysfunction.  She was transfused 1 unit of packed red cells on 12/28/2019.  She was found to have left brachial vein DVT involving the PICC line.  Eliquis was started.  She underwent right jugular tunneled catheter placement by IR on 01/06/2020 and removal of the left upper extremity PICC line.  There was a concern for right upper arm/forearm abscess but hand surgery evaluated the patient and recommended to continue medical management.  She is already on IV vancomycin.  She  will be discharged to SNF once bed is available.  Overall prognosis is very poor.  Recommend continuing palliative care discussions at SNF.  Discharge Diagnoses:   Sepsis: Present on admission -Resolved.  Currently hemodynamically stable.  Polymicrobial bloodstream infection including MRSA, ESBL E. coli bacteremia and candidemia with Candida parapsilosis -Source was probably right arm PICC line which was removed after admission.  Left heel x-ray did not show evidence of osteomyelitis.  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  Right upper extremity duplex was negative for DVT. -Has completed treatment for E. coli bacteremia with meropenem. -Currently on Diflucan and vancomycin.  ID has recommended vancomycin till 02/02/2020 along with oral rifampin to complete 6 weeks of therapy.  Continue Diflucan till 01/10/2020.  Repeat blood cultures from 12/27/2019 have been negative so far.  Left brachial vein DVT involving the PICC line -Duplex ultrasound on 01/05/2020 showed the same.  Eliquis was started on 01/05/2020.  This can be continued at least for 3 months.  -she underwent right jugular tunneled catheter placement by IR on 01/06/2020 and subsequent removal of the left upper extremity PICC line  Right forearm swelling -Soft tissue ultrasound showed phlegmonous changes/developing abscess.  Currently on vancomycin.    Hand surgery evaluation appreciated and recommended no surgical intervention at this time.  Neutropenia/anemia -Likely due to sepsis from bacteremia. -Hematology evaluated the patient during the hospitalization. -Status post 1 unit of packed red cell transfusion on 12/28/2019. -Subsequently white blood cell count has normalized. -Transfuse packed red cell if hemoglobin is less than 7. -Outpatient follow-up with hematology  Hypernatremia -Continue free  water.  Monitor  Acute on chronic  hypoxic respiratory failure/chronic vent dependence on tracheostomy -PCCM managing tracheostomy on ventilator.  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  Acute kidney injury -Resolved  Diabetes mellitus type 2 -Continue long-acting and short-acting insulin with CBGs with SSI  Hypoalbuminemia -Continue tube feeding as per dietary recommendations.  Generalized deconditioning -Overall prognosis is very poor.  Palliative care consultation appreciated.  Currently remains full code.  Recommend outpatient follow-up with palliative care   Discharge Instructions  Discharge Instructions    Activity as tolerated - No restrictions   Complete by: As directed    Call MD for:  difficulty breathing, headache or visual disturbances   Complete by: As directed    Call MD for:  severe uncontrolled pain   Complete by: As directed    Call MD for:  temperature >100.4   Complete by: As directed    Diet Carb Modified   Complete by: As directed    Discharge patient   Complete by: As directed    Discharge disposition: 03-Skilled Nursing Facility   Discharge patient date: 01/07/2020   Home infusion instructions   Complete by: As directed    Instructions: Flushing of vascular access device: 0.9% NaCl pre/post medication administration and prn patency; Heparin 100 u/ml, 52m for implanted ports and Heparin 10u/ml, 519mfor all other central venous catheters.     Allergies as of 01/07/2020      Reactions   Codeine    Hydrocodone    Influenza A (h1n1) Monovalent Vaccine    Latex    Morphine And Related    Penicillins    Shellfish Allergy    Tape       Medication List    STOP taking these medications   cephALEXin 500 MG capsule Commonly known as: KEFLEX   famotidine 20 MG tablet Commonly known as: PEPCID   insulin NPH Human 100 UNIT/ML injection Commonly known as: NOVOLIN N   oxyCODONE-acetaminophen 5-325 MG  tablet Commonly known as: PERCOCET/ROXICET     TAKE these medications   acetaminophen 325 MG tablet Commonly known as: TYLENOL Take 2 tablets (650 mg total) by mouth every 6 (six) hours as needed for mild pain (or Fever >/= 101).   apixaban 5 MG Tabs tablet Commonly known as: ELIQUIS 1019mID till 01/12/2020 then 5mg43mD onwards   budesonide 0.5 MG/2ML nebulizer solution Commonly known as: PULMICORT Take 0.5 mg by nebulization 2 (two) times daily.   chlorhexidine 0.12 % solution Commonly known as: PERIDEX Use as directed 15 mLs in the mouth or throat See admin instructions. By shift starting   feeding supplement (PRO-STAT SUGAR FREE 64) Liqd Place 30 mLs into feeding tube 3 (three) times daily.   ferrous sulfate 300 (60 Fe) MG/5ML syrup Place 5 mLs (300 mg total) into feeding tube daily with breakfast.   fluconazole  IVPB Commonly known as: DIFLUCAN Inject 400 mg into the vein daily for 11 days. Indication:  Candida bacteremia  Last Day of Therapy:  01/10/2020 Labs - Once weekly:  CBC/D and BMP, Labs - Every other week:  ESR and CRP   free water Soln Place 300 mLs into feeding tube every 4 (four) hours.   Gerhardt's butt cream Crea Apply 1 application topically 4 (four) times daily.   insulin aspart 100 UNIT/ML injection Commonly known as: novoLOG Inject 5 Units into the skin every 4 (four) hours.   insulin glargine 100 UNIT/ML injection  Commonly known as: LANTUS Inject 0.06 mLs (6 Units total) into the skin daily.   insulin lispro 100 UNIT/ML injection Commonly known as: HUMALOG Inject 3-12 Units into the skin See admin instructions. < 60 or > 400 notify Dr; 150 - 200, 3 units; 201 - 250, 6 units; 251 - 300 8 units; 301 - 350, 12 units   ipratropium-albuterol 0.5-2.5 (3) MG/3ML Soln Commonly known as: DUONEB Take 3 mLs by nebulization every 4 (four) hours as needed (shortness of breath).   ISOSOURCE 1.5 CAL PO Take 55 mL/hr by mouth See admin instructions. By  shift starting   levothyroxine 25 MCG tablet Commonly known as: SYNTHROID Take 1 tablet (25 mcg total) by mouth daily at 6 (six) AM.   LORazepam 2 MG/ML concentrated solution Commonly known as: ATIVAN Place 0.5 mLs (1 mg total) into feeding tube every 4 (four) hours as needed for anxiety. What changed:   how much to take  how to take this  when to take this   oxyCODONE 5 MG immediate release tablet Commonly known as: Oxy IR/ROXICODONE Take 1 tablet (5 mg total) by mouth every 4 (four) hours as needed for moderate pain or severe pain.   pantoprazole sodium 40 mg/20 mL Pack Commonly known as: PROTONIX Place 20 mLs (40 mg total) into feeding tube daily at 12 noon.   rifabutin 150 MG capsule Commonly known as: MYCOBUTIN Take 2 capsules (300 mg total) by mouth daily for 26 days.   traZODone 50 MG tablet Commonly known as: DESYREL Take 50 mg by mouth at bedtime.   triamcinolone 0.1 % cream : eucerin Crea Apply 1 application topically 2 (two) times daily. Apply to affected areas bid   vancomycin  IVPB Inject 1,000 mg into the vein every other day. Indication:  MRSA bacteremia Last Day of Therapy:  02/02/2020 Labs - Sunday/Monday:  CBC/D, BMP, and vancomycin trough. Labs - Thursday:  BMP and vancomycin trough Labs - Every other week:  ESR and CRP            Home Infusion Instuctions  (From admission, onward)         Start     Ordered   12/31/19 0000  Home infusion instructions    Question:  Instructions  Answer:  Flushing of vascular access device: 0.9% NaCl pre/post medication administration and prn patency; Heparin 100 u/ml, 70m for implanted ports and Heparin 10u/ml, 569mfor all other central venous catheters.   12/31/19 1244         Follow-up Information    [facility] .   Specialty: Internal Medicine Contact information: [id number] [phone]        Allergies  Allergen Reactions  . Codeine   . Hydrocodone    . Influenza A (H1n1) Monovalent Vaccine   . Latex   . Morphine And Related   . Penicillins   . Shellfish Allergy   . Tape     Consultations:  PCCM/ID/hematology/IR   Procedures/Studies: CT CHEST WO CONTRAST  Result Date: 12/24/2019 CLINICAL DATA:  Shortness of breath and chest pain EXAM: CT CHEST WITHOUT CONTRAST TECHNIQUE: Multidetector CT imaging of the chest was performed following the standard protocol without IV contrast. COMPARISON:  Chest x-ray from the previous day. FINDINGS: Cardiovascular: Somewhat limited due to lack of IV contrast. Aortic calcifications are seen without aneurysmal dilatation. No significant cardiac enlargement is seen. Implantable pacemaker is noted within the right ventricle. No  significant coronary calcifications are noted. Mediastinum/Nodes: Thoracic inlet demonstrates a tracheostomy tube in satisfactory position. No focal mass lesion is seen. Scattered small mediastinal and hilar lymph nodes are seen likely reactive in nature given the findings in the lungs. The esophagus appears within normal limits. Lungs/Pleura: Large pleural effusions are noted bilaterally right slightly greater than left with associated lower lobe consolidation. Changes of mucous plugging are noted within the lower lobe bronchial tree on the left. No large central mass is seen. The upper lobes demonstrates some posterior dependent atelectatic changes. Small apical nodules are noted bilaterally. The largest of these is seen in the right upper lobe measuring approximately 6 mm. Upper Abdomen: Visualized upper abdomen shows no acute abnormality Musculoskeletal: Multilevel degenerative changes of the thoracic spine are seen. No acute bony abnormality is noted. Postsurgical changes in the cervicothoracic spine are seen. IMPRESSION: Large bilateral pleural effusions with underlying consolidation in the lower lobes and upper lobe atelectatic changes. These changes are consistent with bilateral  pneumonia. Reactive lymphadenopathy is seen. Small nodular densities within the apices bilaterally. Non-contrast chest CT at 3-6 months is recommended. If the nodules are stable at time of repeat CT, then future CT at 18-24 months (from today's scan) is considered optional for low-risk patients, but is recommended for high-risk patients. This recommendation follows the consensus statement: Guidelines for Management of Incidental Pulmonary Nodules Detected on CT Images: From the Fleischner Society 2017; Radiology 2017; 284:228-243. Aortic Atherosclerosis (ICD10-I70.0). Electronically Signed   By: Inez Catalina M.D.   On: 12/24/2019 17:30   IR Fluoro Guide CV Line Right  Result Date: 01/06/2020 INDICATION: 64 year old with history of sepsis and bacteremia. Patient needs long-term IV antibiotics. EXAM: FLUOROSCOPIC AND ULTRASOUND GUIDED PLACEMENT OF A TUNNELED CENTRAL VENOUS CATHETER Physician: Stephan Minister. Henn, MD FLUOROSCOPY TIME:  18 seconds, 2 mGy MEDICATIONS: None ANESTHESIA/SEDATION: None PROCEDURE: Informed consent was obtained for placement of a tunneled central venous catheter. The patient was placed supine on the interventional table. Ultrasound confirmed a patent right internal jugularvein. Ultrasound images were obtained for documentation. The right neck and chest was prepped and draped in a sterile fashion. The right neck was anesthetized with 1% lidocaine. Maximal barrier sterile technique was utilized including caps, mask, sterile gowns, sterile gloves, sterile drape, hand hygiene and skin antiseptic. A small incision was made with #11 blade scalpel. A 21 gauge needle directed into the right internal jugular vein with ultrasound guidance. A micropuncture dilator set was placed. A dual lumen Powerline catheter was selected. The skin below the right clavicle was anesthetized and a small incision was made with an #11 blade scalpel. A subcutaneous tunnel was formed to the vein dermatotomy site. The catheter  was brought through the tunnel. The vein dermatotomy site was dilated to accommodate a peel-away sheath. The catheter was placed through the peel-away sheath and directed into the central venous structures. The tip of the catheter was placed at the superior cavoatrial junction with fluoroscopy. Fluoroscopic images were obtained for documentation. Both lumens were found to aspirate and flush well. Both lumens were flushed with normal saline and capped. The vein dermatotomy site was closed using interrupted sutures. The catheter was secured to the skin using Prolene suture. FINDINGS: Catheter tip at the superior cavoatrial junction. COMPLICATIONS: None IMPRESSION: Successful placement of a right jugular tunneled central venous catheter using ultrasound and fluoroscopic guidance. Sutures at the right neck incision can be removed in 2 weeks. Electronically Signed   By: Markus Daft M.D.   On: 01/06/2020  14:32   IR US Guide Vasc Access Right  Result Date: 01/06/2020 INDICATION: 64 year old with history of sepsis and bacteremia. Patient needs long-term IV antibiotics. EXAM: FLUOROSCOPIC AND ULTRASOUND GUIDED PLACEMENT OF A TUNNELED CENTRAL VENOUS CATHETER Physician: Stephan Minister. Henn, MD FLUOROSCOPY TIME:  18 seconds, 2 mGy MEDICATIONS: None ANESTHESIA/SEDATION: None PROCEDURE: Informed consent was obtained for placement of a tunneled central venous catheter. The patient was placed supine on the interventional table. Ultrasound confirmed a patent right internal jugularvein. Ultrasound images were obtained for documentation. The right neck and chest was prepped and draped in a sterile fashion. The right neck was anesthetized with 1% lidocaine. Maximal barrier sterile technique was utilized including caps, mask, sterile gowns, sterile gloves, sterile drape, hand hygiene and skin antiseptic. A small incision was made with #11 blade scalpel. A 21 gauge needle directed into the right internal jugular vein with ultrasound  guidance. A micropuncture dilator set was placed. A dual lumen Powerline catheter was selected. The skin below the right clavicle was anesthetized and a small incision was made with an #11 blade scalpel. A subcutaneous tunnel was formed to the vein dermatotomy site. The catheter was brought through the tunnel. The vein dermatotomy site was dilated to accommodate a peel-away sheath. The catheter was placed through the peel-away sheath and directed into the central venous structures. The tip of the catheter was placed at the superior cavoatrial junction with fluoroscopy. Fluoroscopic images were obtained for documentation. Both lumens were found to aspirate and flush well. Both lumens were flushed with normal saline and capped. The vein dermatotomy site was closed using interrupted sutures. The catheter was secured to the skin using Prolene suture. FINDINGS: Catheter tip at the superior cavoatrial junction. COMPLICATIONS: None IMPRESSION: Successful placement of a right jugular tunneled central venous catheter using ultrasound and fluoroscopic guidance. Sutures at the right neck incision can be removed in 2 weeks. Electronically Signed   By: Markus Daft M.D.   On: 01/06/2020 14:32   DG CHEST PORT 1 VIEW  Result Date: 12/28/2019 CLINICAL DATA:  Respiratory crackles. EXAM: PORTABLE CHEST 1 VIEW COMPARISON:  Chest CT 12/24/2019 and chest radiograph 12/23/2019 FINDINGS: Tracheostomy tube overlies the airway. The cardiac silhouette remains mildly enlarged. Veiling opacities in both lower lungs are similar to the prior radiograph and consistent with right larger than left pleural effusions. Patchy airspace opacities in the perihilar regions and upper lobes have improved. Right greater than left lower lung airspace opacities have at most slightly improved on the left. No pneumothorax is identified. IMPRESSION: 1. Persistent right larger than left pleural effusions with basilar atelectasis or consolidation. 2. Mildly  improved perihilar and upper lung opacities which may reflect decreased edema or pneumonia. Electronically Signed   By: Logan Bores M.D.   On: 12/28/2019 08:38   DG Chest Port 1 View  Result Date: 12/23/2019 CLINICAL DATA:  Sepsis EXAM: PORTABLE CHEST 1 VIEW COMPARISON:  None. FINDINGS: There are moderate to large bilateral pleural effusions. The heart size is enlarged. There is scattered bilateral airspace opacities. There are prominent interstitial lung markings. There are dense bibasilar airspace opacities. There is no pneumothorax. There are old healed left-sided rib fractures. IMPRESSION: 1. Cardiomegaly with volume overload including moderate bilateral pleural effusions. 2. Scattered bilateral airspace opacities may be secondary to developing pulmonary edema or an atypical infectious process. 3. Bibasilar airspace opacities favored to represent atelectasis with an infiltrate not excluded. Electronically Signed   By: Constance Holster M.D.   On: 12/23/2019 17:25  DG Foot 2 Views Left  Result Date: 12/25/2019 CLINICAL DATA:  64 year old female with bacteremia and left heel wound. Query osteomyelitis. EXAM: LEFT FOOT - 2 VIEW COMPARISON:  None. FINDINGS: The calcaneus appears intact with degenerative spurring. There is soft tissue swelling in the left foot maximal at the metatarsal region. Calcified peripheral vascular disease. No soft tissue gas. There is a tiny 2-3 millimeter nonspecific density or dystrophic calcification along the plantar surface of the foot at the level of the metatarsals (arrow). No acute fracture or cortical osteolysis identified. There is a healed fracture of the 5th metatarsal. IMPRESSION: 1. Soft tissue swelling with no plain radiographic evidence of osteomyelitis. 2. Calcified peripheral vascular disease. Tiny 2-3 millimeter nonspecific foreign body or dystrophic calcification in the plantar soft tissues (arrow). 3. Healed 5th metatarsal fracture. Electronically Signed   By: Genevie Ann M.D.   On: 12/25/2019 15:10   ECHOCARDIOGRAM COMPLETE  Result Date: 12/24/2019   ECHOCARDIOGRAM REPORT   Patient Name:   Kimberlin Angelos Date of Exam: 12/24/2019 Medical Rec #:  [id number]      Height:       70.0 in Accession #:    [id number]     Weight:       201.0 lb Date of Birth:  February 19, 1956     BSA:          2.09 m Patient Age:    27 years       BP:           118/43 mmHg Patient Gender: F              HR:           69 bpm. Exam Location:  Inpatient Procedure: 2D Echo Indications:    Dyspnea R06.00  History:        Patient has no prior history of Echocardiogram examinations.                 COPD; Risk Factors:Diabetes.  Sonographer:    Mikki Santee RDCS (AE) Referring Phys: [place]  1. Left ventricular ejection fraction, by visual estimation, is 55 to 60%. The left ventricle has normal function. There is no left ventricular hypertrophy.  2. Left ventricular diastolic parameters are consistent with Grade II diastolic dysfunction (pseudonormalization).  3. Elevated left ventricular end-diastolic pressure.  4. Global right ventricle has normal systolic function.The right ventricular size is normal. No increase in right ventricular wall thickness.  5. Left atrial size was normal.  6. Right atrial size was normal.  7. The mitral valve is normal in structure. Mild mitral valve regurgitation. No evidence of mitral stenosis.  8. The tricuspid valve is normal in structure.  9. The aortic valve was not well visualized. Aortic valve regurgitation is not visualized. No evidence of aortic valve sclerosis or stenosis. 10. The pulmonic valve was normal in structure. Pulmonic valve regurgitation is trivial. 11. Normal pulmonary artery systolic pressure. 12. The inferior vena cava is dilated in size with >50% respiratory variability, suggesting right atrial pressure of 8 mmHg. FINDINGS  Left Ventricle: Left ventricular ejection fraction, by visual estimation, is 55 to 60%. The left ventricle has  normal function. The left ventricle is not well visualized. There is no left ventricular hypertrophy. Left ventricular diastolic parameters are consistent with Grade II diastolic dysfunction (pseudonormalization). Elevated left ventricular end-diastolic pressure. Right Ventricle: The right ventricular size is normal. No increase in right ventricular wall thickness. Global RV systolic function is has normal  systolic function. The tricuspid regurgitant velocity is 1.83 m/s, and with an assumed right atrial pressure  of 8 mmHg, the estimated right ventricular systolic pressure is normal at 21.4 mmHg. Left Atrium: Left atrial size was normal in size. Right Atrium: Right atrial size was normal in size Pericardium: There is no evidence of pericardial effusion. Mitral Valve: The mitral valve is normal in structure. Mild mitral valve regurgitation. No evidence of mitral valve stenosis by observation. Tricuspid Valve: The tricuspid valve is normal in structure. Tricuspid valve regurgitation is trivial. Aortic Valve: The aortic valve was not well visualized. Aortic valve regurgitation is not visualized. The aortic valve is structurally normal, with no evidence of sclerosis or stenosis. Pulmonic Valve: The pulmonic valve was normal in structure. Pulmonic valve regurgitation is trivial. Pulmonic regurgitation is trivial. Aorta: The aortic root, ascending aorta and aortic arch are all structurally normal, with no evidence of dilitation or obstruction. Venous: The inferior vena cava is dilated in size with greater than 50% respiratory variability, suggesting right atrial pressure of 8 mmHg. IAS/Shunts: No atrial level shunt detected by color flow Doppler. There is no evidence of a patent foramen ovale. No ventricular septal defect is seen or detected. There is no evidence of an atrial septal defect.  LEFT VENTRICLE PLAX 2D LVIDd:         5.20 cm  Diastology LVIDs:         3.50 cm  LV e' lateral:   6.42 cm/s LV PW:         0.70 cm   LV E/e' lateral: 19.0 LV IVS:        0.80 cm  LV e' medial:    6.09 cm/s LVOT diam:     2.10 cm  LV E/e' medial:  20.0 LV SV:         79 ml LV SV Index:   36.71 LVOT Area:     3.46 cm  RIGHT VENTRICLE RV S prime:     11.00 cm/s TAPSE (M-mode): 2.2 cm LEFT ATRIUM             Index       RIGHT ATRIUM           Index LA diam:        4.10 cm 1.96 cm/m  RA Area:     16.40 cm LA Vol (A2C):   37.2 ml 17.78 ml/m RA Volume:   42.20 ml  20.17 ml/m LA Vol (A4C):   33.8 ml 16.16 ml/m LA Biplane Vol: 37.9 ml 18.12 ml/m  AORTIC VALVE LVOT Vmax:   90.50 cm/s LVOT Vmean:  57.500 cm/s LVOT VTI:    0.200 m  AORTA Ao Root diam: 3.10 cm MITRAL VALVE                         TRICUSPID VALVE MV Area (PHT): 3.65 cm              TR Peak grad:   13.4 mmHg MV PHT:        60.32 msec            TR Vmax:        183.00 cm/s MV Decel Time: 208 msec MV E velocity: 122.00 cm/s 103 cm/s  SHUNTS MV A velocity: 98.80 cm/s  70.3 cm/s Systemic VTI:  0.20 m MV E/A ratio:  1.23        1.5       Systemic Diam: 2.10  cm  Cherlynn Kaiser MD Electronically signed by Cherlynn Kaiser MD Signature Date/Time: 12/24/2019/8:36:20 PM    Final    [place] RT UPPER EXTREM LTD SOFT TISSUE NON VASCULAR  Result Date: 01/05/2020 CLINICAL DATA:  Right upper extremity swelling, evaluate for abscess EXAM: ULTRASOUND RIGHT UPPER EXTREMITY LIMITED TECHNIQUE: Ultrasound examination of the upper extremity soft tissues was performed in the area of clinical concern. COMPARISON:  None. FINDINGS: Within the distal aspect of the right forearm, there is marked soft tissue swelling and subcutaneous edema. At the area of clinical concern, there is a more focal area of ill-defined complex fluid within the subcutaneous tissues. No thickened, well-defined wall. No sonographic evidence of soft tissue gas. IMPRESSION: Extensive soft tissue swelling and edema at the right distal forearm with ill-defined fluid at the site of clinical concern suggestive of phlegmonous change/developing  abscess. No well-defined wall to suggest a drainable component at this time. Electronically Signed   By: Davina Poke D.O.   On: 01/05/2020 13:28   VAS [place] UPPER EXTREMITY VENOUS DUPLEX  Result Date: 01/05/2020 UPPER VENOUS STUDY  Indications: Edema Limitations: Poor ultrasound/tissue interface, bandages, line and depth of vessels, patient's pain with touch to right arm. Comparison Study: Prior right upper extremity venous duplex done 12/25/19 Performing Technologist: Sharion Dove RVS  Examination Guidelines: A complete evaluation includes B-mode imaging, spectral Doppler, color Doppler, and power Doppler as needed of all accessible portions of each vessel. Bilateral testing is considered an integral part of a complete examination. Limited examinations for reoccurring indications may be performed as noted.  Right Findings: +----------+------------+---------+-----------+----------+---------------------+ RIGHT     CompressiblePhasicitySpontaneousProperties       Summary        +----------+------------+---------+-----------+----------+---------------------+ IJV           Full       Yes       Yes                                    +----------+------------+---------+-----------+----------+---------------------+ Subclavian               Yes       Yes                                    +----------+------------+---------+-----------+----------+---------------------+ Axillary                 Yes       Yes                                    +----------+------------+---------+-----------+----------+---------------------+ Brachial                                            only able to discern                                                       one of the brachial  veins         +----------+------------+---------+-----------+----------+---------------------+ Radial                                                  patent by color    +----------+------------+---------+-----------+----------+---------------------+ Ulnar                                                  Not visualized     +----------+------------+---------+-----------+----------+---------------------+ Cephalic      Full                                                        +----------+------------+---------+-----------+----------+---------------------+ Basilic       Full                                                        +----------+------------+---------+-----------+----------+---------------------+  Left Findings: +----------+------------+---------+-----------+----------+---------------------+ LEFT      CompressiblePhasicitySpontaneousProperties       Summary        +----------+------------+---------+-----------+----------+---------------------+ IJV           Full       Yes       Yes                                    +----------+------------+---------+-----------+----------+---------------------+ Subclavian               Yes       Yes                                    +----------+------------+---------+-----------+----------+---------------------+ Axillary                 Yes       Yes                                    +----------+------------+---------+-----------+----------+---------------------+ Brachial    Partial      No        No                 DVT noted in the                                                         brachial vein with  PICC line       +----------+------------+---------+-----------+----------+---------------------+ Radial                                                 patent by color    +----------+------------+---------+-----------+----------+---------------------+ Ulnar                                                  patent by color     +----------+------------+---------+-----------+----------+---------------------+ Cephalic      Full                                                        +----------+------------+---------+-----------+----------+---------------------+ Basilic       Full                                                        +----------+------------+---------+-----------+----------+---------------------+  Summary:  Right: No evidence of deep vein thrombosis in the upper extremity. No evidence of superficial vein thrombosis in the upper extremity. However, unable to visualize one of the Brachial veins.  Left: No evidence of superficial vein thrombosis in the upper extremity. Findings consistent with age indeterminate deep vein thrombosis involving the left brachial vein containing PICC line.  *See table(s) above for measurements and observations.  Diagnosing physician: Monica Martinez MD Electronically signed by Monica Martinez MD on 01/05/2020 at 4:02:28 PM.    Final    VAS [place] UPPER EXTREMITY VENOUS DUPLEX  Result Date: 12/25/2019 UPPER VENOUS STUDY  Indications: Swelling Risk Factors: None identified. Limitations: Poor ultrasound/tissue interface, body habitus, bandages and patient immobility. Comparison Study: No prior studies. Performing Technologist: Oliver Hum RVT  Examination Guidelines: A complete evaluation includes B-mode imaging, spectral Doppler, color Doppler, and power Doppler as needed of all accessible portions of each vessel. Bilateral testing is considered an integral part of a complete examination. Limited examinations for reoccurring indications may be performed as noted.  Right Findings: +----------+------------+---------+-----------+----------+-------+ RIGHT     CompressiblePhasicitySpontaneousPropertiesSummary +----------+------------+---------+-----------+----------+-------+ IJV           Full       Yes       Yes                       +----------+------------+---------+-----------+----------+-------+ Subclavian    Full       Yes       Yes                      +----------+------------+---------+-----------+----------+-------+ Axillary      Full       Yes       Yes                      +----------+------------+---------+-----------+----------+-------+ Brachial      Full       Yes       Yes                      +----------+------------+---------+-----------+----------+-------+  Radial        Full                                          +----------+------------+---------+-----------+----------+-------+ Ulnar         Full                                          +----------+------------+---------+-----------+----------+-------+ Cephalic      Full                                          +----------+------------+---------+-----------+----------+-------+ Basilic       Full                                          +----------+------------+---------+-----------+----------+-------+  Left Findings: +----------+------------+---------+-----------+----------+-------+ LEFT      CompressiblePhasicitySpontaneousPropertiesSummary +----------+------------+---------+-----------+----------+-------+ Subclavian    Full       Yes       Yes                      +----------+------------+---------+-----------+----------+-------+  Summary:  Right: No evidence of deep vein thrombosis in the upper extremity. No evidence of superficial vein thrombosis in the upper extremity.  Left: No evidence of thrombosis in the subclavian.  *See table(s) above for measurements and observations.  Diagnosing physician: Curt Jews MD Electronically signed by Curt Jews MD on 12/25/2019 at 4:40:08 PM.    Final        Subjective: Patient seen and examined at bedside.  Poor historian.  Nods her head to some questions.  Had episode of hypoglycemia early this morning as per nursing staff.  Discharge Exam: Vitals:   01/07/20  0800 01/07/20 0900  BP: (!) 111/55 (!) 103/49  Pulse: 74 78  Resp: 13 12  Temp: (!) 94.3 F (34.6 C)   SpO2: 99% 98%    General exam:  Looks chronically ill and extremely deconditioned.  No distress.  Poor historian. 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  PEG tube present.  Bowel sounds heard.   Extremities: Bilateral lower extremity edema present.  Bilateral upper extremities are swollen as well. Central nervous system: Quadriplegic.  Nods her head to some questions.  Lymph: No cervical lymphadenopathy Psychiatry: Flat affect    The results of significant diagnostics from this hospitalization (including imaging, microbiology, ancillary and laboratory) are listed below for reference.     Microbiology: Recent Results (from the past 240 hour(s))  SARS CORONAVIRUS 2 (TAT 6-24 HRS) Nasopharyngeal Nasopharyngeal Swab     Status: None   Collection Time: 12/31/19  1:40 PM   Specimen: Nasopharyngeal Swab  Result Value Ref Range Status   SARS Coronavirus 2 NEGATIVE NEGATIVE Final    Comment: (NOTE) SARS-CoV-2 target nucleic acids are NOT DETECTED. The SARS-CoV-2 RNA is generally detectable in upper and lower respiratory specimens during the acute phase of infection. Negative results do not preclude SARS-CoV-2 infection, do  not rule out co-infections with other pathogens, and should not be used as the sole basis for treatment or other patient management decisions. Negative results must be combined with clinical observations, patient history, and epidemiological information. The expected result is Negative. Fact Sheet for Patients: SugarRoll.be Fact Sheet for Healthcare Providers: https://www.woods-mathews.com/ This test is not  yet approved or cleared by the [place] FDA and  has been authorized for detection and/or diagnosis of SARS-CoV-2 by FDA under an Emergency Use Authorization (EUA). This EUA will remain  in effect (meaning this test can be used) for the duration of the COVID-19 declaration under Section 56 4(b)(1) of the Act, 21 U.S.C. section 360bbb-3(b)(1), unless the authorization is terminated or revoked sooner. Performed at [facility], [place] [address] [place], [place] [address]   SARS CORONAVIRUS 2 (TAT 6-24 HRS) Nasopharyngeal Nasopharyngeal Swab     Status: None   Collection Time: 01/06/20 12:07 PM   Specimen: Nasopharyngeal Swab  Result Value Ref Range Status   SARS Coronavirus 2 NEGATIVE NEGATIVE Final    Comment: (NOTE) SARS-CoV-2 target nucleic acids are NOT DETECTED. The SARS-CoV-2 RNA is generally detectable in upper and lower respiratory specimens during the acute phase of infection. Negative results do not preclude SARS-CoV-2 infection, do not rule out co-infections with other pathogens, and should not be used as the sole basis for treatment or other patient management decisions. Negative results must be combined with clinical observations, patient history, and epidemiological information. The expected result is Negative. Fact Sheet for Patients: SugarRoll.be Fact Sheet for Healthcare Providers: https://www.woods-mathews.com/ This test is not yet approved or cleared by the [place] FDA and  has been authorized for detection and/or diagnosis of SARS-CoV-2 by FDA under an Emergency Use Authorization (EUA). This EUA will remain  in effect (meaning this test can be used) for the duration of the COVID-19 declaration under Section 56 4(b)(1) of the Act, 21 U.S.C. section 360bbb-3(b)(1), unless the authorization is terminated or revoked sooner. Performed at [facility], [place] [address] [place], [place] [address]       Labs: BNP (last 3 results) No results for input(s): BNP in the last 8760 hours. Basic Metabolic Panel: Recent Labs  Lab 01/02/20 0708 01/03/20 0531 01/05/20 0301 01/06/20 0350 01/07/20 0428  NA 149* 144 141 140 135  K 4.4 4.6 4.7 4.9 4.9  CL 110 105 103 103 100  CO2 33* 32 32 32 30  GLUCOSE 146* 105* 133* 72 124*  BUN 98* 98* 97* 94* 95*  CREATININE 0.81 0.73 0.83 0.79 0.82  CALCIUM 9.1 9.0 8.7* 8.7* 8.6*  MG 2.1 2.0 2.2 2.1 2.1   Liver Function Tests: Recent Labs  Lab 01/01/20 0524 01/03/20 0531 01/05/20 0301 01/07/20 0428  AST 16 16 17  14*  ALT 22 19 21 19   ALKPHOS 170* 169* 188* 160*  BILITOT 0.6 0.5 0.7 0.5  PROT 7.2 6.9 7.0 6.6  ALBUMIN 1.3* 1.3* 1.3* 1.3*   No results for input(s): LIPASE, AMYLASE in the last 168 hours. No results for input(s): AMMONIA in the last 168 hours. CBC: Recent Labs  Lab 01/02/20 0708 01/03/20 0531 01/05/20 0301 01/06/20 0350 01/07/20 0428  WBC 4.2 4.3 6.7 7.9 7.6  NEUTROABS 1.8 2.6 3.6 5.0 5.1  HGB 7.7* 7.7* 7.3* 7.5* 7.2*  HCT 26.9* 26.4* 24.4* 25.1* 23.5*  MCV 100.7* 99.2 97.2 96.5 95.9  PLT 196 199 238 219 221   Cardiac Enzymes: No results for input(s): CKTOTAL, CKMB, CKMBINDEX, TROPONINI  in the last 168 hours. BNP: Invalid input(s): POCBNP CBG: Recent Labs  Lab 01/06/20 1952 01/06/20 2349 01/07/20 0335 01/07/20 0803 01/07/20 0834  GLUCAP 145* 119* 113* 57* 112*   D-Dimer No results for input(s): DDIMER in the last 72 hours. Hgb A1c No results for input(s): HGBA1C in the last 72 hours. Lipid Profile No results for input(s): CHOL, HDL, LDLCALC, TRIG, CHOLHDL, LDLDIRECT in the last 72 hours. Thyroid function studies No results for input(s): TSH, T4TOTAL, T3FREE, THYROIDAB in the last 72 hours.  Invalid input(s): FREET3 Anemia work up No results for input(s): VITAMINB12, FOLATE, FERRITIN, TIBC, IRON, RETICCTPCT in the last 72 hours. Urinalysis    Component Value Date/Time   COLORURINE YELLOW  12/25/2019 1159   APPEARANCEUR HAZY (A) 12/25/2019 1159   LABSPEC 1.016 12/25/2019 1159   PHURINE 6.0 12/25/2019 1159   GLUCOSEU NEGATIVE 12/25/2019 1159   HGBUR NEGATIVE 12/25/2019 1159   BILIRUBINUR NEGATIVE 12/25/2019 1159   KETONESUR 5 (A) 12/25/2019 1159   PROTEINUR 30 (A) 12/25/2019 1159   NITRITE NEGATIVE 12/25/2019 1159   LEUKOCYTESUR NEGATIVE 12/25/2019 1159   Sepsis Labs Invalid input(s): PROCALCITONIN,  WBC,  LACTICIDVEN Microbiology Recent Results (from the past 240 hour(s))  SARS CORONAVIRUS 2 (TAT 6-24 HRS) Nasopharyngeal Nasopharyngeal Swab     Status: None   Collection Time: 12/31/19  1:40 PM   Specimen: Nasopharyngeal Swab  Result Value Ref Range Status   SARS Coronavirus 2 NEGATIVE NEGATIVE Final    Comment: (NOTE) SARS-CoV-2 target nucleic acids are NOT DETECTED. The SARS-CoV-2 RNA is generally detectable in upper and lower respiratory specimens during the acute phase of infection. Negative results do not preclude SARS-CoV-2 infection, do not rule out co-infections with other pathogens, and should not be used as the sole basis for treatment or other patient management decisions. Negative results must be combined with clinical observations, patient history, and epidemiological information. The expected result is Negative. Fact Sheet for Patients: SugarRoll.be Fact Sheet for Healthcare Providers: https://www.woods-mathews.com/ This test is not yet approved or cleared by the [place] FDA and  has been authorized for detection and/or diagnosis of SARS-CoV-2 by FDA under an Emergency Use Authorization (EUA). This EUA will remain  in effect (meaning this test can be used) for the duration of the COVID-19 declaration under Section 56 4(b)(1) of the Act, 21 U.S.C. section 360bbb-3(b)(1), unless the authorization is terminated or revoked sooner. Performed at [facility], [place] [address], [place],  [place] [address]   SARS CORONAVIRUS 2 (TAT 6-24 HRS) Nasopharyngeal Nasopharyngeal Swab     Status: None   Collection Time: 01/06/20 12:07 PM   Specimen: Nasopharyngeal Swab  Result Value Ref Range Status   SARS Coronavirus 2 NEGATIVE NEGATIVE Final    Comment: (NOTE) SARS-CoV-2 target nucleic acids are NOT DETECTED. The SARS-CoV-2 RNA is generally detectable in upper and lower respiratory specimens during the acute phase of infection. Negative results do not preclude SARS-CoV-2 infection, do not rule out co-infections with other pathogens, and should not be used as the sole basis for treatment or other patient management decisions. Negative results must be combined with clinical observations, patient history, and epidemiological information. The expected result is Negative. Fact Sheet for Patients: SugarRoll.be Fact Sheet for Healthcare Providers: https://www.woods-mathews.com/ This test is not yet approved or cleared by the [place] FDA and  has been authorized for detection and/or diagnosis of SARS-CoV-2 by FDA under an Emergency Use Authorization (EUA). This EUA will remain  in effect (meaning this test  can be used) for the duration of the COVID-19 declaration under Section 56 4(b)(1) of the Act, 21 U.S.C. section 360bbb-3(b)(1), unless the authorization is terminated or revoked sooner. Performed at [facility], [place] [address], [place], [place] [address]      Time coordinating discharge: 35 minutes  SIGNED:   Aline August, MD  [facility] 01/07/2020, 10:06 AM

## 2020-01-07 NOTE — Progress Notes (Addendum)
NAME:  Gabrielle Wade, MRN:  [id number], DOB:  March 25, 1956, LOS: 58 ADMISSION DATE:  12/23/2019, CONSULTATION DATE:  12/22/2018 REFERRING MD:  Dr. 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 She is ventilator depended due to respiratory failure and was transferred from [facility] for evaluation of her leukopenia and anemia. PCCM was consulted by [facility] for chronic tracheostomy and ventilator management.   History of present illness   HPI obtained from medical chart review as patient is trach/ mechanical ventilator dependent.  No bedside medical records found, therefore limited HPI.   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  In ER, hemodynamically stable and initial temp 95.5.  Patient is awake in no distress on ventilator with settings PRVC, rate 12, FiO2 30%, TV 480, PEEP 5.  Unknown if patient weans.  Workup notable for WBC 0.8, Hgb 6.2, platelets 255, glucose 177, BUN 69, sCr 1.24, alk phos 247, albumin 1.2, corrected calcium 10.8, trop hs 5, lactic acid 1, normal coags, TSH 24.57, free T4 0.66, FOB negative, SARS2 PCR neg, UA negative.  CXR showing cardiomegaly with moderate bilateral pleural effusions, bilateral airspace and bibasilar opacities concerning for developing edema vs atypical infectious process vs atelectasis vs developing infiltrate.  TRH to admit, PCCM consulted for vent and tracheostomy management.   Past Medical History  Cervical epidural abscess- MRSA in 08/2019 with resultant quadriplegia with tracheostomy and ventilator dependent respiratory failure,  PEG, DMT2, CKD stage III, COPD, GERD, anxiety/ depression, PTSD  Significant Hospital Events   1/4 Admit [facility]  Consults:  PCCM - trach / vent   Procedures:  pta trach pta PEG R jugular Powerline catheter placement Significant Diagnostic Tests:  12/24/19 Complete Echocardiogram:  -LVEF 55-60% - Grade II Diastolic Dysfunction Micro Data:  1/4 SARS 2/ Flu A/B >> neg 1/4 UC >> >100K cfus of E coli and klebsiella pneumonia 1/4 BCx 2 >> preliminary staph species methicillin-resistant 1/4 urine strep >> (-) 1/4 urine legionella >> (-)  Antimicrobials:  1/4 cefepime >> 1/6 1/4 vanc >>1/14 1/6 meropenem >> 1/11 1/9 Diflucan>>>1/22 1/10 Rifampin>>1/14  Interim history/subjective:  O/N Events: RIJ Placed yesterday.  S: Patient able to nod and shake head to answer questions. She states that she is feeling cold this morning. Nursing notified and warm blankets were retrieved.  Objective   Blood pressure (!) 115/58, pulse 71, temperature 98.1 F (36.7 C), temperature source Oral, resp. rate 13, height 5' 10"  (1.778 m), weight 87.9 kg, SpO2 100 %.    Vent Mode: PRVC FiO2 (%):  [30 %] 30 % Set Rate:  [12 bmp] 12 bmp Vt Set:  [480 mL] 480 mL PEEP:  [5 cmH20] 5 cmH20 Plateau Pressure:  [20 cmH20-24 cmH20] 22 cmH20   Intake/Output Summary (Last 24 hours) at 01/07/2020 0615 Last data filed at 01/07/2020 0600 Gross per 24 hour  Intake 1700.33 ml  Output 1260 ml  Net 440.33 ml    Examination: General: Laying comfortably, no acute distress.   HEENT: normocephalic, atraumatic, trach intact, no scleral icterus noted.  Cardio: RRR no murmurs, rubs, or gallops Pulmonary: Clear to auscultation bilaterally, no rales, wheezes, or  rhonchi appreciated.  Abdomen: BS present, non distended, non tender to palpation MSK: Upper extremities swollen and erythematous bilaterally. Bandage over the right forearm no pitting edema noted bilaterally in the lower extremeties Neuro: able to answer questions  appropriately with nodding and shaking of her head Skin: warm and dry.   Resolved Hospital Problem list     Assessment & Plan:   Chronic Respiratory Failure with Tracheostomy and PEG 2/2 to Cervical Epidural Abscess Resulting in Quadriplegia Requiring Ventilation Support:   - Full vent support, no weaning - Current vent settings 30/5/12/480 - Titrate O2 for sat of 88-92% - Maintain current trach size and type  HCAP with Sepsis (Resolved):  - Patient Hemodynamically Stable - Blood Cultures with MRSA - Urine Cultures + E. Coli and Klebsiella - On Vancomycin and Rifampin  - Completed meropenem for e. Coli.  - Continue to follow with Primary   Candidemia: - Continue on Diflucan until 01/10/20  Neutropenia (Resolved):  - Likely 2/2 to sepsis from bacteremia  - WBC 7.6   Caloric Malnutrition:  -Continue PEG tube feeds   Hypernatremia (Resolved):  - Na:135   Acute Kidney Injury (Resolved):  - Cr: 0.82  Best practice:  Diet: NPO, PEG tube feeds resumed Pain/Anxiety/Delirium protocol (if indicated): not needed VAP protocol (if indicated): yes DVT prophylaxis: lovenox GI prophylaxis: PPI Glucose control: CBG q 4, SSI Mobility: PT/ OT Code Status: full  Family Communication: per primary, patient updated on plan of care Disposition: admit [facility]/ PCCM for chronic vent managment  Labs   CBC: Recent Labs  Lab 01/02/20 0708 01/03/20 0531 01/05/20 0301 01/06/20 0350 01/07/20 0428  WBC 4.2 4.3 6.7 7.9 7.6  NEUTROABS 1.8 2.6 3.6 5.0 5.1  HGB 7.7* 7.7* 7.3* 7.5* 7.2*  HCT 26.9* 26.4* 24.4* 25.1* 23.5*  MCV 100.7* 99.2 97.2 96.5 95.9  PLT 196 199 238 219 706    Basic Metabolic Panel: Recent Labs  Lab 01/02/20 0708 01/03/20 0531 01/05/20 0301 01/06/20 0350 01/07/20 0428  NA 149* 144 141 140 135  K 4.4 4.6 4.7 4.9 4.9  CL 110 105 103 103 100  CO2 33* 32 32 32 30  GLUCOSE 146* 105* 133* 72 124*  BUN 98* 98* 97* 94* 95*  CREATININE 0.81 0.73 0.83 0.79 0.82   CALCIUM 9.1 9.0 8.7* 8.7* 8.6*  MG 2.1 2.0 2.2 2.1 2.1   GFR: Estimated Creatinine Clearance: 82.4 mL/min (by C-G formula based on SCr of 0.82 mg/dL). Recent Labs  Lab 01/03/20 0531 01/05/20 0301 01/06/20 0350 01/07/20 0428  WBC 4.3 6.7 7.9 7.6    Liver Function Tests: Recent Labs  Lab 01/01/20 0524 01/03/20 0531 01/05/20 0301 01/07/20 0428  AST 16 16 17  14*  ALT 22 19 21 19   ALKPHOS 170* 169* 188* 160*  BILITOT 0.6 0.5 0.7 0.5  PROT 7.2 6.9 7.0 6.6  ALBUMIN 1.3* 1.3* 1.3* 1.3*   No results for input(s): LIPASE, AMYLASE in the last 168 hours. No results for input(s): AMMONIA in the last 168 hours.  ABG    Component Value Date/Time   PHART 7.420 12/31/2019 1037   PCO2ART 53.5 (H) 12/31/2019 1037   PO2ART 43.0 (L) 12/31/2019 1037   HCO3 34.9 (H) 12/31/2019 1037   TCO2 37 (H) 12/31/2019 1037   O2SAT 80.0 12/31/2019 1037     Coagulation Profile: No results for input(s): INR, PROTIME in the last 168 hours.  Cardiac Enzymes: No results for input(s): CKTOTAL, CKMB, CKMBINDEX, TROPONINI in the last 168 hours.  HbA1C: No results found for: HGBA1C  CBG: Recent Labs  Lab 01/06/20 1152 01/06/20 1534 01/06/20 1952 01/06/20 2349 01/07/20 [place]   [name], MD IMTS, PGY-1 Pager: [phone] Please see Attending Note for Final Recommendations.

## 2020-01-07 NOTE — Progress Notes (Signed)
Hypoglycemic Event  CBG: 57  Treatment: D50 25 mL (12.5 gm)  Symptoms: None  Follow-up CBG: Time: 833 CBG Result: 112  Possible Reasons for Event: Inadequate meal intake  Comments/MD notified: Found tube feeds turned off upon assessment. Will update MD.    Gabrielle Wade

## 2020-03-16 MED ORDER — CARBOXYMETHYLCELLULOSE SODIUM 0.5 % OP SOLN
1.00 | OPHTHALMIC | Status: DC
Start: ? — End: 2020-03-16

## 2020-03-16 MED ORDER — LEVOTHYROXINE SODIUM 25 MCG PO TABS
25.00 | ORAL_TABLET | ORAL | Status: DC
Start: 2020-03-17 — End: 2020-03-16

## 2020-03-16 MED ORDER — LIDOCAINE 4 % EX PTCH
1.00 | MEDICATED_PATCH | CUTANEOUS | Status: DC
Start: 2020-03-16 — End: 2020-03-16

## 2020-03-16 MED ORDER — INSULIN GLARGINE 100 UNIT/ML ~~LOC~~ SOLN
4.00 | SUBCUTANEOUS | Status: DC
Start: 2020-03-16 — End: 2020-03-16

## 2020-03-16 MED ORDER — DEXTROSE 10 % IV SOLN
125.00 | INTRAVENOUS | Status: DC
Start: ? — End: 2020-03-16

## 2020-03-16 MED ORDER — BUDESONIDE 0.5 MG/2ML IN SUSP
0.50 | RESPIRATORY_TRACT | Status: DC
Start: 2020-03-16 — End: 2020-03-16

## 2020-03-16 MED ORDER — OXYCODONE HCL 5 MG/5ML PO SOLN
2.50 | ORAL | Status: DC
Start: ? — End: 2020-03-16

## 2020-03-16 MED ORDER — LORAZEPAM 1 MG PO TABS
1.00 | ORAL_TABLET | ORAL | Status: DC
Start: 2020-03-16 — End: 2020-03-16

## 2020-03-16 MED ORDER — TRIAMCINOLONE ACETONIDE 0.1 % EX CREA
TOPICAL_CREAM | CUTANEOUS | Status: DC
Start: 2020-03-16 — End: 2020-03-16

## 2020-03-16 MED ORDER — DULOXETINE HCL 30 MG PO CPEP
30.00 | ORAL_CAPSULE | ORAL | Status: DC
Start: 2020-03-17 — End: 2020-03-16

## 2020-03-16 MED ORDER — FERROUS SULFATE 75 (15 FE) MG/ML PO SOLN
15.00 | ORAL | Status: DC
Start: 2020-03-17 — End: 2020-03-16

## 2020-03-16 MED ORDER — ACETAMINOPHEN 500 MG PO TABS
1000.00 | ORAL_TABLET | ORAL | Status: DC
Start: ? — End: 2020-03-16

## 2020-03-16 MED ORDER — LOPERAMIDE HCL 2 MG PO CAPS
2.00 | ORAL_CAPSULE | ORAL | Status: DC
Start: ? — End: 2020-03-16

## 2020-03-16 MED ORDER — TRAZODONE HCL 50 MG PO TABS
50.00 | ORAL_TABLET | ORAL | Status: DC
Start: 2020-03-16 — End: 2020-03-16

## 2020-03-16 MED ORDER — CHLORHEXIDINE GLUCONATE 0.12 % MT SOLN
15.00 | OROMUCOSAL | Status: DC
Start: ? — End: 2020-03-16

## 2020-03-16 MED ORDER — APIXABAN 5 MG PO TABS
5.00 | ORAL_TABLET | ORAL | Status: DC
Start: 2020-03-16 — End: 2020-03-16

## 2020-03-16 MED ORDER — BISACODYL 10 MG RE SUPP
10.00 | RECTAL | Status: DC
Start: ? — End: 2020-03-16

## 2020-03-16 MED ORDER — IPRATROPIUM-ALBUTEROL 0.5-2.5 (3) MG/3ML IN SOLN
3.00 | RESPIRATORY_TRACT | Status: DC
Start: ? — End: 2020-03-16

## 2020-03-16 MED ORDER — CHLORHEXIDINE GLUCONATE 0.12 % MT SOLN
15.00 | OROMUCOSAL | Status: DC
Start: 2020-03-16 — End: 2020-03-16

## 2020-03-16 MED ORDER — SENNOSIDES-DOCUSATE SODIUM 8.6-50 MG PO TABS
2.00 | ORAL_TABLET | ORAL | Status: DC
Start: ? — End: 2020-03-16

## 2020-03-16 MED ORDER — CHOLECALCIFEROL 25 MCG (1000 UT) PO TABS
1000.00 | ORAL_TABLET | ORAL | Status: DC
Start: 2020-03-17 — End: 2020-03-16

## 2020-03-16 MED ORDER — GENERIC EXTERNAL MEDICATION
7.50 | Status: DC
Start: 2020-03-17 — End: 2020-03-16

## 2020-03-16 MED ORDER — ONDANSETRON HCL 4 MG PO TABS
4.00 | ORAL_TABLET | ORAL | Status: DC
Start: ? — End: 2020-03-16

## 2020-03-16 MED ORDER — DERMACERIN EX CREA
TOPICAL_CREAM | CUTANEOUS | Status: DC
Start: ? — End: 2020-03-16

## 2020-03-16 MED ORDER — GLUCOSE 40 % PO GEL
15.00 | ORAL | Status: DC
Start: ? — End: 2020-03-16

## 2020-03-16 MED ORDER — INSULIN LISPRO 100 UNIT/ML ~~LOC~~ SOLN
2.00 | SUBCUTANEOUS | Status: DC
Start: 2020-03-16 — End: 2020-03-16

## 2020-03-16 MED ORDER — PRO-STAT PO LIQD
ORAL | Status: DC
Start: 2020-03-17 — End: 2020-03-16

## 2020-03-16 MED ORDER — FAMOTIDINE 20 MG PO TABS
20.00 | ORAL_TABLET | ORAL | Status: DC
Start: 2020-03-17 — End: 2020-03-16

## 2020-03-16 MED ORDER — ARFORMOTEROL TARTRATE 15 MCG/2ML IN NEBU
15.00 | INHALATION_SOLUTION | RESPIRATORY_TRACT | Status: DC
Start: 2020-03-16 — End: 2020-03-16

## 2020-03-16 MED ORDER — MIRTAZAPINE 15 MG PO TABS
15.00 | ORAL_TABLET | ORAL | Status: DC
Start: 2020-03-16 — End: 2020-03-16

## 2020-03-16 MED ORDER — GENERIC EXTERNAL MEDICATION
Status: DC
Start: ? — End: 2020-03-16

## 2020-03-16 MED ORDER — IPRATROPIUM-ALBUTEROL 0.5-2.5 (3) MG/3ML IN SOLN
3.00 | RESPIRATORY_TRACT | Status: DC
Start: 2020-03-16 — End: 2020-03-16

## 2020-05-19 DEATH — deceased

## 2021-09-23 IMAGING — DX DG FOOT 2V*L*
2 series · 2 of 2 positions shown · non-contrast
Comparison: None.

CLINICAL DATA: 63-year-old female with bacteremia and left heel
wound. Query osteomyelitis.

EXAM:
LEFT FOOT - 2 VIEW

[foot ap]
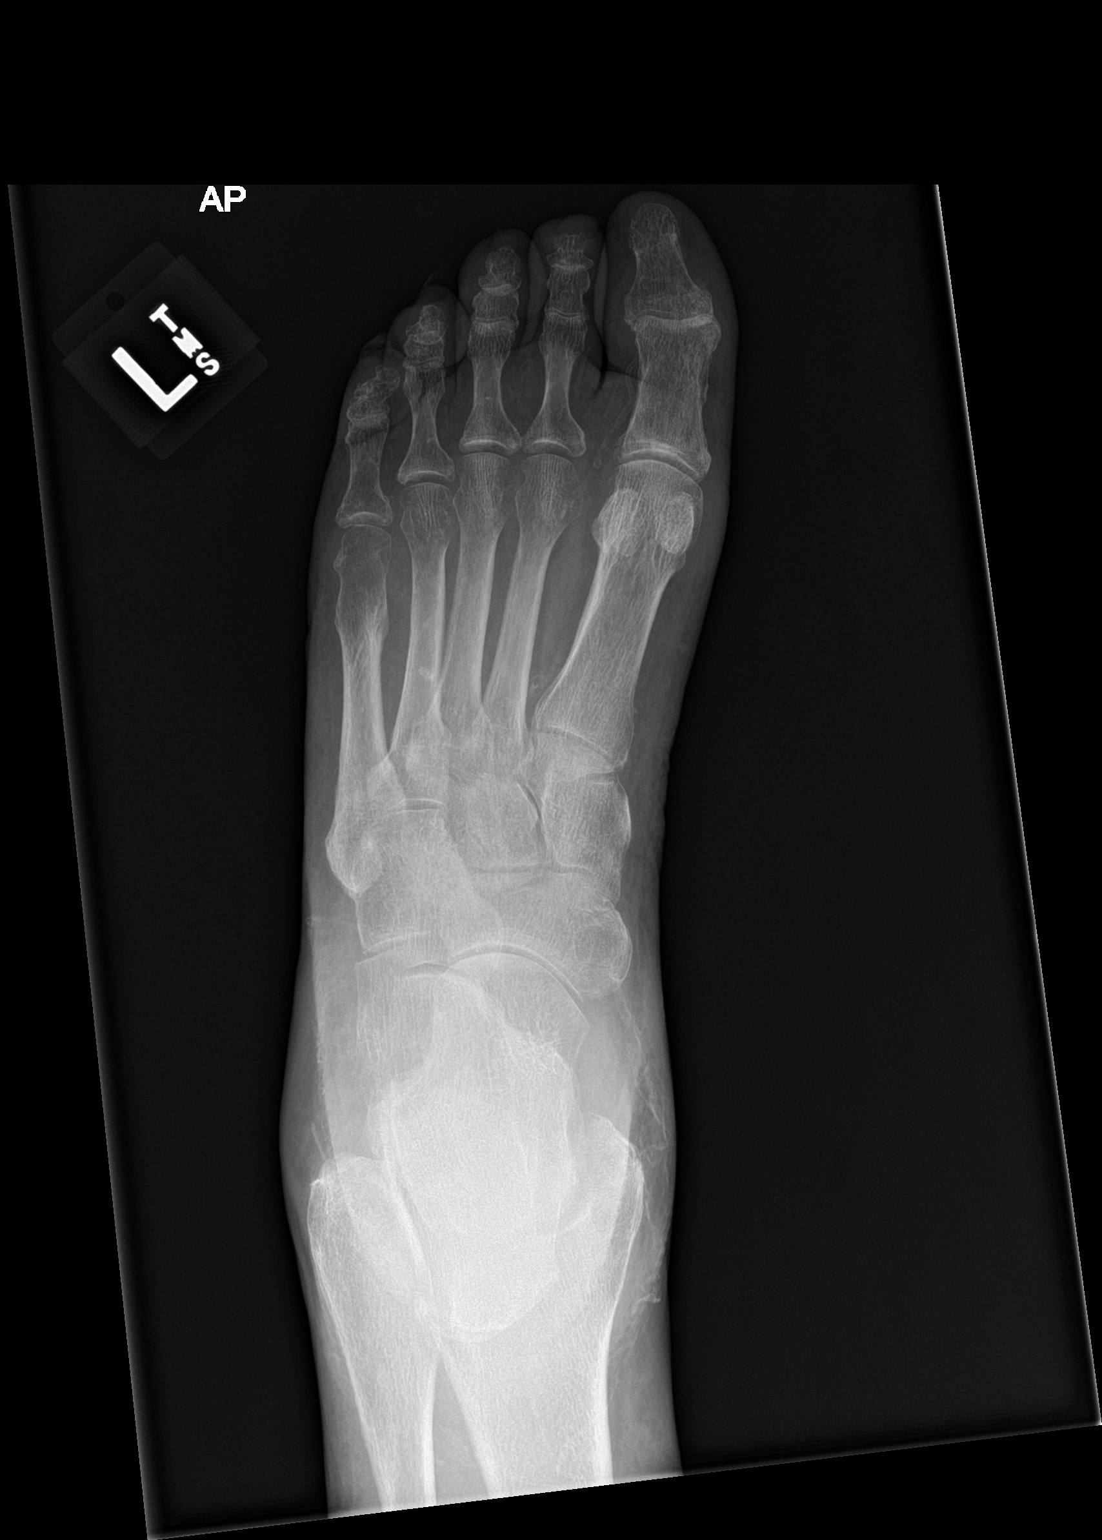

[foot lat]
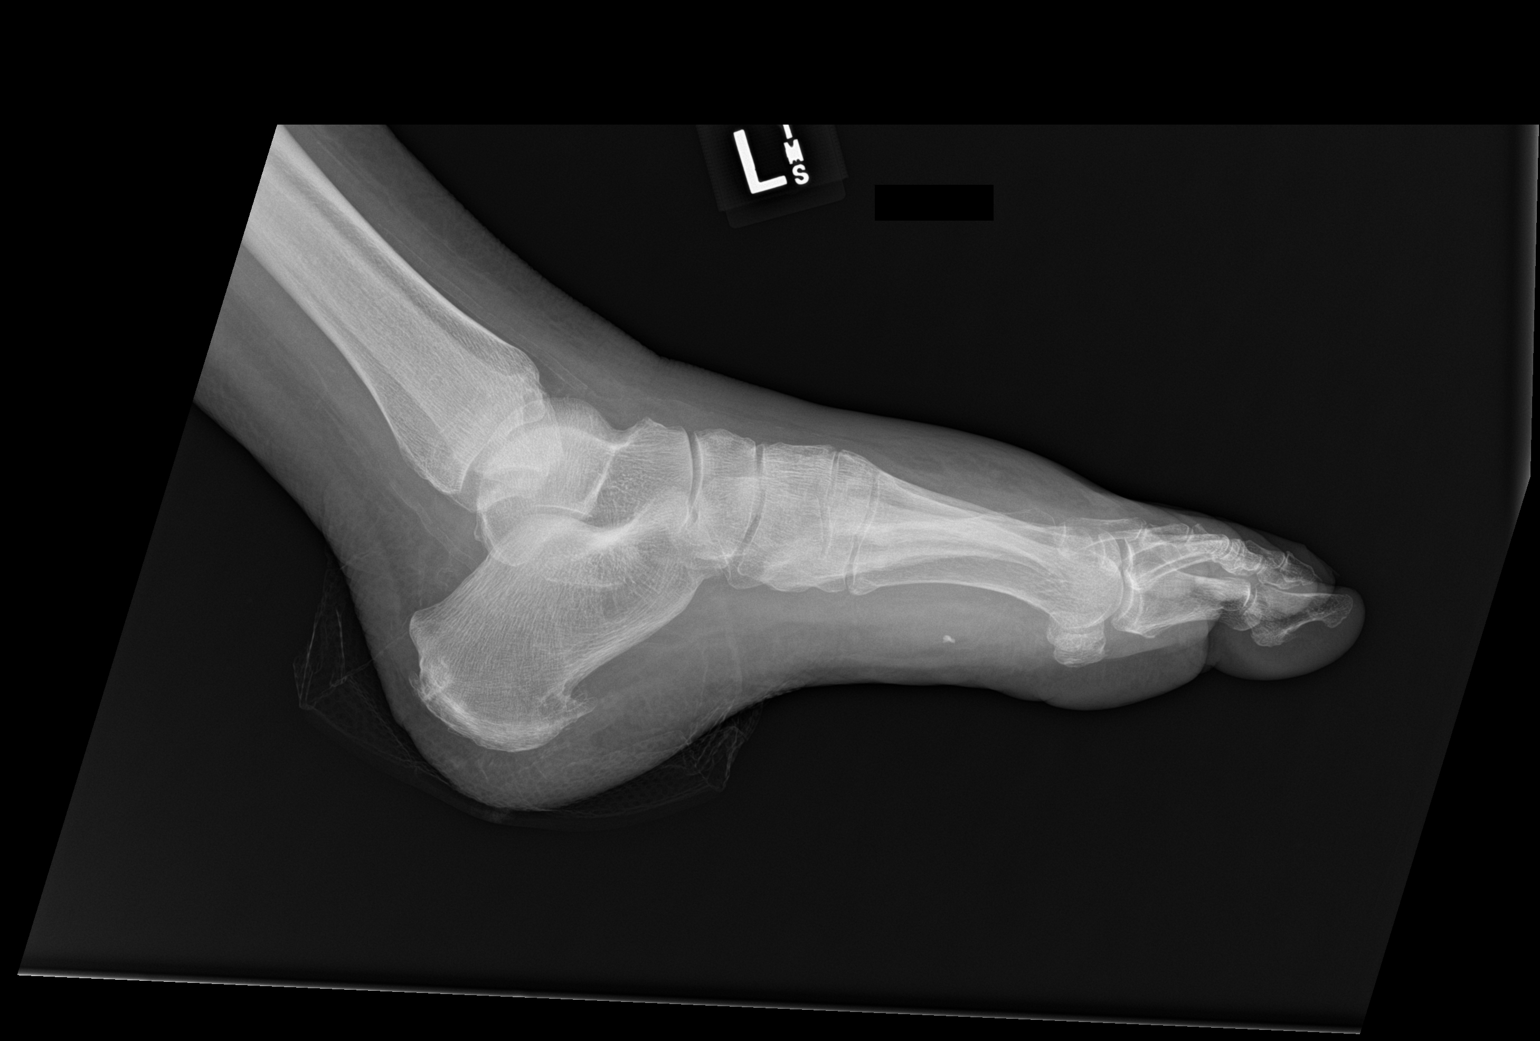

[2 of 2 positions shown; findings below may reference images not displayed]

FINDINGS: The calcaneus appears intact with degenerative spurring. There is
soft tissue swelling in the left foot maximal at the metatarsal
region. Calcified peripheral vascular disease. No soft tissue gas.
There is a tiny 2-3 millimeter nonspecific density or dystrophic
calcification along the plantar surface of the foot at the level of
the metatarsals (arrow). No acute fracture or cortical osteolysis
identified. There is a healed fracture of the 5th metatarsal.
IMPRESSION: 1. Soft tissue swelling with no plain radiographic evidence of
osteomyelitis.
2. Calcified peripheral vascular disease. Tiny 2-3 millimeter
nonspecific foreign body or dystrophic calcification in the plantar
soft tissues (arrow).
3. Healed 5th metatarsal fracture.

## 2021-09-26 IMAGING — DX DG CHEST 1V PORT
1 series · 1 of 1 positions shown · non-contrast
Comparison: Chest CT 12/24/2019 and chest radiograph 12/23/2019

CLINICAL DATA: Respiratory crackles.

EXAM:
PORTABLE CHEST 1 VIEW

[chest ap]
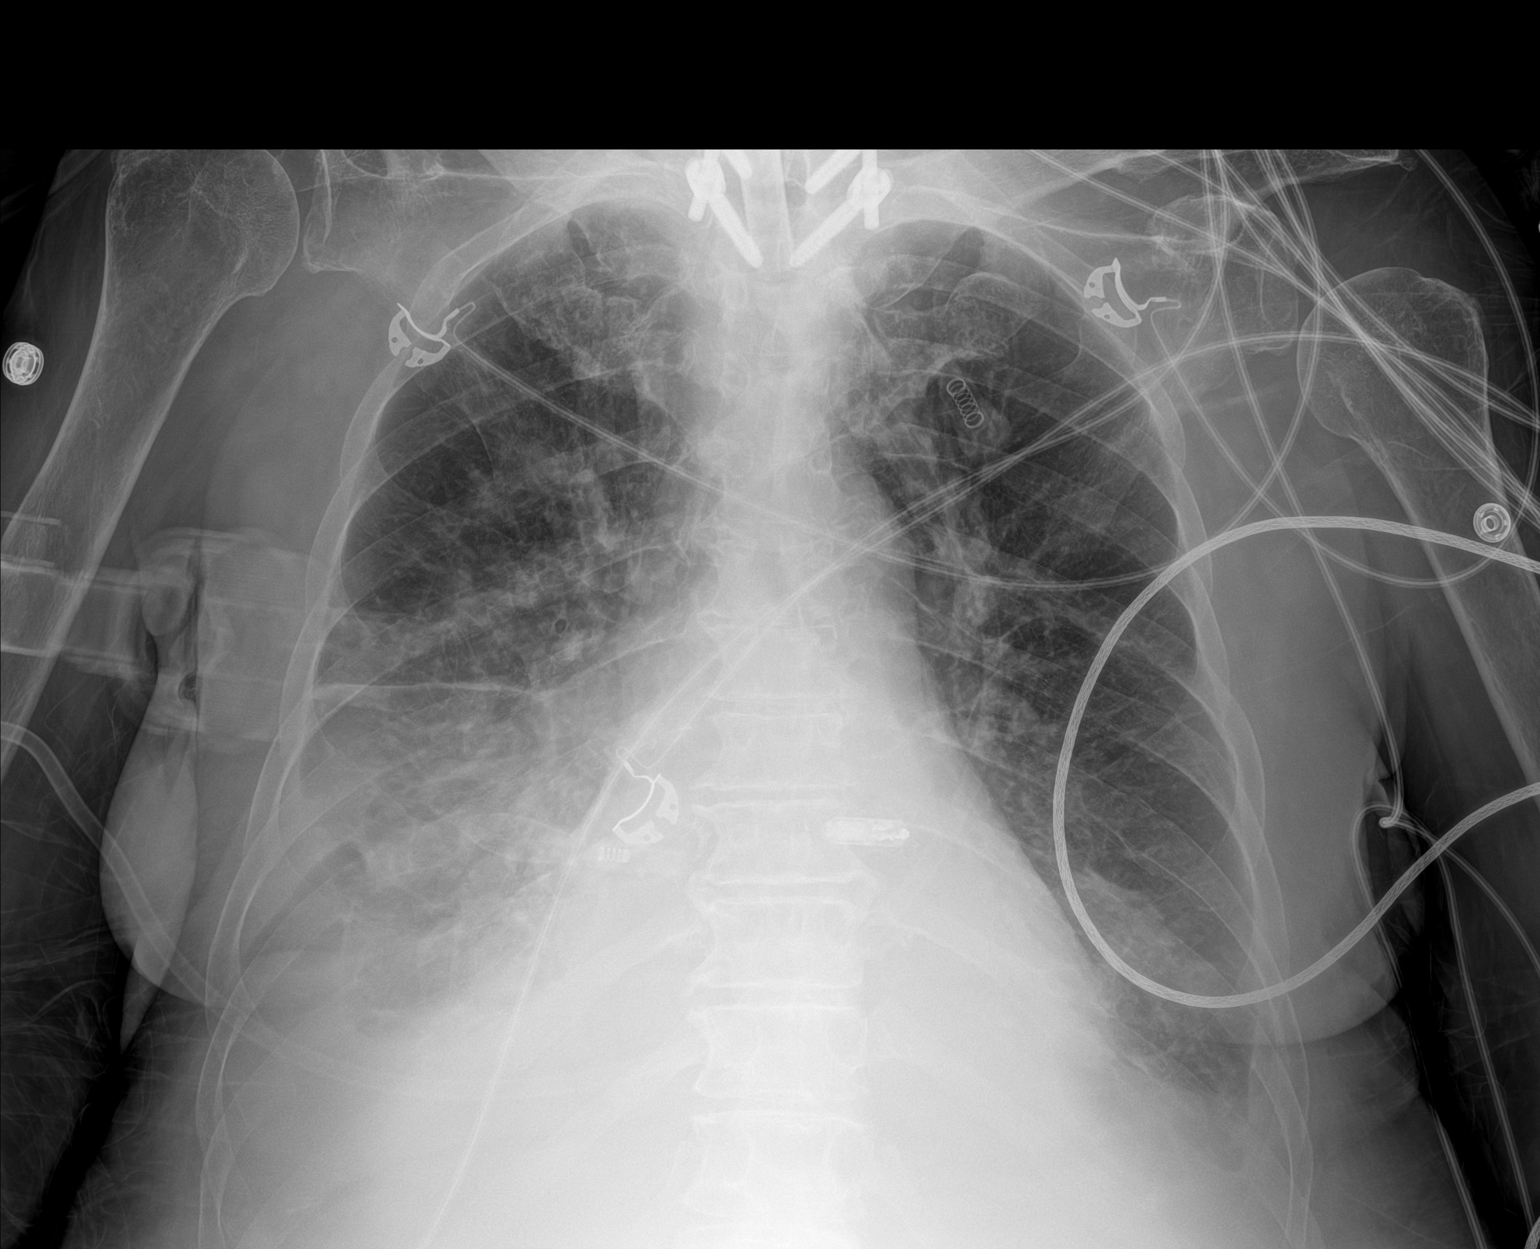

[1 of 1 positions shown; findings below may reference images not displayed]

FINDINGS: Tracheostomy tube overlies the airway. The cardiac silhouette
remains mildly enlarged. Veiling opacities in both lower lungs are
similar to the prior radiograph and consistent with right larger
than left pleural effusions. Patchy airspace opacities in the
perihilar regions and upper lobes have improved. Right greater than
left lower lung airspace opacities have at most slightly improved on
the left. No pneumothorax is identified.
IMPRESSION: 1. Persistent right larger than left pleural effusions with basilar
atelectasis or consolidation.
2. Mildly improved perihilar and upper lung opacities which may
reflect decreased edema or pneumonia.

## 2021-10-04 IMAGING — US US EXTREM UP *R* LTD
1 series · 13 of 13 positions shown · non-contrast
Comparison: None.

CLINICAL DATA: Right upper extremity swelling, evaluate for abscess

EXAM:
ULTRASOUND RIGHT UPPER EXTREMITY LIMITED
TECHNIQUE: Ultrasound examination of the upper extremity soft tissues was
performed in the area of clinical concern.

[Series 1: us extrem up *right* ltd · 13 acquisitions, 13 frames shown]
[im 1/13]
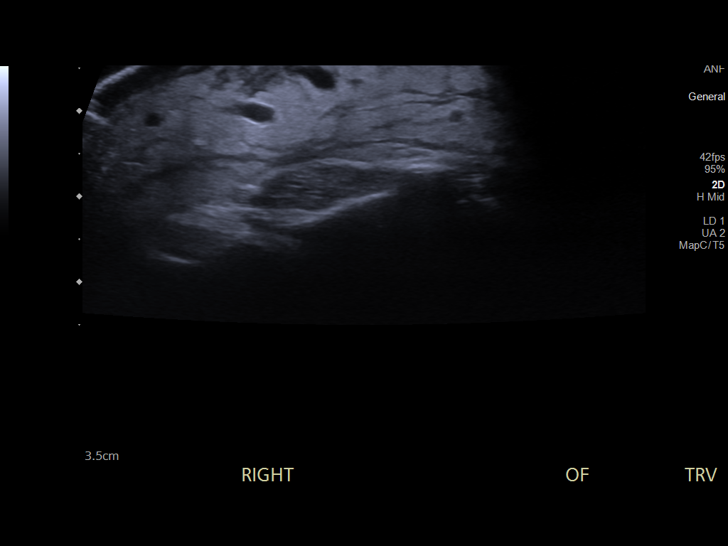
[im 2/13]
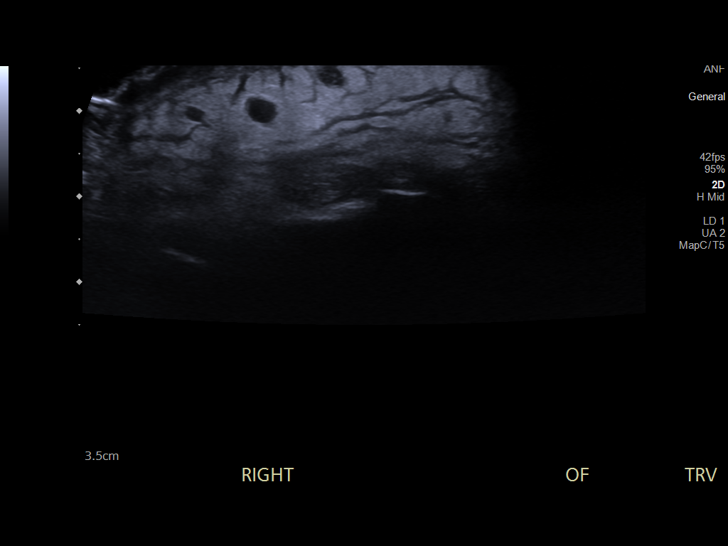
[im 3/13]
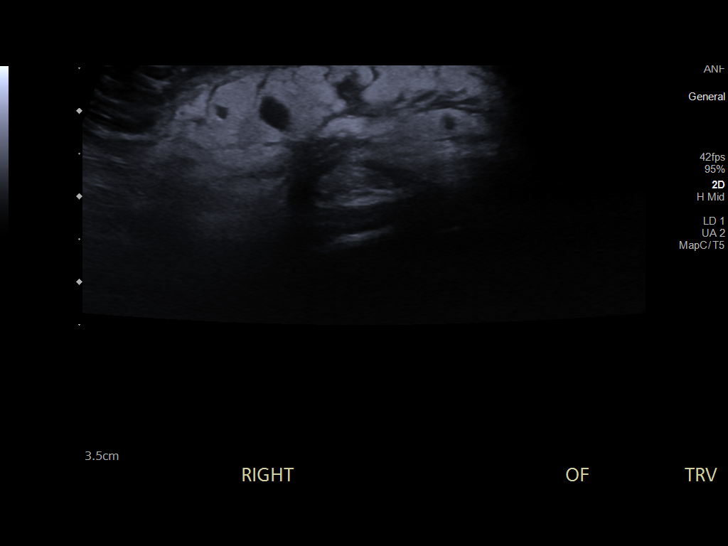
[im 4/13]
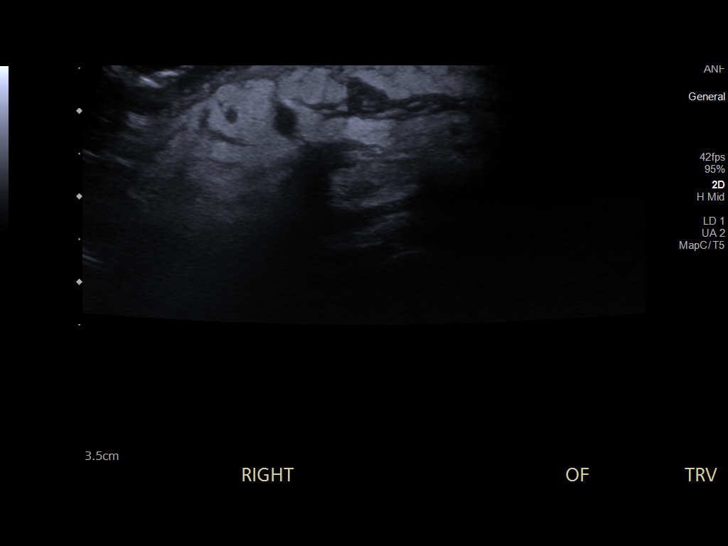
[im 5/13]
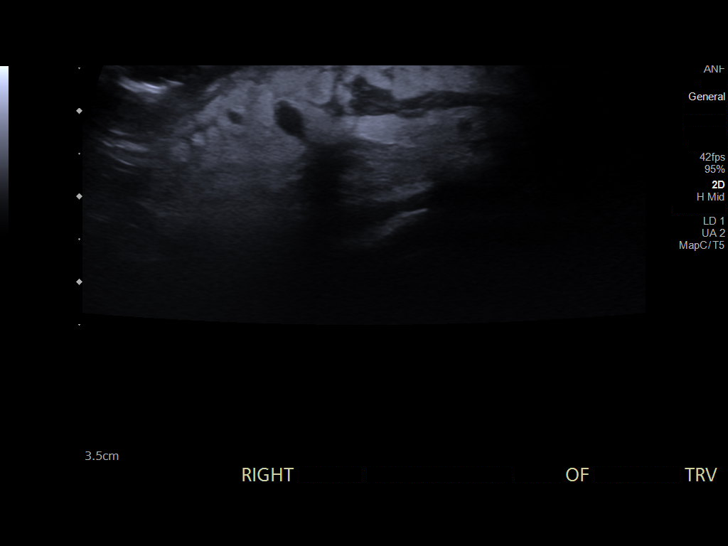
[im 6/13]
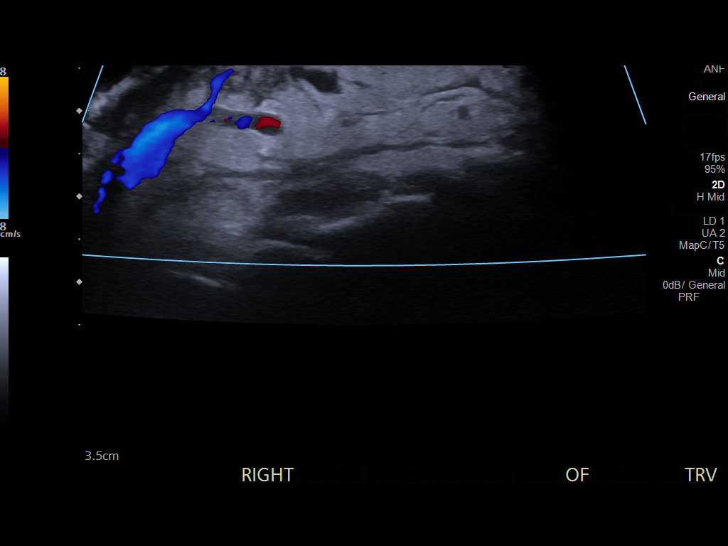
[im 7/13]
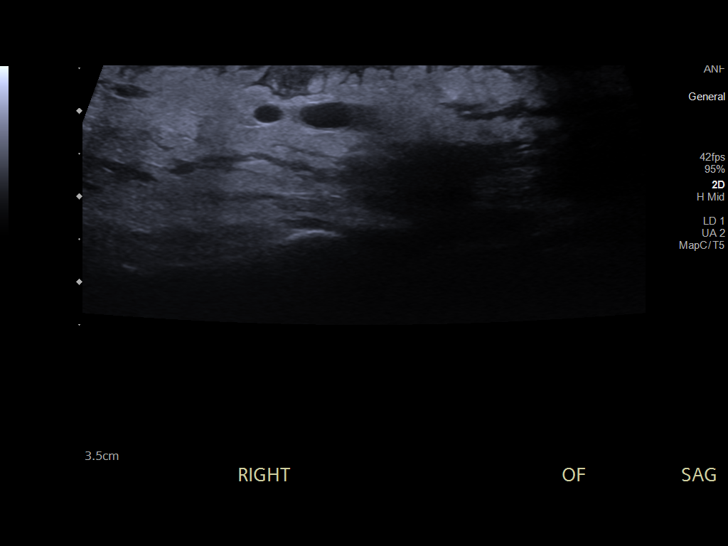
[im 8/13]
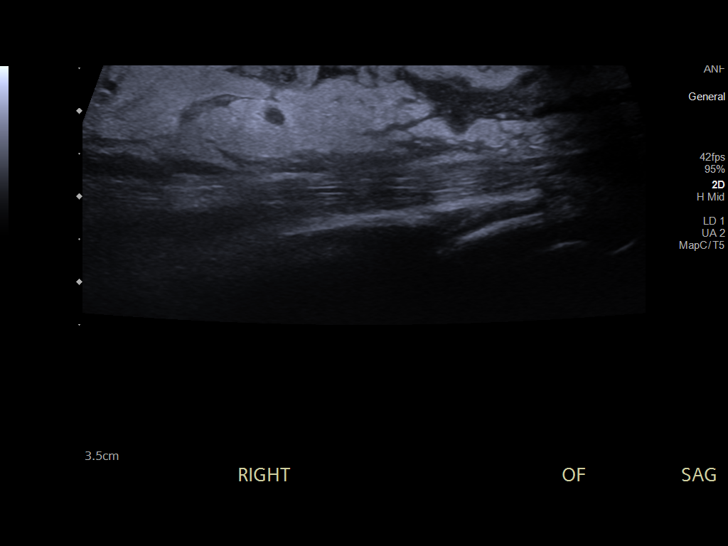
[im 9/13]
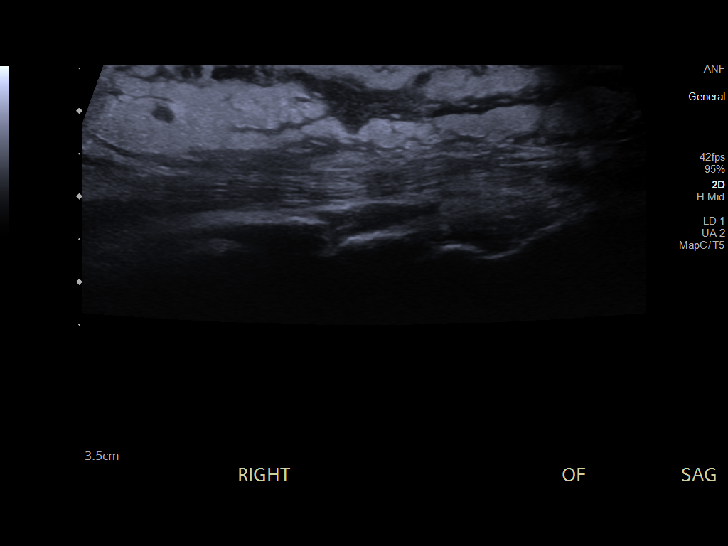
[im 10/13]
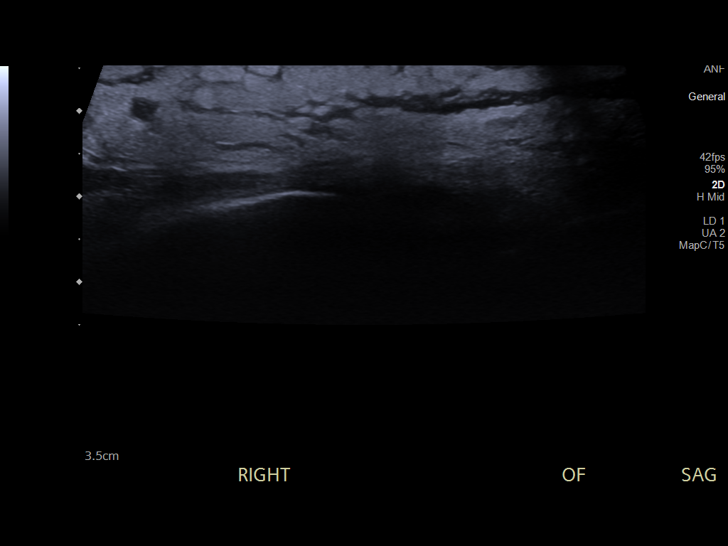
[im 11/13]
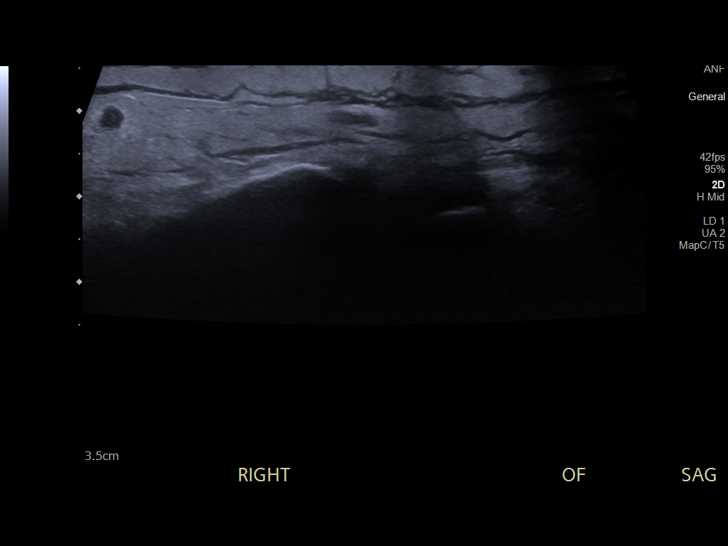
[im 12/13]
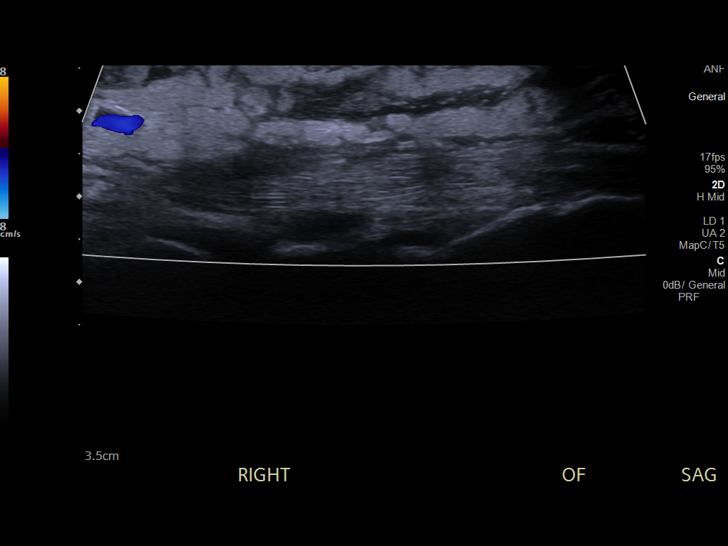
[im 13/13]
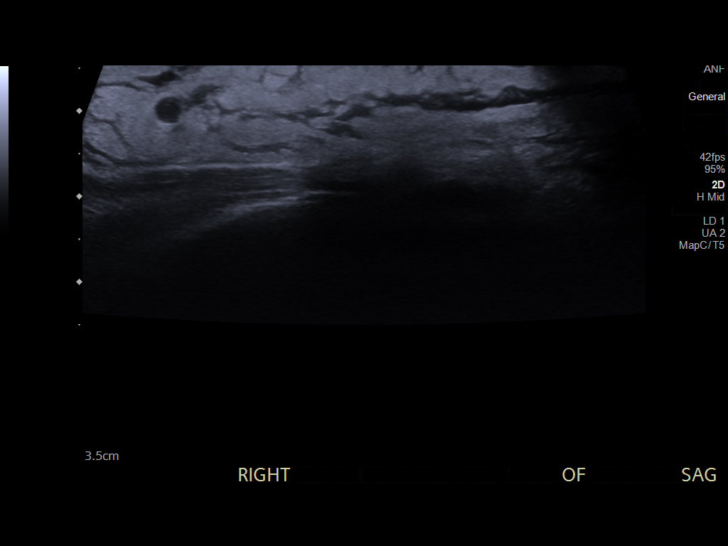

[13 of 13 positions shown; findings below may reference images not displayed]

FINDINGS: Within the distal aspect of the right forearm, there is marked soft
tissue swelling and subcutaneous edema. At the area of clinical
concern, there is a more focal area of ill-defined complex fluid
within the subcutaneous tissues. No thickened, well-defined wall. No
sonographic evidence of soft tissue gas.
IMPRESSION: Extensive soft tissue swelling and edema at the right distal forearm
with ill-defined fluid at the site of clinical concern suggestive of
phlegmonous change/developing abscess. No well-defined wall to
suggest a drainable component at this time.
